# Patient Record
Sex: Female | Born: 1965 | Race: White | Hispanic: No | Marital: Married | Smoking: Never smoker
Health system: Southern US, Community
[De-identification: ages and names within clinical notes are randomized; demographics above are authoritative.]

## PROBLEM LIST (undated history)

## (undated) DIAGNOSIS — R1031 Right lower quadrant pain: Secondary | ICD-10-CM

## (undated) DIAGNOSIS — N83201 Unspecified ovarian cyst, right side: Secondary | ICD-10-CM

## (undated) HISTORY — PX: NASAL SINUS SURGERY: SHX719

## (undated) HISTORY — PX: OTHER SURGICAL HISTORY: SHX169

---
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
## 1988-09-06 HISTORY — PX: CHOLECYSTECTOMY: SHX55

## 1996-09-06 HISTORY — PX: DIAGNOSTIC LAPAROSCOPY: SUR761

## 1997-09-06 HISTORY — PX: VAGINAL HYSTERECTOMY: SUR661

## 1999-07-27 ENCOUNTER — Other Ambulatory Visit: Admission: RE | Admit: 1999-07-27 | Discharge: 1999-07-27 | Payer: Self-pay | Admitting: Obstetrics and Gynecology

## 1999-09-07 HISTORY — PX: PUBOVAGINAL SLING: SHX1035

## 2000-02-16 ENCOUNTER — Inpatient Hospital Stay (HOSPITAL_COMMUNITY): Admission: RE | Admit: 2000-02-16 | Discharge: 2000-02-17 | Payer: Self-pay | Admitting: Obstetrics and Gynecology

## 2001-05-25 ENCOUNTER — Encounter: Admission: RE | Admit: 2001-05-25 | Discharge: 2001-05-25 | Payer: Self-pay | Admitting: Otolaryngology

## 2001-05-25 ENCOUNTER — Encounter: Payer: Self-pay | Admitting: Otolaryngology

## 2002-09-06 HISTORY — PX: OTHER SURGICAL HISTORY: SHX169

## 2002-12-17 ENCOUNTER — Other Ambulatory Visit: Admission: RE | Admit: 2002-12-17 | Discharge: 2002-12-17 | Payer: Self-pay | Admitting: Obstetrics and Gynecology

## 2003-09-13 ENCOUNTER — Encounter: Admission: RE | Admit: 2003-09-13 | Discharge: 2003-09-13 | Payer: Self-pay | Admitting: Otolaryngology

## 2004-01-07 ENCOUNTER — Other Ambulatory Visit: Admission: RE | Admit: 2004-01-07 | Discharge: 2004-01-07 | Payer: Self-pay | Admitting: Obstetrics and Gynecology

## 2004-09-15 ENCOUNTER — Emergency Department (HOSPITAL_COMMUNITY): Admission: EM | Admit: 2004-09-15 | Discharge: 2004-09-15 | Payer: Self-pay | Admitting: Emergency Medicine

## 2005-04-22 ENCOUNTER — Encounter (INDEPENDENT_AMBULATORY_CARE_PROVIDER_SITE_OTHER): Payer: Self-pay | Admitting: Specialist

## 2005-04-22 ENCOUNTER — Ambulatory Visit (HOSPITAL_BASED_OUTPATIENT_CLINIC_OR_DEPARTMENT_OTHER): Admission: RE | Admit: 2005-04-22 | Discharge: 2005-04-22 | Payer: Self-pay | Admitting: Orthopedic Surgery

## 2005-04-22 ENCOUNTER — Ambulatory Visit (HOSPITAL_COMMUNITY): Admission: RE | Admit: 2005-04-22 | Discharge: 2005-04-22 | Payer: Self-pay | Admitting: Orthopedic Surgery

## 2006-08-25 ENCOUNTER — Emergency Department (HOSPITAL_COMMUNITY): Admission: EM | Admit: 2006-08-25 | Discharge: 2006-08-25 | Payer: Self-pay | Admitting: Emergency Medicine

## 2006-09-06 HISTORY — PX: OTHER SURGICAL HISTORY: SHX169

## 2007-09-07 HISTORY — PX: OTHER SURGICAL HISTORY: SHX169

## 2007-12-12 ENCOUNTER — Ambulatory Visit: Payer: Self-pay | Admitting: Family Medicine

## 2007-12-12 DIAGNOSIS — E669 Obesity, unspecified: Secondary | ICD-10-CM | POA: Insufficient documentation

## 2007-12-13 ENCOUNTER — Telehealth: Payer: Self-pay | Admitting: Family Medicine

## 2007-12-19 ENCOUNTER — Telehealth: Payer: Self-pay | Admitting: Family Medicine

## 2007-12-21 ENCOUNTER — Ambulatory Visit: Payer: Self-pay | Admitting: Family Medicine

## 2007-12-22 ENCOUNTER — Telehealth: Payer: Self-pay | Admitting: Family Medicine

## 2007-12-26 ENCOUNTER — Telehealth: Payer: Self-pay | Admitting: Family Medicine

## 2007-12-28 LAB — CONVERTED CEMR LAB
Basophils Absolute: 0.1 10*3/uL (ref 0.0–0.1)
Basophils Relative: 0.6 % (ref 0.0–1.0)
Eosinophils Absolute: 0.2 10*3/uL (ref 0.0–0.7)
Eosinophils Relative: 1.6 % (ref 0.0–5.0)
HCT: 43 % (ref 36.0–46.0)
Hemoglobin: 14.3 g/dL (ref 12.0–15.0)
Lymphocytes Relative: 27.5 % (ref 12.0–46.0)
MCHC: 33.3 g/dL (ref 30.0–36.0)
MCV: 89.9 fL (ref 78.0–100.0)
Monocytes Absolute: 1.1 10*3/uL — ABNORMAL HIGH (ref 0.1–1.0)
Monocytes Relative: 9.9 % (ref 3.0–12.0)
Neutro Abs: 6.6 10*3/uL (ref 1.4–7.7)
Neutrophils Relative %: 60.4 % (ref 43.0–77.0)
Platelets: 549 10*3/uL — ABNORMAL HIGH (ref 150–400)
RBC: 4.78 M/uL (ref 3.87–5.11)
RDW: 13.7 % (ref 11.5–14.6)
WBC: 11.1 10*3/uL — ABNORMAL HIGH (ref 4.5–10.5)

## 2008-01-01 ENCOUNTER — Telehealth: Payer: Self-pay | Admitting: Family Medicine

## 2008-01-03 ENCOUNTER — Encounter: Payer: Self-pay | Admitting: Family Medicine

## 2008-02-21 ENCOUNTER — Ambulatory Visit: Payer: Self-pay | Admitting: Family Medicine

## 2008-02-21 DIAGNOSIS — Z9884 Bariatric surgery status: Secondary | ICD-10-CM | POA: Insufficient documentation

## 2008-05-29 ENCOUNTER — Telehealth: Payer: Self-pay | Admitting: Family Medicine

## 2008-05-30 ENCOUNTER — Encounter: Payer: Self-pay | Admitting: Family Medicine

## 2008-07-29 ENCOUNTER — Encounter: Payer: Self-pay | Admitting: Family Medicine

## 2009-08-04 ENCOUNTER — Encounter: Payer: Self-pay | Admitting: Family Medicine

## 2010-07-15 ENCOUNTER — Telehealth: Payer: Self-pay | Admitting: Family Medicine

## 2010-10-04 LAB — CONVERTED CEMR LAB
ALT: 16 units/L (ref 0–35)
AST: 15 units/L (ref 0–37)
Albumin: 3.7 g/dL (ref 3.5–5.2)
Alkaline Phosphatase: 84 units/L (ref 39–117)
BUN: 11 mg/dL (ref 6–23)
Basophils Absolute: 0.1 10*3/uL (ref 0.0–0.1)
Basophils Relative: 0.9 % (ref 0.0–1.0)
Bilirubin, Direct: 0.1 mg/dL (ref 0.0–0.3)
CO2: 29 meq/L (ref 19–32)
Calcium: 9.2 mg/dL (ref 8.4–10.5)
Chloride: 107 meq/L (ref 96–112)
Cholesterol: 182 mg/dL (ref 0–200)
Creatinine, Ser: 0.8 mg/dL (ref 0.4–1.2)
Eosinophils Absolute: 0.1 10*3/uL (ref 0.0–0.7)
Eosinophils Relative: 0.9 % (ref 0.0–5.0)
GFR calc Af Amer: 102 mL/min
GFR calc non Af Amer: 84 mL/min
Glucose, Bld: 91 mg/dL (ref 70–99)
HCT: 42.5 % (ref 36.0–46.0)
HDL: 32 mg/dL — ABNORMAL LOW (ref >39.0)
Hemoglobin: 14.1 g/dL (ref 12.0–15.0)
LDL Cholesterol: 131 mg/dL — ABNORMAL HIGH (ref 0–99)
Lymphocytes Relative: 30.2 % (ref 12.0–46.0)
MCHC: 33.1 g/dL (ref 30.0–36.0)
MCV: 87.9 fL (ref 78.0–100.0)
Monocytes Absolute: 1.2 10*3/uL — ABNORMAL HIGH (ref 0.1–1.0)
Monocytes Relative: 9.9 % (ref 3.0–12.0)
Neutro Abs: 7.3 10*3/uL (ref 1.4–7.7)
Neutrophils Relative %: 58.1 % (ref 43.0–77.0)
Phosphorus: 4.2 mg/dL (ref 2.3–4.6)
Platelets: 527 10*3/uL — ABNORMAL HIGH (ref 150–400)
Potassium: 5 meq/L (ref 3.5–5.1)
RBC: 4.84 M/uL (ref 3.87–5.11)
RDW: 13.9 % (ref 11.5–14.6)
Sodium: 140 meq/L (ref 135–145)
TSH: 1.16 microintl units/mL (ref 0.35–5.50)
Total Bilirubin: 0.7 mg/dL (ref 0.3–1.2)
Total CHOL/HDL Ratio: 5.7
Total Protein: 7.1 g/dL (ref 6.0–8.3)
Triglycerides: 94 mg/dL (ref 0–149)
VLDL: 19 mg/dL (ref 0–40)
WBC: 12.5 10*3/uL — ABNORMAL HIGH (ref 4.5–10.5)

## 2010-10-06 NOTE — Progress Notes (Signed)
Summary: exemption flu inj   Phone Note Call from Patient   Caller: Patient   Summary of Call: pls call her at [phone] she works at [employer] and needs flu shot exemption Initial call taken by: Pura Spice, RN,  July 15, 2010 9:13 AM  Follow-up for Phone Call        I cannot do this since I  have never met her. She can resubmit the letter Dr. Scotty Court dictated for her in 2009.  Follow-up by: Nelwyn Salisbury MD,  July 15, 2010 10:58 AM  Additional Follow-up for Phone Call Additional follow up Details #1::        left mess for pt to return call  Additional Follow-up by: Pura Spice, RN,  July 15, 2010 11:01 AM    Additional Follow-up for Phone Call Additional follow up Details #2::    pt notified and mess given to her stated she would ask someone else in HR if can use the same letter.  Pt advised to call back if we can assit her anymore.  Follow-up by: Pura Spice, RN,  July 15, 2010 5:16 PM

## 2010-10-06 NOTE — Letter (Signed)
[facility]: [facility]   Imported By: Maryln Gottron 09/10/2009 13:58:05  _____________________________________________________________________  External Attachment:    Type:   Image     Comment:   External Document

## 2011-01-22 NOTE — H&P (Signed)
[facility]  Patient:    Sydney [name], Sydney [name]                       MRN: [id number] Dictator:   Juluis Mire, M.D.                         History and Physical  HISTORY OF PRESENT ILLNESS:  This is a 45 year old gravida 2 para 3 white female presents for open laparoscopy and attempted cuff revision per management of deep dyspareunia.  In relation to the present admission, patient undergone a previous total vaginal hysterectomy in December of 1998 with a finding of uterine adenomyosis.  She was doing well after that and developed increasing deep dyspareunia.  Ultrasound evaluation was basically unremarkable.  She continued to have cuff tenderness located along the top of the vagina.  We did inject his area with Marcaine and dexamethasone without response.  She has been evaluated by a urologist who felt that she did not have interstitial cystitis. She does have stress urinary incontinence at the present time.  Dr. Logan Bores is planning to do a sling procedure.  In view of her continued cuff tenderness, we are going to do a diagnostic laparoscopy in an attempt at a cuff revision.  ALLERGIES:  CODEINE.  MEDICATIONS:  None.  PAST MEDICAL HISTORY:  Old childhood diseases.  She has been diagnosed with fibromyalgia in the past.  PAST SURGICAL HISTORY:  She had a splenectomy at age 67.  She had a cholecystectomy in the recent past.  She has had wrist, elbow and sinus surgery.  Also in October of 1997, she underwent diagnostic laparoscopy and subsequently had a noted total vaginal hysterectomy.  FAMILY HISTORY:  Noncontributory.  SOCIAL HISTORY:  Noncontributory.  REVIEW OF SYSTEMS:  Noncontributory.  PHYSICAL EXAMINATION:  VITAL SIGNS:  ______  HEENT:  Normocephalic.  Pupils equal, round and reactive to accommodation. Extraocular movements are intact.  Sclera and conjunctiva clear.  Oropharynx clear without thyromegaly.  BREASTS:  Nodes, good  masses.  LUNGS:  Clear.  CARDIAC:  Regular rhythm and rate without murmurs or gallops.  ABDOMEN:  Benign.  PELVIC:  Normal external genitalia.  Vaginal mucosa is clear.  Cuff well healed.  Does have diffuse cuff tenderness and bladder base tenderness.  No palpable abnormalities are noted.  RECTOVAGINAL:  Exam not performed.  EXTREMITIES:  Trace edema.  NEUROLOGICAL:  ______  IMPRESSION:  Deep dyspareunia at vaginal cuff.  PLAN:  The patient will undergo open laparoscopy as well as attempt at cuff revision, depending upon the findings.  The risks of surgery have been discussed, including the persistence of pain during intercourse, the risk of infection, the risk of vascular injury that could lead to hemorrhage requiring transfusion with the risk of AIDS or hepatitis, the risk of injury to adjacent organs, such as bladder or bowel that could require further exploratory surgery, risks of deep vein thrombosis and pulmonary emboli.  The patient expresses understanding and indication of risks. DD:  02/16/00 TD:  02/16/00 Job: [id number]

## 2011-01-22 NOTE — Op Note (Signed)
[facility]  Patient:    Sydney [name], Sydney [name]                     MRN: [id number] Proc. Date: 02/16/00 Adm. Date:  [id number] Disc. Date: [id number] Attending:  Londell Moh CC:         Juluis Mire, M.D.             Jamison Neighbor, M.D.                           Operative Report  PREOPERATIVE DIAGNOSIS:  Stress urinary incontinence.  POSTOPERATIVE DIAGNOSES:  Stress urinary incontinence and cystocele.  PROCEDURE:  Cystoscopy, suprapubic tube placement, anterior repair, and pubovaginal sling.  SURGEON:  Jamison Neighbor, M.D.  ANESTHESIA:  General.  COMPLICATIONS:  None.  DRAINS:  Bonnano Cystocath.  BRIEF HISTORY:  A 45 year old female who has had problems with chronic pelvic pain.  Dr. Arelia Sneddon referred her for evaluation for interstitial cystitis.  The potassium testing was negative.  Cystoscopic examination also did not reveal any pain with filling.  The patient was found to have stress urinary incontinence with urethral hypermobility.  She is scheduled to undergo revision of her vaginal cuff, which appears to be the source of her vaginal pain, and has requested that an incontinence procedure be done at the same time, and the patient is to undergo a pubovaginal sling.  She understands the risks and benefits of the procedure, including the possibility she will have postoperative urgency that may require medication.  She also realizes she may need additional surgery to either loosen or tighten the sling depending upon the postoperative outcome.  The patient gave full and informed consent.  DESCRIPTION OF PROCEDURE:  After successful induction of general anesthesia, the patient was placed in the dorsal lithotomy position, prepped with Betadine, and draped in the usual sterile fashion.  The patient underwent a laparoscopic lysis of adhesions followed by vaginal cuff revision by Dr. Arelia Sneddon.  Once that was completed, a weighted vaginal speculum  was placed and the labia were sutured out to the medial thigh.  A Foley catheter was inserted, and the anterior vaginal mucosa was infiltrated with local anesthesia.  A small incision was made directly above the pubic bone and was carried down through Scarpas fascia until the pubis was identified.  This was just barely wider than the width of an index finger.  Hemostasis was obtained with the electrocautery.  A vaginal incision was then made from the area of the midurethra all the way back toward the vaginal cuff.  Flaps were raised bilaterally, allowing entry into the space of Retzius.  Dissection proceeded posteriorly until the space of Retzius was entered and mobilized.  The patient had an obvious cystocele, which was repaired with a series of horizontal mattress sutures of 2-0 Vicryl, nicely plicating the fascia.  The sling was then made of 4 x 7 Tutoplast.  This was cut so that it was formed into a T with the center section extending the full 4 cm between, 1.5 and 2 cm wide, and then two extensions that were doubled up to form the sling proper.  The ends were oversewn with #1 nylon.  The sling was placed in position and then was brought up to the abdominal incision with ligature carriers.  The cystoscope was inserted, the bladder was carefully inspected.  It was free of any tumor or stones.  Both ureteral  orifices were normal in configuration and location.  Clear urine was seen to efflux from each.  The sling material and the sutures had not entered the bladder in any way.  The sling was then tacked in place so that it would span from the midurethral complex back toward the bladder neck and was tacked down so that there was additional coverage to protect and reinforce the midline repair.  This nicely took care of both the central defect and lateral defect.  The vaginal mucosa was then trimmed and closed with a running suture of 2-0 Vicryl.  The weighted vaginal speculum was placed, and  the radial sutures were removed.  Under cystoscopic guidance, the sutures were tied down.  They were tied with nice coaptation of the urethra but no angulation.  The cystoscope still had 30 degrees of play and was in a neutral position with no excessive angulation.  The bladder was filled, and the Crede maneuver allowed the prompt flow of urine, but otherwise there was no flow, and there were still finger breadths we placed underneath the sutures in the abdominal incision.  This was then tied down.  The abdominal incision was irrigated and was closed with a 2-0 Vicryl figure-of-eight and insertable clips.  The patient had a nice elevation of the cystocele without excessive angulation noted.  The patient had vaginal packing placed.  The patient had a Bonnano Cystocath that was inserted, but during the procedure through the old operative port, which was moved so that it would overly the dilated bladder, the SP tube was sutured down with 3-0 nylon sutures and a dressing was then applied.  The patient tolerated the procedure well and was taken to the recovery room in good condition. DD:  02/16/00 TD:  02/18/00 Job: [id number]

## 2011-01-22 NOTE — Discharge Summary (Signed)
[facility]  Patient:    Sydney [name], Sydney [name]                     MRN: [id number] Adm. Date:  [id number] Disc. Date: [id number] Attending:  Londell Moh Dictator:   Jamison Neighbor, M.D. CC:         Juluis Mire, M.D.                           Discharge Summary  ADMISSION DIAGNOSIS:  Female stress incontinence and prolapse of vaginal wall.  SECONDARY DIAGNOSES: 1.  Dyspareunia. 2.  Fibromyalgia. 3.  Peritoneal adhesions.  PROCEDURE:  Vaginal reconstruction by Dr. Richardean Chimera, cystocele repair and pubovaginal sling by Dr. Logan Bores.  HISTORY OF PRESENT ILLNESS:  This 45 year old married white female scheduled to undergo laparoscopy for cuff revision for management of dyspareunia in a patient status post vaginal hysterectomy in 1998 and developed problems with dyspareunia.  Ultrasound evaluation was normal, but the patient did have an area of cuff tenderness along the top of the vagina.  In addition, the patient has stress incontinence.  She is to be admitted following laparoscopy, repair of the cuff and sling placement.  PAST MEDICAL HISTORY:  Unremarkable.  She does have a history of fibromyalgia.  PAST SURGICAL HISTORY:  Splenectomy, cholecystectomy, wrist and elbow surgery, sinus surgery and vaginal hysterectomy.  FAMILY HISTORY:  Non-contributory.  SOCIAL HISTORY:  Non-contributory.  REVIEW OF SYSTEMS:  Non-contributory.  HOSPITAL COURSE:  On February 16, 2000, the patient underwent revision of the cuff by Dr. Richardean Chimera following laparoscopy and lysis of adhesions.  In addition, she underwent a cystoscopy, suprapubic tube placement, anterior repair and pubovaginal sling.  Her postoperative course was unremarkable.  She had mild nausea which eventually cleared.  On postoperative day number one, the patient was taught how to use the suprapubic tube.  She was given a prescription for Lorcet Plus to take as needed for pain, Phenergan as needed  for nausea and Macrodantin 100 mg daily.  She was taught how to use the tube for management of her postvoid residuals and return to the office in one weeks time for follow-up. DD:  03/01/00 TD:  03/01/00 Job: [id number]

## 2011-01-22 NOTE — Op Note (Signed)
[name], Sydney [name]              ACCOUNT NO.:  [id number]   MEDICAL RECORD NO.:  [id number]          PATIENT TYPE:  AMB   LOCATION:  [facility]                         FACILITY:  [facility]   PHYSICIAN:  Marlowe Kays, M.D.  DATE OF BIRTH:  1966/07/20   DATE OF PROCEDURE:  04/22/2005  DATE OF DISCHARGE:                                 OPERATIVE REPORT   PREOPERATIVE DIAGNOSES:  Painful mass right palm.   POSTOPERATIVE DIAGNOSES:  Suspected lipoma right palm.   OPERATION:  Exploration right palm with excision of lipoma.   SURGEON:  Marlowe Kays, M.D.   ASSISTANT:  Nurse.   ANESTHESIA:  General.   PATHOLOGY AND JUSTIFICATION FOR PROCEDURE:  She has had a progressively  growing mass in her right palm with symptoms of carpal tunnel syndrome but  nerve conduction studies have been normal. She is here today because of the  progressive nature of the growth and her pain. See operative description  below for additional pathology.   DESCRIPTION OF PROCEDURE:  Satisfactory general anesthesia, pneumatic  tourniquet, right upper extremity was esmarched out nonsterilely and prepped  from mid forearm to fingertips with DuraPrep and draped in a sterile field.  I made a hockey stick type incision basically following the mid palmar  crease overlying the mass and then going into the carpal tunnel canal line.  I elevated up the subcutaneous tissue and noted a yellowish mass present  with nerve tissue bolstering over it. With gentle and cautious dissection  using blunt instrumentation mainly, I gradually dissected out the mass which  seemed to take origin from the intrinsic muscles of the hand and the nerve  once again was bolstering over it and I dissected it off from the nerve  finally following it into the distal palm where it seemed to originate from  the side of one of the intrinsic muscles. The mass was excised in toto and  measured 4 x 2-1/2 cm and was sent to pathology. There was no apparent  iatrogenic nerve injury. The wound was irrigated with sterile saline, the  soft tissue was infiltrated 1/2% plain Marcaine and the wound closed with  interrupted 4-0 nylon mattress sutures. Betadine Adaptic dry sterile  dressing and a compressive dressing were applied. The tourniquet was  released. She tolerated the procedure well and at the time of this dictation  was on her way to the recovery room in satisfactory condition with no known  complications.           ______________________________  Marlowe Kays, M.D.     JA/MEDQ  D:  04/22/2005  T:  04/22/2005  Job:  [id number]

## 2012-02-16 ENCOUNTER — Encounter (HOSPITAL_BASED_OUTPATIENT_CLINIC_OR_DEPARTMENT_OTHER): Payer: Self-pay | Admitting: *Deleted

## 2012-02-16 LAB — CBC
Hemoglobin: 12.1 g/dL (ref 12.0–15.0)
RBC: 4.16 MIL/uL (ref 3.87–5.11)

## 2012-02-16 NOTE — Progress Notes (Signed)
NPO AFTER MN. ARRIVES AT 0615. CBC TO BE DONE TODAY ABOUT 1430.

## 2012-02-16 NOTE — Anesthesia Preprocedure Evaluation (Addendum)
Anesthesia Evaluation  Patient identified by MRN, date of birth, ID band Patient awake    Reviewed: Allergy & Precautions, H&P , NPO status , Patient's Chart, lab work & pertinent test results  Airway Mallampati: I TM Distance: >3 FB Neck ROM: Full    Dental  (+) Teeth Intact and Dental Advisory Given   Pulmonary neg pulmonary ROS,  breath sounds clear to auscultation  Pulmonary exam normal       Cardiovascular negative cardio ROS  Rhythm:Regular     Neuro/Psych negative neurological ROS  negative psych ROS   GI/Hepatic Neg liver ROS, Prior mini gastric bypass/bariatric procedure   Endo/Other  negative endocrine ROS  Renal/GU negative Renal ROS  negative genitourinary   Musculoskeletal negative musculoskeletal ROS (+)   Abdominal   Peds negative pediatric ROS (+)  Hematology negative hematology ROS (+)   Anesthesia Other Findings   Reproductive/Obstetrics negative OB ROS                          Anesthesia Physical Anesthesia Plan  ASA: II  Anesthesia Plan: General   Post-op Pain Management:    Induction: Intravenous  Airway Management Planned: Oral ETT  Additional Equipment:   Intra-op Plan:   Post-operative Plan: Extubation in OR  Informed Consent:   Dental advisory given  Plan Discussed with: CRNA  Anesthesia Plan Comments:         Anesthesia Quick Evaluation

## 2012-02-16 NOTE — H&P (Signed)
Sydney [name] is an 46 y.o. female. Presents for diagnostic laparoscopy and possible removal of rght  Ovary.  Paatient has had previous  TVH for adenomyosis in 1999.  Because of dyspareunia had laparoscopic lysis or adhesions and revision of vaginal cuff.  She had recurrent dyspareunia and serial sonograms  Have revealed and persistent cystic enlargement of the right ovary measuring 5.4*4.3*5.2 cm .  CA 125 is normal. Cyst in simple with no blood flow to the cyst wall.  No free fluid. Pertinent Gynecological History: Menses: prior total vaginal hysterectomy Bleeding: na Contraception: none DES exposure: denies Blood transfusions: none Sexually transmitted diseases: no past history Previous GYN Procedures: Diagnostic Laparoscopy for pain.  Total vaginal hysterectomy. Laparoscopy with lysis of adhesions and revesion of vaginal cuff.  Last mammogram: normal Date: unknown Last pap: normal OB History: G3, P3   Menstrual History: Menarche age:   No LMP recorded.    Past medical history: Usual childhood illness no sequel.   History of fibromyalgia.  Surgical history:  Splenectomy at age 46.  Cholecystectomy.  Wrist elbow and sinus surgery. And gyn  Procedures as noted above.  Family history: nooncontributory.  Social History:  does not have a smoking history on file. She does not have any smokeless tobacco history on file. Her alcohol and drug histories not on file.  Allergies:  Allergies  Allergen Reactions  . Codeine     No prescriptions prior to admission    Review of Systems  All other systems reviewed and are negative.    There were no vitals taken for this visit. Physical Exam  Constitutional: She is oriented to person, place, and time. She appears well-developed and well-nourished.  HENT:  Head: Normocephalic.  Eyes: Conjunctivae are normal. Pupils are equal, round, and reactive to light.  Neck: Normal range of motion. Neck supple.  Cardiovascular: Normal rate,  regular rhythm and normal heart sounds.   Respiratory: Effort normal and breath sounds normal.  GI: Soft. Bowel sounds are normal. She exhibits no mass. There is no tenderness.  Genitourinary:       Normal external genitalia, vagina clear. Cuff intact.  Pelvic right side fullness and tenderness  Musculoskeletal: Normal range of motion. She exhibits no edema.  Neurological: She is alert and oriented to person, place, and time. She has normal reflexes.    No results found for this or any previous visit (from the past 24 hour(s)).  No results found.  Assessment/Plan: Present cystic enlargement of Right ovary. Will Procede with diagnostic laparoscopy with possible removal of right ovary.  Risk of surgery explained. Including. Infection, Hemorrhage that could require transfusions with risk of aids or hepatitis. Injury to adjacent organs such as bowel bladder or ureters that could require repeat surgery to repair.  Risk of DVT and pulmonary embolism. Patient expresses understanding of indications and risks.   Chief Walkup S 02/16/2012, 5:21 AM

## 2012-02-17 ENCOUNTER — Encounter (HOSPITAL_BASED_OUTPATIENT_CLINIC_OR_DEPARTMENT_OTHER): Payer: Self-pay | Admitting: *Deleted

## 2012-02-17 ENCOUNTER — Encounter (HOSPITAL_BASED_OUTPATIENT_CLINIC_OR_DEPARTMENT_OTHER)
Admission: RE | Disposition: A | Discharge status: Home / Self Care | Payer: Self-pay | Source: Ambulatory Visit | Attending: Obstetrics and Gynecology

## 2012-02-17 ENCOUNTER — Ambulatory Visit (HOSPITAL_BASED_OUTPATIENT_CLINIC_OR_DEPARTMENT_OTHER): Payer: 59 | Admitting: Anesthesiology

## 2012-02-17 ENCOUNTER — Encounter (HOSPITAL_BASED_OUTPATIENT_CLINIC_OR_DEPARTMENT_OTHER): Payer: Self-pay | Admitting: Anesthesiology

## 2012-02-17 ENCOUNTER — Ambulatory Visit (HOSPITAL_BASED_OUTPATIENT_CLINIC_OR_DEPARTMENT_OTHER)
Admission: RE | Admit: 2012-02-17 | Discharge: 2012-02-17 | Disposition: A | Discharge status: Home / Self Care | Payer: 59 | Source: Ambulatory Visit | Attending: Obstetrics and Gynecology | Admitting: Obstetrics and Gynecology

## 2012-02-17 DIAGNOSIS — N83209 Unspecified ovarian cyst, unspecified side: Secondary | ICD-10-CM | POA: Insufficient documentation

## 2012-02-17 DIAGNOSIS — Z9884 Bariatric surgery status: Secondary | ICD-10-CM

## 2012-02-17 DIAGNOSIS — E669 Obesity, unspecified: Secondary | ICD-10-CM

## 2012-02-17 DIAGNOSIS — IMO0001 Reserved for inherently not codable concepts without codable children: Secondary | ICD-10-CM | POA: Insufficient documentation

## 2012-02-17 DIAGNOSIS — N83201 Unspecified ovarian cyst, right side: Secondary | ICD-10-CM

## 2012-02-17 DIAGNOSIS — Z9089 Acquired absence of other organs: Secondary | ICD-10-CM | POA: Insufficient documentation

## 2012-02-17 DIAGNOSIS — Z90710 Acquired absence of both cervix and uterus: Secondary | ICD-10-CM | POA: Insufficient documentation

## 2012-02-17 HISTORY — DX: Unspecified ovarian cyst, right side: N83.201

## 2012-02-17 HISTORY — DX: Right lower quadrant pain: R10.31

## 2012-02-17 HISTORY — PX: CYSTOSCOPY: SHX5120

## 2012-02-17 SURGERY — LAPAROSCOPIC OVARIAN
Anesthesia: General | Site: Bladder | Laterality: Right | Wound class: Clean

## 2012-02-17 MED ORDER — GLYCOPYRROLATE 0.2 MG/ML IJ SOLN
INTRAMUSCULAR | Status: DC | PRN
  Administered 2012-02-17: 0.6 mg via INTRAVENOUS

## 2012-02-17 MED ORDER — ONDANSETRON HCL 4 MG/2ML IJ SOLN
INTRAMUSCULAR | Status: DC | PRN
  Administered 2012-02-17: 4 mg via INTRAVENOUS

## 2012-02-17 MED ORDER — SUCCINYLCHOLINE CHLORIDE 20 MG/ML IJ SOLN
INTRAMUSCULAR | Status: DC | PRN
  Administered 2012-02-17: 100 mg via INTRAVENOUS

## 2012-02-17 MED ORDER — LACTATED RINGERS IV SOLN
INTRAVENOUS | Status: DC
  Administered 2012-02-17 (×2): via INTRAVENOUS

## 2012-02-17 MED ORDER — DEXAMETHASONE SODIUM PHOSPHATE 4 MG/ML IJ SOLN
INTRAMUSCULAR | Status: DC | PRN
  Administered 2012-02-17: 10 mg via INTRAVENOUS

## 2012-02-17 MED ORDER — LACTATED RINGERS IR SOLN
Status: DC | PRN
  Administered 2012-02-17: 3000 mL

## 2012-02-17 MED ORDER — INDIGOTINDISULFONATE SODIUM 8 MG/ML IJ SOLN
INTRAMUSCULAR | Status: DC | PRN
  Administered 2012-02-17: 5 mL via INTRAVENOUS

## 2012-02-17 MED ORDER — LIDOCAINE HCL (CARDIAC) 20 MG/ML IV SOLN
INTRAVENOUS | Status: DC | PRN
  Administered 2012-02-17: 60 mg via INTRAVENOUS

## 2012-02-17 MED ORDER — SODIUM CHLORIDE 0.9 % IR SOLN
Status: DC | PRN
  Administered 2012-02-17: 500 mL

## 2012-02-17 MED ORDER — MIDAZOLAM HCL 5 MG/5ML IJ SOLN
INTRAMUSCULAR | Status: DC | PRN
  Administered 2012-02-17: 2 mg via INTRAVENOUS

## 2012-02-17 MED ORDER — BUPIVACAINE HCL 0.25 % IJ SOLN
INTRAMUSCULAR | Status: DC | PRN
  Administered 2012-02-17: 16 mL

## 2012-02-17 MED ORDER — NEOSTIGMINE METHYLSULFATE 1 MG/ML IJ SOLN
INTRAMUSCULAR | Status: DC | PRN
  Administered 2012-02-17: 4 mg via INTRAVENOUS

## 2012-02-17 MED ORDER — FENTANYL CITRATE 0.05 MG/ML IJ SOLN
INTRAMUSCULAR | Status: DC | PRN
  Administered 2012-02-17: 100 ug via INTRAVENOUS
  Administered 2012-02-17: 50 ug via INTRAVENOUS

## 2012-02-17 MED ORDER — LACTATED RINGERS IV SOLN: INTRAVENOUS | Status: DC

## 2012-02-17 MED ORDER — KETOROLAC TROMETHAMINE 30 MG/ML IJ SOLN
INTRAMUSCULAR | Status: DC | PRN
  Administered 2012-02-17: 30 mg via INTRAVENOUS

## 2012-02-17 MED ORDER — PROMETHAZINE HCL 25 MG/ML IJ SOLN: 6.2500 mg | INTRAMUSCULAR | Status: DC | PRN

## 2012-02-17 MED ORDER — PROPOFOL 10 MG/ML IV EMUL
INTRAVENOUS | Status: DC | PRN
  Administered 2012-02-17: 200 mg via INTRAVENOUS

## 2012-02-17 MED ORDER — FENTANYL CITRATE 0.05 MG/ML IJ SOLN
25.0000 ug | INTRAMUSCULAR | Status: DC | PRN
  Administered 2012-02-17 (×3): 25 ug via INTRAVENOUS

## 2012-02-17 MED ORDER — HYDROMORPHONE HCL 2 MG PO TABS: 2.0000 mg | ORAL_TABLET | Freq: Four times a day (QID) | ORAL | Status: AC | PRN

## 2012-02-17 MED ORDER — ROCURONIUM BROMIDE 100 MG/10ML IV SOLN
INTRAVENOUS | Status: DC | PRN
  Administered 2012-02-17: 25 mg via INTRAVENOUS
  Administered 2012-02-17: 5 mg via INTRAVENOUS

## 2012-02-17 SURGICAL SUPPLY — 54 items
ADH SKN CLS APL DERMABOND .7 (GAUZE/BANDAGES/DRESSINGS) ×2
APPLICATOR COTTON TIP 6IN STRL (MISCELLANEOUS) ×3 IMPLANT
BAG SPEC RTRVL LRG 6X4 10 (ENDOMECHANICALS)
BAG URINE DRAINAGE (UROLOGICAL SUPPLIES) IMPLANT
BANDAGE ADHESIVE 1X3 (GAUZE/BANDAGES/DRESSINGS) IMPLANT
BLADE SURG 11 STRL SS (BLADE) ×3 IMPLANT
CANISTER SUCTION 1200CC (MISCELLANEOUS) IMPLANT
CANISTER SUCTION 2500CC (MISCELLANEOUS) IMPLANT
CATH FOLEY 2WAY SLVR  5CC 14FR (CATHETERS)
CATH FOLEY 2WAY SLVR 5CC 14FR (CATHETERS) IMPLANT
CATH ROBINSON RED A/P 16FR (CATHETERS) ×1 IMPLANT
CLOTH BEACON ORANGE TIMEOUT ST (SAFETY) ×3 IMPLANT
DERMABOND ADVANCED (GAUZE/BANDAGES/DRESSINGS) ×1
DERMABOND ADVANCED .7 DNX12 (GAUZE/BANDAGES/DRESSINGS) ×2 IMPLANT
DRAPE CAMERA CLOSED 9X96 (DRAPES) ×3 IMPLANT
DRESSING TELFA 8X3 (GAUZE/BANDAGES/DRESSINGS) IMPLANT
ELECT REM PT RETURN 9FT ADLT (ELECTROSURGICAL) ×3
ELECTRODE REM PT RTRN 9FT ADLT (ELECTROSURGICAL) ×2 IMPLANT
FILTER SMOKE EVAC LAPAROSHD (FILTER) ×1 IMPLANT
GLOVE BIO SURGEON STRL SZ7 (GLOVE) ×6 IMPLANT
GLOVE BIOGEL PI IND STRL 7.0 (GLOVE) IMPLANT
GLOVE BIOGEL PI INDICATOR 7.0 (GLOVE) ×1
GLOVE INDICATOR 6.5 STRL GRN (GLOVE) ×2 IMPLANT
GOWN PREVENTION PLUS LG XLONG (DISPOSABLE) ×6 IMPLANT
IV LACTATED RINGER IRRG 3000ML (IV SOLUTION) ×3
IV LR IRRIG 3000ML ARTHROMATIC (IV SOLUTION) IMPLANT
LAPAROSCOPY HANDPIECE LONG (MISCELLANEOUS) IMPLANT
NDL INSUFFLATION 14GA 120MM (NEEDLE) IMPLANT
NEEDLE INSUFFLATION 14GA 120MM (NEEDLE) IMPLANT
NS IRRIG 500ML POUR BTL (IV SOLUTION) ×3 IMPLANT
PACK BASIN DAY SURGERY FS (CUSTOM PROCEDURE TRAY) ×3 IMPLANT
PACK LAPAROSCOPY II (CUSTOM PROCEDURE TRAY) ×3 IMPLANT
PAD OB MATERNITY 4.3X12.25 (PERSONAL CARE ITEMS) ×3 IMPLANT
PAD PREP 24X48 CUFFED NSTRL (MISCELLANEOUS) ×3 IMPLANT
POUCH SPECIMEN RETRIEVAL 10MM (ENDOMECHANICALS) IMPLANT
SCISSORS LAP 5X35 DISP (ENDOMECHANICALS) IMPLANT
SEALER TISSUE G2 CVD JAW 35 (ENDOMECHANICALS) IMPLANT
SEALER TISSUE G2 CVD JAW 45CM (ENDOMECHANICALS) ×1
SET IRRIG TUBING LAPAROSCOPIC (IRRIGATION / IRRIGATOR) IMPLANT
SET IRRIG Y TYPE TUR BLADDER L (SET/KITS/TRAYS/PACK) ×1 IMPLANT
SOLUTION ANTI FOG 6CC (MISCELLANEOUS) ×3 IMPLANT
SUT VIC AB 3-0 PS2 18 (SUTURE) ×6
SUT VIC AB 3-0 PS2 18XBRD (SUTURE) ×2 IMPLANT
SUT VICRYL 0 ENDOLOOP (SUTURE) IMPLANT
SUT VICRYL 0 UR6 27IN ABS (SUTURE) ×1 IMPLANT
SYRINGE 10CC LL (SYRINGE) IMPLANT
TOWEL OR 17X24 6PK STRL BLUE (TOWEL DISPOSABLE) ×6 IMPLANT
TRAY DSU PREP LF (CUSTOM PROCEDURE TRAY) ×3 IMPLANT
TROCAR BALLN 12MMX100 BLUNT (TROCAR) ×1 IMPLANT
TROCAR Z-THREAD BLADED 11X100M (TROCAR) ×3 IMPLANT
TROCAR Z-THREAD BLADED 5X100MM (TROCAR) ×3 IMPLANT
TUBING INSUFFLATION W/FILTER (TUBING) ×3 IMPLANT
VACUUM HOSE/TUBING 7/8INX6FT (MISCELLANEOUS) ×1 IMPLANT
WATER STERILE IRR 500ML POUR (IV SOLUTION) ×3 IMPLANT

## 2012-02-17 NOTE — Brief Op Note (Signed)
02/17/2012  8:41 AM  PATIENT:  Valda Lamb  46 y.o. female  PRE-OPERATIVE DIAGNOSIS:  RIGHT OVARIAN CYST  POST-OPERATIVE DIAGNOSIS:  RIGHT OVARIAN CYST  PROCEDURE:  Procedure(s) (LRB): LAPAROSCOPIC OVARIAN (Right)  SURGEON:  Surgeon(s) and Role:    * Juluis Mire, MD - Primary  PHYSICIAN ASSISTANT:   ASSISTANTS: none   ANESTHESIA:   general  EBL:  Total I/O In: 1000 [I.V.:1000] Out: -   BLOOD ADMINISTERED:none  DRAINS: none   LOCAL MEDICATIONS USED:  MARCAINE     SPECIMEN:  Source of Specimen:  right tube and ovary  DISPOSITION OF SPECIMEN:  PATHOLOGY  COUNTS:  YES  TOURNIQUET:  * No tourniquets in log *  DICTATION: .Other Dictation: Dictation Number [id number]  PLAN OF CARE: Discharge to home after PACU  PATIENT DISPOSITION:  PACU - hemodynamically stable.   Delay start of Pharmacological VTE agent (>24hrs) due to surgical blood loss or risk of bleeding: no

## 2012-02-17 NOTE — Anesthesia Postprocedure Evaluation (Signed)
Anesthesia Post Note  Patient: Sydney [name]  Procedure(s) Performed: Procedure(s) (LRB): LAPAROSCOPIC OVARIAN (Right) CYSTOSCOPY ()  Anesthesia type: General  Patient location: PACU  Post pain: Pain level controlled  Post assessment: Post-op Vital signs reviewed  Last Vitals:  Filed Vitals:   02/17/12 0900  BP: 108/65  Pulse: 65  Temp:   Resp: 16    Post vital signs: Reviewed  Level of consciousness: sedated  Complications: No apparent anesthesia complications

## 2012-02-17 NOTE — Discharge Instructions (Signed)
HOME CARE INSTRUCTIONS - LAPAROSCOPY  Wound Care:   The band aids or dressings which are placed on the skin opening may be removed the day after surgery.  The incision should be kept clean and dry.  The stitches do not need to be removed.  Should the incision become sore, red and swollen after the first week, check with your doctor.  Persona Hygiene: Shower or tub bathe the day after your procedure.  Always wipe from front to back after elimination.  Activity: Do not drive or operate any equipment today.  The effects of the anesthesia are sill present and drowsiness may result.  Rest today - not necessarily flat bed rest.  Just take it easy.  You may resume your normal activity in two to three days or as instructed by your physician.  Sexual Activity: You may resume sexual activity as indicated by your physician.  If your laparoscopy was for a sterilization (tubes tied), continue current method of birth control until after your next period or ask for specific instructions from your doctor.  Diet: Eat a light diet as desired this evening.  You may resume a regular diet tomorrow.   Return to Work: You may return to work in tow or three days, or as indicated by Designer, multimedia.  General Expectations of Your Surgery: Your surgery will cause vaginal drainage or spotting which may continue for 2-3 days.  Mild abdominal discomfort or tenderness is not unusual and some shoulder pain may also be noted, which can be relieved by lying flat in bed.  Unexpected Observations - Call Your Doctor if These Occur:  -persistent or heavy bleeding at incision site  -redness or swellling around incision  -elevation of temperature greater than 100 degrees Farenheit.  Return to your Doctor's Office:  *** Call to set up an appointment.  Patient's Signature:  _______________________________________________________  Nurse's Signature:  ________________________________________________________         CYSTOSCOPY HOME CARE INSTRUCTIONS  Activity: Rest for the remainder of the day.  Do not drive or operate equipment today.  You may resume normal activities in one to two days as instructed by your physician.   Meals: Drink plenty of liquids and eat light foods such as gelatin or soup this evening.  You may return to a normal meal plan tomorrow.  Return to Work: You may return to work in one to two days or as instructed by your physician.  Special Instructions / Symptoms: Call your physician if any of these symptoms occur:   -persistent or heavy bleeding  -bleeding which continues after first few urination  -large blood clots that are difficult to pass  -urine stream diminishes or stops completely  -fever equal to or higher than 101 degrees Farenheit.  -cloudy urine with a strong, foul odor  -severe pain  Females should always wipe from front to back after elimination.  You may feel some burning pain when you urinate.  This should disappear with time.  Applying moist heat to the lower abdomen or a hot tub bath may help relieve the pain. \  Follow-Up / Date of Return Visit to Your Physician:  *** Call for an appointment to arrange follow-up.  Patient Signature:  ________________________________________________________  Nurse's Signature:  ________________________________________________________       Post Anesthesia Home Care Instructions   Activity: Get plenty of rest for the remainder of the day. A responsible adult should stay with you for 24 hours following the procedure.  For the  next 24 hours, DO NOT: -Drive a car -Advertising copywriter -Drink alcoholic beverages -Take any medication unless instructed by your physician -Make any legal decisions or sign important papers.  Meals: Start with liquid foods such as gelatin or soup. Progress to regular foods as tolerated. Avoid greasy, spicy, heavy foods. If nausea and/or vomiting occur, drink only clear liquids until the  nausea and/or vomiting subsides. Call your physician if vomiting continues.  Special Instructions/Symptoms: Your throat may feel dry or sore from the anesthesia or the breathing tube placed in your throat during surgery. If this causes discomfort, gargle with warm salt water. The discomfort should disappear within 24 hours.

## 2012-02-17 NOTE — Op Note (Signed)
Patient name  Sydney [name], [place] DICTATION#  [id number] CSN# [id number]  Juluis Mire, MD 02/17/2012 8:42 AM

## 2012-02-17 NOTE — H&P (Signed)
  History and physical exam unchanged 

## 2012-02-17 NOTE — Anesthesia Procedure Notes (Signed)
Procedure Name: Intubation Date/Time: 02/17/2012 7:35 AM Performed by: Norva Pavlov Pre-anesthesia Checklist: Patient identified, Emergency Drugs available, Suction available and Patient being monitored Patient Re-evaluated:Patient Re-evaluated prior to inductionOxygen Delivery Method: Circle System Utilized Preoxygenation: Pre-oxygenation with 100% oxygen Intubation Type: IV induction Ventilation: Mask ventilation without difficulty Laryngoscope Size: Mac and 3 Grade View: Grade I Tube type: Oral Tube size: 7.0 mm Number of attempts: 1 Airway Equipment and Method: stylet and oral airway Placement Confirmation: ETT inserted through vocal cords under direct vision,  positive ETCO2 and breath sounds checked- equal and bilateral Secured at: 21 cm Tube secured with: Tape Dental Injury: Teeth and Oropharynx as per pre-operative assessment

## 2012-02-17 NOTE — Transfer of Care (Signed)
Immediate Anesthesia Transfer of Care Note  Patient: Sydney [name]  Procedure(s) Performed: Procedure(s) (LRB): LAPAROSCOPIC OVARIAN (Right) CYSTOSCOPY ()  Patient Location: PACU  Anesthesia Type: General  Level of Consciousness:drowsy, arouses to name, follows commands  Airway & Oxygen Therapy: Patient Spontanous Breathing and Patient connected to face mask oxygen  Post-op Assessment: Report given to PACU RN and Post -op Vital signs reviewed and stable  Post vital signs: Reviewed and stable  Complications: No apparent anesthesia complications

## 2012-02-18 NOTE — Op Note (Signed)
Sydney [name], Sydney [name] NO.:  [id number]  MEDICAL RECORD NO.:  [id number]  LOCATION:                               FACILITY:  [facility]  PHYSICIAN:  Juluis Mire, M.D.   DATE OF BIRTH:  [birth year]  DATE OF PROCEDURE:  02/17/2012 DATE OF DISCHARGE:                              OPERATIVE REPORT   PREOPERATIVE DIAGNOSIS:  Cystic enlargement of the right ovary.  POSTOPERATIVE DIAGNOSIS:  Cystic enlargement of the right ovary.  OPERATIVE PROCEDURE:  Open laparoscopy with right salpingo-oophorectomy. Cystoscopy.  SURGEON:  Juluis Mire, M.D.  ANESTHESIA:  General endotracheal.  ESTIMATED BLOOD LOSS:  Minimal.  PACKS AND DRAINS:  None.  INTRAOPERATIVE BLOOD PLACED:  None.  COMPLICATIONS:  None.  INDICATIONS:  Dictated in the history and physical.  PROCEDURE:  The patient was taken to the OR, placed in supine position. After satisfactory level of general endotracheal anesthesia was obtained, the patient was placed in the dorsal lithotomy position using the Allen stirrups.  The abdomen and perineum were prepped out with Betadine.  Bladder was emptied by in-and-out catheterization.  Exam did reveal cystic enlargement of the right adnexa.  The patient was then draped in sterile field.  A subumbilical incision was made with knife and carried through subcutaneous tissue.  Fascia was identified, entered sharply.  Incision of fascia was extended laterally.  The peritoneum was entered with blunt finger pressure.  The open laparoscopic trocar was inserted.  The abdomen was insufflated with carbon dioxide.  The balloon on the trocar was inflated and secured.  The laparoscope was introduced. There was no evidence of injury to adjacent organs.  A 5 mm trocar was put in place in suprapubic area.  At this point in time, the patient was placed in Trendelenburg position.  Visualization of the upper abdomen including liver was clear.  She did have some perihepatic adhesions.   I did not see the appendix, both lateral gutters were clear.  The right ovary was cystically enlarged to approximately 6 cm.  This has basically encompassed the whole ovary.  The left tube and ovary were unremarkable. At this point in time, we elevated the tube and right ovary.  With the ease to see the ureter, the decision was just to remove the ovary at this point in time.  The 5 mm trocar was removed from the suprapubic area.  The incision was enlarged and 10-11 trocar was inserted under direct visualization.  A 5 mm trocar was put in place in the left lower quadrant under direct visualization.  At this point in time, the tube and ovary were elevated.  Using the Enseal, the ovarian vasculature was cauterized and incised.  The mesenteric attachments of the tube and ovary were cauterized and incised up to the round ligament.  The round ligament was then cauterized and incised.  The remaining attachments of the ovary were cauterized and incised freeing the ovary up.  We did have some oozing from the ovarian vasculature.  We brought in the bipolar. The ovarian vascular area was then cauterized again with the bipolar and obtained good hemostasis at this point in time.  Bleeding was still minimal.  At  this point in time, an Endobag was inserted through the suprapubic 10-11.  The ovary and tube were placed in the Endobag.  They were delivered through the suprapubic incisions.  We did half to deflate the ovarian cyst, the fluid was clear and watery.  The whole tube and ovary were removed and sent for pathological review.  The 10-11 trocar was then reinserted.  We irrigated the pelvis.  Visualization again revealed good hemostasis along the right pelvic sidewall.  There was no evidence of injury to adjacent organs.  The abdomen was deflated off carbon dioxide.  All trocars removed.  Subumbilical fascia was closed with 2 figure-of-eight of 0 Vicryl.  Skin was closed with  interrupted subcuticulars of 4-0 Vicryl.  Suprapubic incision was closed with subcuticular sutures of 4-0 Vicryl and Dermabond and the left lower quadrant incision was closed with Dermabond.  At this point in time, the patient had been given indigo carmine. Cystoscope was then inserted.  Visualization revealed no evidence of injury to the bladder.  We could see blue streams of urine coming from both ureteral orifices at this point in time.  The bladder was emptied and the cystoscope was completely removed.  The patient was taken out of dorsal lithotomy position.  Once alert and extubated, transferred to the recovery room in good condition.  Sponge, instrument, and needle count were reported as correct by the circulating nurse x2.     Juluis Mire, M.D.     JSM/MEDQ  D:  02/17/2012  T:  02/18/2012  Job:  [id number]

## 2012-02-21 ENCOUNTER — Encounter (HOSPITAL_BASED_OUTPATIENT_CLINIC_OR_DEPARTMENT_OTHER): Payer: Self-pay | Admitting: Obstetrics and Gynecology

## 2013-07-24 ENCOUNTER — Ambulatory Visit (INDEPENDENT_AMBULATORY_CARE_PROVIDER_SITE_OTHER): Payer: 59 | Admitting: Internal Medicine

## 2013-07-24 ENCOUNTER — Encounter: Payer: Self-pay | Admitting: Internal Medicine

## 2013-07-24 VITALS — BP 118/62 | HR 91 | Temp 98.0°F | Resp 12 | Ht 69.75 in | Wt 204.0 lb

## 2013-07-24 DIAGNOSIS — E213 Hyperparathyroidism, unspecified: Secondary | ICD-10-CM

## 2013-07-24 DIAGNOSIS — E559 Vitamin D deficiency, unspecified: Secondary | ICD-10-CM

## 2013-07-24 MED ORDER — VITAMIN D (ERGOCALCIFEROL) 1.25 MG (50000 UNIT) PO CAPS: 50000.0000 [IU] | ORAL_CAPSULE | ORAL | Status: DC

## 2013-07-24 NOTE — Progress Notes (Signed)
Patient ID: Sydney [name], female   DOB: October 28, 1965, 47 y.o.   MRN: [id number]   HPI  Sydney [name] is a 47 y.o.-year-old female, referred by her PCP, Dr. Arelia Sneddon, for evaluation for hyperparathyroidism.  Pt had GBP in 01/2008 (sleeve gastrectomy) >> lost 168 lbs in 1.5 years! She only fluctuates by ~10 lbs, no major weight gain. She started on MVI before meals (3x a day) after the surgery. As a part of the post gastric bypass annual evaluation, she had a CMP, vitamin D, PTH, other vitamin and mineral levels checked on 06/18/2013. Her PTH returned high, at 138.5,while calcium returned low normal at 8.4 (8.4-10.5). The patient was referred to me for further evaluation for her hyperparathyroidism. Of note, the patient does not have a previous history of hypercalcemia. No fractures, no kidney stones.   Of note, her vitamin D was also low, at 24 on 06/18/2013. The patient was not taking any other vitamin D supplements other than what she got from her multivitamins (which I believe is 600 units daily). She tells me that she has had vitamin D deficiency in the past at the time of her lab draws, and she was usually started on vitamin D 50,000 IU x 1, then 2000 IU weekly, along with her MVI and the vit D levels would normalize, but then decrease again. She drinks lactose-free milk. Eats yoghurt (Activia) at least 1x a day. She also has cheese every day. She loves green leafy vegetables.   Her other labs were normal, including vitamins B, vitamin E, iron studies, thyroid studies (TSH 0.867), CMP normal, cholesterol 145/68/40/91, CBC with differential normal. Copper and zinc were normal. Her albumin was normal at 3.8, however her pre-albumin was slightly low at 13.8 (17-34). Her CRP was low at 0.4. Vitamin A was also slightly low at 29 (30-98).   I reviewed pt's pertinent labs: 06/18/2013: Calcium 8.4, PTH 138.5, phosphorus 3.9 (2.3-4.6) Labs available in Epic: Lab Results  Component Value Date   CALCIUM  9.2 12/12/2007   No h/o CKD. Last BUN/Cr: Lab Results  Component Value Date   BUN 11 12/12/2007   CREATININE 0.8 12/12/2007   Pt does not have a FH of hypercalcemia, pituitary tumors, thyroid cancer, or osteoporosis. Hypothyroidism in mother and daughter.   ROS: Constitutional: no weight gain/loss, no fatigue, no subjective hyperthermia/hypothermia Eyes: no blurry vision, no xerophthalmia ENT: no sore throat, no nodules palpated in throat, no dysphagia/odynophagia, no hoarseness Cardiovascular: no CP/SOB/palpitations/leg swelling Respiratory: no cough/SOB Gastrointestinal: no N/V/D/C Musculoskeletal: no muscle/joint aches Skin: no rashes Neurological: no tremors/numbness/tingling/dizziness Psychiatric: no depression/anxiety  Past Medical History  Diagnosis Date  . Right ovarian cyst   . Abdominal pain, RLQ    Past Surgical History  Procedure Laterality Date  . Vaginal hysterectomy  1999  . Pubovaginal sling  2001  . 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 Tonsillectomy  1975  . Nasal sinus surgery  X3    LAST ONE 1996  . Orif right elbow fx  2004  . Mini gastric bypass  2009  . Cholecystectomy  1990  . Ganglion cyst removed  X3   LAST ONE 1989    LEFT WRIST  . Lipoma removed  2008    RIGHT PALM  . Diagnostic laparoscopy  1998    ENDOMETRIOSIS  . Cystoscopy  02/17/2012    Procedure: CYSTOSCOPY;  Surgeon: Juluis Mire, MD;  Location: [facility];  Service: Gynecology;;   History  Social History  . Marital Status: Married    Spouse Name: N/A    Number of Children: 3   Occupational History  . [employer]ence analyst at [employer]   Social History Main Topics  . Smoking status: Never Smoker   . Smokeless tobacco: Never Used  . Alcohol Use: Yes     Comment: OCCASIONAL  . Drug Use: No  . Sexual Activity: Yes    Partners: Male   Social History Narrative   Regular exercise: yes, walk 20 min daily   Caffeine use: 1 cup of coffee daily   Current Outpatient Prescriptions  on File Prior to Visit  Medication Sig Dispense Refill  . BIOTIN PO Take by mouth.      . Multiple Vitamin (MULTIVITAMIN) tablet Take 1 tablet by mouth 3 (three) times daily.       No current facility-administered medications on file prior to visit.   Allergies  Allergen Reactions  . Codeine Hives   No family history on file.  PE: BP 118/62  Pulse 91  Temp(Src) 98 F (36.7 C) (Oral)  Resp 12  Ht 5' 9.75" (1.772 m)  Wt 204 lb (92.534 kg)  BMI 29.47 kg/m2  SpO2 96% Wt Readings from Last 3 Encounters:  07/24/13 204 lb (92.534 kg)  02/16/12 190 lb (86.183 kg)  02/16/12 190 lb (86.183 kg)   Constitutional: overweight - gynoid obesity, in NAD. No kyphosis. Eyes: PERRLA, EOMI, no exophthalmos ENT: moist mucous membranes, no thyromegaly, no cervical lymphadenopathy Cardiovascular: RRR, No MRG Respiratory: CTA B Gastrointestinal: abdomen soft, NT, ND, BS+ Musculoskeletal: no deformities, strength intact in all 4 Skin: moist, warm, no rashes Neurological: no tremor with outstretched hands, DTR normal in all 4  Assessment: 1. Hyperparathyroidism  2. Vitamin D deficiency  Plan: - I reviewed her recent lab results obtained by Dr. Arelia Sneddon along with the patient.  - I explained that, in the setting of a low normal calcium, low vitamin D, and normal phosphorus, a high PTH level is usually secondary to vitamin D deficiency (causing deficit in absorbing GI calcium). A high PTH in this case is usually physiologic, compensatory, to maintain normal serum calcium. I explained that in the absence of good GI calcium absorption, the PTH will draw calcium from bones or limit excretion from kidneys. - I cannot rule out parathyroid adenoma, however this is much less likely - For now, we decided to try to replete the vitamin D first and then meet again and check vitamin D and parathyroid levels. If PTH still high in the context of a normal vitamin D, would need to check urinary calcium to see if  this is low. If low, I will suggest that she increases her dietary calcium and will retest PTH and urinary calcium after this increase. If the PTH remains elevated in the setting of a normal vitamin D and sufficient calcium intake, we'll start investigation for primary hyperparathyroidism. - She agrees with the plan, and I sent ergocalciferol to her pharmacy, to be taken weekly for the first 8 weeks and then monthly x2 months; she will then return for a visit. - I will see the patient back in 4 months - advised her to join my chart

## 2013-07-24 NOTE — Patient Instructions (Signed)
Please take Ergocalciferol 1 tablet weekly x 8 weeks, then once monthly.  Please return in 4 months for another visit. Continue the Multivitamins. Try to get ~1000-1200 mg daily from your diet.  Dietary sources of calcium and vitamin D:  Calcium content (mg) - http://www.niams.https://www.gonzalez.org/  Fortified oatmeal, 1 packet 350  Sardines, canned in oil, with edible bones, 3 oz. 324  Cheddar cheese, 1 oz. shredded 306  Milk, nonfat, 1 cup 302  Milkshake, 1 cup 300  Yogurt, plain, low-fat, 1 cup 300  Soybeans, cooked, 1 cup 261  Tofu, firm, with calcium,  cup 204  Orange juice, fortified with calcium, 6 oz. 200-260 (varies)  Salmon, canned, with edible bones, 3 oz. 181  Pudding, instant, made with 2% milk,  cup 153  Baked beans, 1 cup 142  Cottage cheese, 1% milk fat, 1 cup 138  Spaghetti, lasagna, 1 cup 125  Frozen yogurt, vanilla, soft-serve,  cup 103  Ready-to-eat cereal, fortified with calcium, 1 cup 100-1,000 (varies)  Cheese pizza, 1 slice 100  Fortified waffles, 2 100  Turnip greens, boiled,  cup 99  Broccoli, raw, 1 cup 90  Ice cream, vanilla,  cup 85  Soy or rice milk, fortified with calcium, 1 cup 80-500 (varies)   Vitamin D content (International Units, IU) - https://www.ars.usda.gov Cod liver oil, 1 tablespoon 1,360  Swordfish, cooked, 3 oz 566  Salmon (sockeye), cooked, 3 oz 447  Tuna fish, canned in water, drained, 3 oz 154  Orange juice fortified with vitamin D, 1 cup (check product labels, as amount of added vitamin D varies) 137  Milk, nonfat, reduced fat, and whole, vitamin D-fortified, 1 cup 115-124  Yogurt, fortified with 20% of the daily value for vitamin D, 6 oz 80  Margarine, fortified, 1 tablespoon 60  Sardines, canned in oil, drained, 2 sardines 46  Liver, beef, cooked, 3 oz 42  Egg, 1 large (vitamin D is found in yolk) 41  Ready-to-eat cereal, fortified with 10% of the daily value for vitamin D, 0.75-1 cup  40  Cheese,  Swiss, 1 oz 6

## 2013-08-08 ENCOUNTER — Other Ambulatory Visit (HOSPITAL_COMMUNITY): Payer: Self-pay | Admitting: Sports Medicine

## 2013-08-08 DIAGNOSIS — S86011A Strain of right Achilles tendon, initial encounter: Secondary | ICD-10-CM

## 2013-08-17 ENCOUNTER — Ambulatory Visit (HOSPITAL_COMMUNITY)
Admission: RE | Admit: 2013-08-17 | Discharge: 2013-08-17 | Disposition: A | Discharge status: Home / Self Care | Payer: 59 | Source: Ambulatory Visit | Attending: Sports Medicine | Admitting: Sports Medicine

## 2013-08-17 DIAGNOSIS — M898X9 Other specified disorders of bone, unspecified site: Secondary | ICD-10-CM | POA: Insufficient documentation

## 2013-08-17 DIAGNOSIS — S86011A Strain of right Achilles tendon, initial encounter: Secondary | ICD-10-CM

## 2013-11-22 ENCOUNTER — Ambulatory Visit: Payer: 59 | Admitting: Internal Medicine

## 2013-11-26 ENCOUNTER — Encounter: Payer: Self-pay | Admitting: Internal Medicine

## 2013-11-26 ENCOUNTER — Ambulatory Visit (INDEPENDENT_AMBULATORY_CARE_PROVIDER_SITE_OTHER): Payer: 59 | Admitting: Internal Medicine

## 2013-11-26 VITALS — BP 112/70 | HR 85 | Temp 98.0°F | Resp 12 | Wt 203.0 lb

## 2013-11-26 DIAGNOSIS — E213 Hyperparathyroidism, unspecified: Secondary | ICD-10-CM

## 2013-11-26 DIAGNOSIS — E559 Vitamin D deficiency, unspecified: Secondary | ICD-10-CM

## 2013-11-26 NOTE — Progress Notes (Signed)
Patient ID: Sydney [name], female   DOB: 05/19/1966, 48 y.o.   MRN: [id number]   HPI  Sydney [name] is a 48 y.o.-year-old female, initially referred by her PCP, Dr. Radene Knee, for evaluation for hyperparathyroidism. Last visit 47 mo ago.  Reviewed hx: Pt had GBP in 01/2008 (sleeve gastrectomy) >> lost 168 lbs in 1.5 years! She only fluctuates by ~10 lbs, no major weight gain. She started on MVI before meals (3x a day) after the surgery. As a part of the post gastric bypass annual evaluation, she had a CMP, vitamin D, PTH, other vitamin and mineral levels checked on 06/18/2013. Her PTH returned high, at 138.5,while calcium returned low normal at 8.4 (8.4-10.5). The patient was referred to me for further evaluation for her hyperparathyroidism. Of note, the patient does not have a previous history of hypercalcemia. No fractures, no kidney stones.   Of note, her vitamin D was also low, at 24 on 06/18/2013. The patient was not taking any other vitamin D supplements other than what she got from her multivitamins (which I believe is 600 units daily). She tells me that she has had vitamin D deficiency in the past at the time of her lab draws, and she was usually started on vitamin D 50,000 IU x 1, then 2000 IU weekly, along with her MVI and the vit D levels would normalize, but then decrease again. She drinks lactose-free milk. Eats yoghurt (Activia) at least 1x a day. She also has cheese every day. She loves green leafy vegetables.   Her other labs were normal, including vitamins B, vitamin E, iron studies, thyroid studies (TSH 0.867), CMP normal, cholesterol 145/68/40/91, CBC with differential normal. Copper and zinc were normal. Her albumin was normal at 3.8, however her pre-albumin was slightly low at 13.8 (17-34). Her CRP was low at 0.4. Vitamin A was also slightly low at 29 (30-98).    Pt does not have a FH of hypercalcemia, pituitary tumors, thyroid cancer, or osteoporosis.  Hypothyroidism in mother and  daughter.   I reviewed pt's pertinent labs: 06/18/2013: Calcium 8.4, PTH 138.5, phosphorus 3.9 (2.3-4.6), vit D 24  Labs available in Epic: Lab Results  Component Value Date   CALCIUM 9.2 12/12/2007   No h/o CKD. Last BUN/Cr: Lab Results  Component Value Date   BUN 11 12/12/2007   CREATININE 0.8 12/12/2007   At last visit, we started Ergocalciferol 50,000 IU weekly x 8 weeks, then continue with monthly.   She is on 3 MVI tab (1 tid) - takes them 30 min before food. She also eat yoghurt and cheese, green leafy vegetables.   ROS: Constitutional: no weight gain/loss, no fatigue, no subjective hyperthermia/hypothermia Eyes: no blurry vision, no xerophthalmia ENT: no sore throat, no nodules palpated in throat, no dysphagia/odynophagia, no hoarseness Cardiovascular: no CP/SOB/palpitations/leg swelling Respiratory: no cough/SOB Gastrointestinal: no N/V/D/C Musculoskeletal: no muscle/joint aches Skin: no rashes Neurological: no tremors/numbness/tingling/dizziness  I reviewed pt's medications, allergies, PMH, social hx, family hx and no changes required, except as mentioned above.  PE: BP 138/82  Pulse 92  Temp(Src) 98.3 F (36.8 C) (Oral)  Resp 12  Ht 5\' 4"  (1.626 m)  Wt 164 lb 12.8 oz (74.753 kg)  BMI 28.27 kg/m2  SpO2 97% Wt Readings from Last 3 Encounters:  11/26/13 203 lb (92.08 kg)  07/24/13 204 lb (92.534 kg)  02/16/12 190 lb (86.183 kg)   Constitutional: overweight - gynoid obesity, in NAD. No kyphosis. Eyes: PERRLA, EOMI, no exophthalmos ENT: moist mucous  membranes, no thyromegaly, no cervical lymphadenopathy Cardiovascular: RRR, No MRG Respiratory: CTA B Gastrointestinal: abdomen soft, NT, ND, BS+ Musculoskeletal: no deformities, strength intact in all 4 Skin: moist, warm, no rashes Neurological: no tremor with outstretched hands, DTR normal in all 4  Assessment: 1. Hyperparathyroidism - likely 2/2 vit D deficiency  2. Vitamin D deficiency  Plan: 1 and 2 -  In the setting of a low normal calcium, low vitamin D, and normal phosphorus, a high PTH level is usually secondary to vitamin D deficiency (causing deficit in absorbing GI calcium). A high PTH in this case is usually physiologic, compensatory, to maintain normal serum calcium. We started vit D high dose at last visit. She feels better on it, with no constipation/abdominal pain. - today we will check PTH, Ca and vit D - If PTH still high in the context of a normal vitamin D, would need to check urinary calcium to see if this is low. If low, I will suggest that she increases her dietary calcium and will retest PTH and urinary calcium after this increase. If the PTH remains elevated in the setting of a normal vitamin D and sufficient calcium intake, we'll start investigation for primary hyperparathyroidism. - If PTH normalizes >> continue high dose vit D once a month or more frequently, ~ on the level - I will see the patient back in 6 months  She needs refill of the vit D.   Office Visit on 11/26/2013  Component Date Value Ref Range Status  . Vit D, 25-Hydroxy 11/26/2013 27* 30 - 89 ng/mL Final   Comment: This assay accurately quantifies Vitamin D, which is the sum of the                          25-Hydroxy forms of Vitamin D2 and D3.  Studies have shown that the                          optimum concentration of 25-Hydroxy Vitamin D is 30 ng/mL or higher.                           Concentrations of Vitamin D between 20 and 29 ng/mL are considered to                          be insufficient and concentrations less than 20 ng/mL are considered                          to be deficient for Vitamin D.  . PTH 11/26/2013 201.7* 14.0 - 72.0 pg/mL Final  . Calcium 11/26/2013 8.3* 8.4 - 10.5 mg/dL Final   Dear Ms Sydney [name], All of the labs are still abnormal: - vit D is still low - PTH still high - calcium still low Let's continue with the Ergocalciferol 50,000 units every 2 weeks, and also add calcium  citrate 500 mg 2x a day, with meals. This is over the counter. I will repeat the labs when you come back. Please let me know if you have any questions. Sincerely, Philemon Kingdom MD

## 2013-11-26 NOTE — Patient Instructions (Signed)
Please stop at the lab. I will send you the results through MyChart >> will let you know if we need a 24h urinary calcium.  Please return in 1 year.

## 2013-11-27 LAB — VITAMIN D 25 HYDROXY (VIT D DEFICIENCY, FRACTURES): VIT D 25 HYDROXY: 27 ng/mL — AB (ref 30–89)

## 2013-11-27 LAB — PTH, INTACT AND CALCIUM
Calcium: 8.3 mg/dL — ABNORMAL LOW (ref 8.4–10.5)
PTH: 201.7 pg/mL — AB (ref 14.0–72.0)

## 2013-11-27 MED ORDER — VITAMIN D (ERGOCALCIFEROL) 1.25 MG (50000 UNIT) PO CAPS: 50000.0000 [IU] | ORAL_CAPSULE | ORAL | Status: DC

## 2014-07-03 ENCOUNTER — Encounter: Payer: Self-pay | Admitting: Internal Medicine

## 2014-07-03 ENCOUNTER — Other Ambulatory Visit: Payer: Self-pay | Admitting: Internal Medicine

## 2014-07-03 DIAGNOSIS — E559 Vitamin D deficiency, unspecified: Secondary | ICD-10-CM

## 2014-07-03 DIAGNOSIS — N2581 Secondary hyperparathyroidism of renal origin: Secondary | ICD-10-CM

## 2014-07-03 DIAGNOSIS — Z9884 Bariatric surgery status: Secondary | ICD-10-CM

## 2014-07-03 MED ORDER — VITAMIN D (ERGOCALCIFEROL) 1.25 MG (50000 UNIT) PO CAPS: 50000.0000 [IU] | ORAL_CAPSULE | ORAL | Status: DC

## 2014-07-03 NOTE — Progress Notes (Signed)
Received labs from Dr. Arvella Nigh, from 06/25/2014: - TSH 0.483 - Calcium 8.1 (8.3-10.5) - Phosphorus 3.6 (2.3-4.6) - Vitamin D 24 (30-89) - Pre-albumin .9 (17-34) - Albumin 3.6 (3.5-5.2) - Magnesium 1.8 - Vitamin B12 294 (211-911) - Vitamin A 15 (38-98) - Ferritin 8 (10-291) - Lipid panel 144/52/43/91 - CMP normal, except calcium The original report will be scanned.  Patient is now taking ergocalciferol 50,000 units every 2 weeks, we will increase this to every week. We'll need to repeat a vitamin D level along with calcium in 2 months.

## 2014-08-20 ENCOUNTER — Encounter: Payer: Self-pay | Admitting: Internal Medicine

## 2014-08-20 NOTE — Progress Notes (Signed)
Received results from Dr. Radene Knee, from 08/13/2014: - Calcium 8.9 (8.4-10.5) - PTH 91 (14-64) Calcium level has normalized, PTH is lagging behind. Continue high dose of vitamin D, ergocalciferol, as prescribed by Dr. Radene Knee.

## 2014-09-11 ENCOUNTER — Encounter: Payer: Self-pay | Admitting: Internal Medicine

## 2015-01-28 ENCOUNTER — Emergency Department (HOSPITAL_COMMUNITY): Payer: 59

## 2015-01-28 ENCOUNTER — Emergency Department (HOSPITAL_COMMUNITY)
Admission: EM | Admit: 2015-01-28 | Discharge: 2015-01-28 | Disposition: A | Discharge status: Home / Self Care | Payer: 59 | Attending: Emergency Medicine | Admitting: Emergency Medicine

## 2015-01-28 ENCOUNTER — Encounter (HOSPITAL_COMMUNITY): Payer: Self-pay

## 2015-01-28 DIAGNOSIS — H5509 Other forms of nystagmus: Secondary | ICD-10-CM | POA: Diagnosis not present

## 2015-01-28 DIAGNOSIS — R110 Nausea: Secondary | ICD-10-CM | POA: Insufficient documentation

## 2015-01-28 DIAGNOSIS — R42 Dizziness and giddiness: Secondary | ICD-10-CM | POA: Diagnosis not present

## 2015-01-28 DIAGNOSIS — Z79899 Other long term (current) drug therapy: Secondary | ICD-10-CM | POA: Insufficient documentation

## 2015-01-28 DIAGNOSIS — Z8742 Personal history of other diseases of the female genital tract: Secondary | ICD-10-CM | POA: Diagnosis not present

## 2015-01-28 LAB — BASIC METABOLIC PANEL
ANION GAP: 8 (ref 5–15)
BUN: 14 mg/dL (ref 6–20)
CHLORIDE: 108 mmol/L (ref 101–111)
CO2: 24 mmol/L (ref 22–32)
Calcium: 8.4 mg/dL — ABNORMAL LOW (ref 8.9–10.3)
Creatinine, Ser: 0.63 mg/dL (ref 0.44–1.00)
GFR calc Af Amer: >60 mL/min (ref >60)
Glucose, Bld: 92 mg/dL (ref 65–99)
POTASSIUM: 4.8 mmol/L (ref 3.5–5.1)
Sodium: 140 mmol/L (ref 135–145)

## 2015-01-28 MED ORDER — ONDANSETRON HCL 4 MG/2ML IJ SOLN
4.0000 mg | Freq: Once | INTRAMUSCULAR | Status: AC
  Administered 2015-01-28: 4 mg via INTRAVENOUS
  Filled 2015-01-28: qty 2

## 2015-01-28 MED ORDER — DIAZEPAM 5 MG PO TABS: 5.0000 mg | ORAL_TABLET | Freq: Four times a day (QID) | ORAL | Status: DC | PRN

## 2015-01-28 MED ORDER — MECLIZINE HCL 25 MG PO TABS: 25.0000 mg | ORAL_TABLET | Freq: Three times a day (TID) | ORAL | Status: DC | PRN

## 2015-01-28 MED ORDER — LORAZEPAM 2 MG/ML IJ SOLN
1.0000 mg | Freq: Once | INTRAMUSCULAR | Status: AC
  Administered 2015-01-28: 1 mg via INTRAVENOUS
  Filled 2015-01-28: qty 1

## 2015-01-28 MED ORDER — MECLIZINE HCL 25 MG PO TABS
25.0000 mg | ORAL_TABLET | Freq: Once | ORAL | Status: AC
  Administered 2015-01-28: 25 mg via ORAL
  Filled 2015-01-28: qty 1

## 2015-01-28 NOTE — Discharge Instructions (Signed)

## 2015-01-28 NOTE — ED Notes (Signed)
Patient transported to MRI 

## 2015-01-28 NOTE — ED Provider Notes (Signed)
CSN: [id number]     Arrival date & time 01/28/15  0825 History   First MD Initiated Contact with Patient 01/28/15 [phone]     Chief Complaint  Patient presents with  . Dizziness  . Nausea     (Consider location/radiation/quality/duration/timing/severity/associated sxs/prior Treatment) Patient is a 49 y.o. female presenting with dizziness. The history is provided by the patient.  Dizziness Associated symptoms: no chest pain, no diarrhea, no headaches, no nausea, no shortness of breath, no vomiting and no weakness    patient presents with dizziness. Began this morning when she woke up. Feels as the room is spinning around. She attempted to go to work but began to feel worse. She's had nausea and slightly blurred vision. She states she similar episode back a few weeks ago that resolved on its own. No numbness weakness. No headache. No confusion. No recent viral illness. No ringing in her ears. No chest. No trouble breathing.  Past Medical History  Diagnosis Date  . Right ovarian cyst   . Abdominal pain, RLQ    Past Surgical History  Procedure Laterality Date  . Vaginal hysterectomy  1999  . Pubovaginal sling  2001  . 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 Tonsillectomy  1975  . Nasal sinus surgery  X3    LAST ONE 1996  . Orif right elbow fx  2004  . Mini gastric bypass  2009  . Cholecystectomy  1990  . Ganglion cyst removed  X3   LAST ONE 1989    LEFT WRIST  . Lipoma removed  2008    RIGHT PALM  . Diagnostic laparoscopy  1998    ENDOMETRIOSIS  . Cystoscopy  02/17/2012    Procedure: CYSTOSCOPY;  Surgeon: Darlyn Chamber, MD;  Location: [facility];  Service: Gynecology;;   Family History  Problem Relation Age of Onset  . Heart failure Mother   . Heart failure Father   . Cancer Other    History  Substance Use Topics  . Smoking status: Never Smoker   . Smokeless tobacco: Never Used  . Alcohol Use: Yes     Comment: OCCASIONAL   OB History    No data available      Review of Systems  Constitutional: Negative for activity change and appetite change.  Eyes: Negative for pain.  Respiratory: Negative for chest tightness and shortness of breath.   Cardiovascular: Negative for chest pain and leg swelling.  Gastrointestinal: Negative for nausea, vomiting, abdominal pain and diarrhea.  Genitourinary: Negative for flank pain.  Musculoskeletal: Negative for back pain and neck stiffness.  Skin: Negative for rash.  Neurological: Positive for dizziness. Negative for weakness, numbness and headaches.  Psychiatric/Behavioral: Negative for behavioral problems.      Allergies  Codeine  Home Medications   Prior to Admission medications   Medication Sig Start Date End Date Taking? Authorizing Provider  BIOTIN PO Take by mouth.   Yes Historical Provider, MD  Multiple Vitamin (MULTIVITAMIN) tablet Take 1 tablet by mouth 3 (three) times daily.   Yes Historical Provider, MD  diazepam (VALIUM) 5 MG tablet Take 1 tablet (5 mg total) by mouth every 6 (six) hours as needed (severe vertigo). 01/28/15   Davonna Belling, MD  meclizine (ANTIVERT) 25 MG tablet Take 1 tablet (25 mg total) by mouth 3 (three) times daily as needed for dizziness. 01/28/15   Davonna Belling, MD  Vitamin D, Ergocalciferol, (DRISDOL) 50000 UNITS CAPS capsule Take 1 capsule (50,000 Units total) by  mouth every 7 (seven) days. Patient not taking: Reported on 01/28/2015 07/03/14   Philemon Kingdom, MD   BP 121/70 mmHg  Pulse 58  Temp(Src) 97.8 F (36.6 C) (Oral)  Resp 17  SpO2 99% Physical Exam  Constitutional: She is oriented to person, place, and time. She appears well-developed and well-nourished.  HENT:  Head: Normocephalic and atraumatic.  Eyes: EOM are normal. Pupils are equal, round, and reactive to light.  Neck: Normal range of motion. Neck supple.  Cardiovascular: Normal rate, regular rhythm and normal heart sounds.   No murmur heard. Pulmonary/Chest: Effort normal and breath  sounds normal. No respiratory distress. She has no wheezes. She has no rales.  Abdominal: Soft. Bowel sounds are normal. She exhibits no distension. There is no tenderness. There is no rebound and no guarding.  Musculoskeletal: Normal range of motion.  Neurological: She is alert and oriented to person, place, and time. No cranial nerve deficit.  Nystagmus to lateral gaze bilaterally. Worsen his symptoms with Marye Round. Good grip strength bilateral. Finger-nose intact bilaterally. Pupils reactive.  Skin: Skin is warm and dry.  Psychiatric: She has a normal mood and affect. Her speech is normal.  Nursing note and vitals reviewed.   ED Course  Procedures (including critical care time) Labs Review Labs Reviewed  BASIC METABOLIC PANEL - Abnormal; Notable for the following:    Calcium 8.4 (*)    All other components within normal limits    Imaging Review Mr Brain Wo Contrast  01/28/2015   CLINICAL DATA:  49 year old female with dizziness, nausea and blurred vision which started this morning. Vertigo. Initial encounter.  EXAM: MRI HEAD WITHOUT CONTRAST  TECHNIQUE: Multiplanar, multiecho pulse sequences of the brain and surrounding structures were obtained without intravenous contrast.  COMPARISON:  No comparison head CT or brain MR  FINDINGS: Sequences are slightly motion degraded.  No acute infarct.  No intracranial hemorrhage.  No hydrocephalus.  No focal area of altered signal intensity to suggest demyelinating process.  Major intracranial vascular structures are patent.  No intracranial mass lesion noted on this unenhanced exam.  Partially empty sella without significant expansion. Minimal prominence peri optic spaces. No flattening of the posterior aspect of the globes as may be seen with pseudotumor cerebri.  Cervical medullary junction and pineal region unremarkable.  Prior sinus surgery with minimal mucosal thickening of majority of paranasal sinuses. 3 mm mucosal thickening inferior right  maxillary sinus. Polypoid opacification anterior inferior medial aspect left maxillary sinus.  IMPRESSION: No acute infarct.  Partially empty sella without significant expansion. Minimal prominence peri optic spaces. No flattening of the posterior aspect of the globes as may be seen with pseudotumor cerebri.  Prior sinus surgery with minimal mucosal thickening of majority of paranasal sinuses. 3 mm mucosal thickening inferior right maxillary sinus. Polypoid opacification anterior inferior medial aspect left maxillary sinus.  Please see above.   Electronically Signed   By: Genia Del M.D.   On: 01/28/2015 12:12     EKG Interpretation None      MDM   Final diagnoses:  Vertigo    Patient with vertigo. Minimal to mild improvement with Antivert. MRI done to look for central cause. No stroke. Did have empty sella. Patient informed of this and will be followed.    Davonna Belling, MD 01/28/15 [phone]

## 2015-01-28 NOTE — ED Notes (Signed)
Patient c/o dizziness this AM and then developed nausea. Patient denies any CP or SOB. Patient states moving her head increases the dizziness and blurred vision.

## 2015-07-08 LAB — HM PAP SMEAR

## 2015-07-22 LAB — HM MAMMOGRAPHY: HM Mammogram: NORMAL (ref 0–4)

## 2015-10-07 ENCOUNTER — Encounter: Payer: Self-pay | Admitting: Internal Medicine

## 2015-10-07 ENCOUNTER — Ambulatory Visit (INDEPENDENT_AMBULATORY_CARE_PROVIDER_SITE_OTHER): Payer: Managed Care, Other (non HMO) | Admitting: Family Medicine

## 2015-10-07 VITALS — BP 122/72 | HR 78 | Temp 98.5°F | Resp 17 | Ht 70.5 in | Wt 201.0 lb

## 2015-10-07 DIAGNOSIS — Z111 Encounter for screening for respiratory tuberculosis: Secondary | ICD-10-CM | POA: Diagnosis not present

## 2015-10-07 DIAGNOSIS — Z119 Encounter for screening for infectious and parasitic diseases, unspecified: Secondary | ICD-10-CM

## 2015-10-07 NOTE — Progress Notes (Signed)
   Subjective:    Patient ID: Sydney [name], female    DOB: 08/14/1966, 50 y.o.   MRN: [id number]  HPI    Review of Systems     Objective:   Physical Exam        Assessment & Plan:

## 2015-10-07 NOTE — Progress Notes (Signed)
[facility] [address] [place] [address] [phone]  Date:  10/07/2015   Name:  Sydney [name]   DOB:  May 12, 1966   MRN:  [id number]  PCP:  Philemon Kingdom, MD    Chief Complaint: Immunizations   History of Present Illness:  Sydney [name] is a 50 y.o. 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 Vitamin D deficiency 07/03/2014  . Hypocalcemia 07/03/2014  . Hyperparathyroidism ([place]) 07/24/2013  . Bariatric surgery status 02/21/2008  . EXOGENOUS OBESITY 12/12/2007    Past Medical History  Diagnosis Date  . Right ovarian cyst   . Abdominal pain, RLQ     Past Surgical History  Procedure Laterality Date  . Vaginal hysterectomy  1999  . Pubovaginal sling  2001  . 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 Tonsillectomy  1975  . Nasal sinus surgery  X3    LAST ONE 1996  . Orif right elbow fx  2004  . Mini gastric bypass  2009  . Cholecystectomy  1990  . Ganglion cyst removed  X3   LAST ONE 1989    LEFT WRIST  . Lipoma removed  2008    RIGHT PALM  . Diagnostic laparoscopy  1998    ENDOMETRIOSIS  . Cystoscopy  02/17/2012    Procedure: CYSTOSCOPY;  Surgeon: Darlyn Chamber, MD;  Location: [facility];  Service: Gynecology;;    Social History  Substance Use Topics  . Smoking status: Never Smoker   . Smokeless tobacco: Never Used  . Alcohol Use: Yes     Comment: OCCASIONAL    Family History  Problem Relation Age of Onset  . Heart failure Mother   . Heart  failure Father   . Cancer Other     Allergies  Allergen Reactions  . Codeine Hives    Medication list has been reviewed and updated.  Current Outpatient Prescriptions on File Prior to Visit  Medication Sig Dispense Refill  . BIOTIN PO Take by mouth.    . Multiple Vitamin (MULTIVITAMIN) tablet Take 1 tablet by mouth 3 (three) times daily.    . Vitamin D, Ergocalciferol, (DRISDOL) 50000 UNITS CAPS capsule Take 1 capsule (50,000 Units total) by mouth every 7 (seven) days. 10 capsule 1  . meclizine (ANTIVERT) 25 MG tablet Take 1 tablet (25 mg total) by mouth 3 (three) times daily as needed for dizziness. (Patient not taking: Reported on 10/07/2015) 30 tablet 0   No current facility-administered medications on file prior to visit.    Review of Systems:  As per HPI- otherwise negative.   Physical Examination: Filed Vitals:   10/07/15 0900  BP: 122/72  Pulse: 78  Temp: 98.5 F (36.9 C)  Resp: 17   Filed Vitals:   10/07/15 0900  Height: 5' 10.5" (1.791 m)  Weight: 201 lb (91.173 kg)   Body mass index is 28.42 kg/(m^2). Ideal Body Weight: Weight in (lb) to have BMI =  25: 176.4  GEN: WDWN, NAD, Non-toxic, A & O x 3 HEENT: Atraumatic, Normocephalic. Neck supple. No masses, No LAD. Ears and Nose: No external deformity. CV: RRR, No M/G/R. No JVD. No thrill. No extra heart sounds. PULM: CTA B, no wheezes, crackles, rhonchi. No retractions. No resp. distress. No accessory muscle use. ABD: S, NT, ND, +BS. No rebound. No HSM. EXTR: No c/c/e NEURO Normal gait.  PSYCH: Normally interactive. Conversant. Not depressed or anxious appearing.  Calm demeanor.    Assessment and Plan: Screening-pulmonary TB - Plan: Quantiferon tb gold assay  Screening examination for infectious disease - Plan: Rubella antibody, IgM, CANCELED: Rubella screen, CANCELED: [place] antibody IgG   quantiferon gold and rubella titers pending - will be in touch with her pending results  Signed Lamar Blinks, MD

## 2015-10-07 NOTE — Progress Notes (Signed)

## 2015-10-09 ENCOUNTER — Encounter: Payer: Self-pay | Admitting: Internal Medicine

## 2015-10-09 ENCOUNTER — Encounter: Payer: Self-pay | Admitting: Family Medicine

## 2015-10-09 ENCOUNTER — Ambulatory Visit (INDEPENDENT_AMBULATORY_CARE_PROVIDER_SITE_OTHER): Payer: Managed Care, Other (non HMO) | Admitting: Internal Medicine

## 2015-10-09 VITALS — BP 114/70 | HR 96 | Temp 97.7°F | Ht 70.5 in | Wt 203.0 lb

## 2015-10-09 DIAGNOSIS — E559 Vitamin D deficiency, unspecified: Secondary | ICD-10-CM

## 2015-10-09 DIAGNOSIS — N2581 Secondary hyperparathyroidism of renal origin: Secondary | ICD-10-CM | POA: Diagnosis not present

## 2015-10-09 LAB — QUANTIFERON TB GOLD ASSAY (BLOOD)
Interferon Gamma Release Assay: NEGATIVE
Mitogen value: >10 IU/mL
QUANTIFERON NIL VALUE: 0.04 [IU]/mL
Quantiferon Tb Ag Minus Nil Value: 0 IU/mL
TB AG VALUE: 0.04 [IU]/mL

## 2015-10-09 LAB — RUBELLA ANTIBODY, IGM: RUBELLA: 0.39 (ref <0.91)

## 2015-10-09 MED ORDER — VITAMIN D (ERGOCALCIFEROL) 1.25 MG (50000 UNIT) PO CAPS: 50000.0000 [IU] | ORAL_CAPSULE | ORAL | Status: DC

## 2015-10-09 NOTE — Patient Instructions (Signed)
Please change my name in PCP's place in Epic.  Please increase Ergocalciferol to 50,000 units 2x a week for the next 2.5 months, then come for another vitamin D check.  Please come back for a follow-up appointment in 1 year. We will stay in touch during this period and will need to check labs more often until then.

## 2015-10-09 NOTE — Progress Notes (Signed)
Patient ID: Sydney [name], female   DOB: [birth year], 50 y.o.   MRN: [id number]   HPI  ALEXIANNA CELLI is a 50 y.o.-year-old female, initially referred by her PCP, Dr. 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 Last visit almost 2 years ago.  Reviewed hx - per last visit note: Pt had GBP in 01/2008 (sleeve gastrectomy) >> lost 168 lbs in 1.5 years! She only fluctuates by ~10 lbs, no major weight gain. She started on MVI before meals (3x a day) after the surgery. As a part of the post gastric bypass annual evaluation, she had a CMP, vitamin D, PTH, other vitamin and mineral levels checked on 06/18/2013. Her PTH returned high, at 138.5,while calcium returned low normal at 8.4 (8.4-10.5). Of note, the patient does not have a previous history of hypercalcemia. No fractures, no kidney stones.   Of note, her vitamin D was also low, at 24 on 06/18/2013. The patient was not taking any other vitamin D supplements other than what she got from her multivitamins (~600 units daily). She had had vitamin D deficiency in the past at the time of her lab draws, and she was usually started on vitamin D 50,000 IU x 1, then 2000 IU weekly, along with her MVI and the vit D levels would normalize, but then decrease again.    Pt does not have a FH of hypercalcemia, pituitary tumors, thyroid cancer, or osteoporosis.   I reviewed pt's pertinent labs: Received results from Dr. Radene Knee, from 08/13/2014: - Calcium 8.9 (8.4-10.5) - PTH 91 (14-64) Calcium level has normalized, PTH is lagging behind. Continue high dose of vitamin D, ergocalciferol, as prescribed by Dr. Radene Knee. Received labs from Dr. Arvella Nigh, from 06/25/2014: - TSH 0.483 - Calcium 8.1 (8.3-10.5) - Phosphorus 3.6 (2.3-4.6) - Vitamin D 24 (30-89) - Pre-albumin .9 (17-34) - Albumin 3.6 (3.5-5.2) - Magnesium 1.8 - Vitamin B12 294 (211-911) - Vitamin A 15 (38-98) - Ferritin 8 (10-291) - Lipid panel  144/52/43/91 - CMP normal, except calcium The original report will be scanned. 06/18/2013: Calcium 8.4, PTH 138.5, phosphorus 3.9 (2.3-4.6), vit D 24  Labs available in Epic: Lab Results  Component Value Date   PTH 201.7* 11/26/2013   CALCIUM 8.4* 01/28/2015   CALCIUM 8.3* 11/26/2013   CALCIUM 9.2 12/12/2007   No h/o CKD. Last BUN/Cr: Lab Results  Component Value Date   BUN 14 01/28/2015   CREATININE 0.63 01/28/2015   Patient was recently seen by Dr. Radene Knee. At that time, she was only on MVI 3 MVI tab (1 tid) >> vitamin D and calcium still returned low and PTH was still high- (will get records) >> she was started on ergocalciferol 50,000 units weekly last week.  She denies perioral numbness, acral cramping. She does have dizziness but was recently diagnosed with vertigo and meclizine helps.  ROS: Constitutional: no weight gain/loss, no fatigue, no subjective hyperthermia/hypothermia Eyes: no blurry vision, no xerophthalmia ENT: no sore throat, no nodules palpated in throat, no dysphagia/odynophagia, no hoarseness Cardiovascular: no CP/SOB/palpitations/leg swelling Respiratory: no cough/SOB Gastrointestinal: no N/V/D/C Musculoskeletal: no muscle/joint aches Skin: no rashes Neurological: no tremors/numbness/tingling/+ occasional dizziness  I reviewed pt's medications, allergies, PMH, social hx, family hx, and changes were documented in the history of present illness. Otherwise, unchanged from my initial visit note. She was diagnosed with vertigo and is on meclizine prn.  PE: BP 114/70 mmHg  Pulse 96  Temp(Src) 97.7 F (36.5 C) (Oral)  Ht 5' 10.5" (  1.791 m)  Wt 203 lb (92.08 kg)  BMI 28.71 kg/m2  SpO2 94%  Wt Readings from Last 3 Encounters:  10/09/15 203 lb (92.08 kg)  10/07/15 201 lb (91.173 kg)  11/26/13 203 lb (92.08 kg)   Constitutional: overweight - gynoid obesity, in NAD. No kyphosis. Eyes: PERRLA, EOMI ENT: moist mucous membranes, no thyromegaly, no cervical  lymphadenopathy Cardiovascular: RRR, No MRG Respiratory: CTA B Gastrointestinal: abdomen soft, NT, ND, BS+ Musculoskeletal: no deformities, strength intact in all 4 Skin: moist, warm, no rashes Neurological: no tremor with outstretched hands, DTR normal in all 4; [place] sign negative B  Assessment: 1. Hyperparathyroidism - likely 2/2 vit D deficiency  2. Vitamin D deficiency  Plan: 1. Hyperparathyroidism P patient with a history of sleeve gastrectomy in 2009, with subsequent malabsorption, whom I saw first in 2014 for hyperparathyroidism in the setting of low normal calcium, low vitamin D, and normal phosphorus. In this case, we discussed again that a high PTH level is usually secondary to vitamin D deficiency (causing deficit in absorbing GI calcium). A high PTH in this case is a physiologic, compensatory, response, to maintain normal serum calcium. We started vit D high dose at last visit, however she was lost for follow-up for the last 2 years and was intermittently on high-dose vitamin D. We asked for records from Dr. Caleb Popp, with whom she had recent labs and the vitamin D returned again low, at 18, with a low calcium at 8.4 (8.6-10.2) and the PTH at [address] [facility] [place] lab). - She was recently started on ergocalciferol 50,000 units once a week, but I advised her to increase to twice a week for the next 2-1/2 months. At that time, we may need to decrease the dose to once a week, however, she is likely not able to come off this higher dose. Alternatively, we can use vitamin D3 5000 units daily after next check. I explained that I would like to see at least 2 normal vitamin D levels before I would recheck her PTH. Also, I would like to check her next PTH with the [facility] assay, which is better than the [facility] assay, because he detects less inactive PTH fragments. - I will see the patient back in one year, but will have labs few times before next appointment  Records Received during the  visit: Received labs from Dr. Arvella Nigh, from 08/29/2015: - TSH 0.751 - PTH 131, associated calcium 8.5 (8.4-10.5) - Calcium 8.4 (8.6-10.2) - Phosphorus 3.7 (2.5-4.5) - Vitamin D 18 (30-100) - Pre-albumin 15 (17-34) - Albumin 3.4 (3.6-5.1) - Magnesium 1.8 - Vitamin B12 372 (211-911) - Vitamin A 15 (38-98) - Ferritin 7 (10-291) - CMP normal, except calcium and albumin The original report will be scanned.  The above labs were discussed with the patient.  - time spent with the patient: 40 minutes, of which >50% was spent in obtaining information about her symptoms, reviewing her previous labs, evaluations, and treatments, counseling her about her condition (please see the discussed topics above), and developing a plan to further investigate it; she had a number of questions which I addressed.

## 2015-10-10 ENCOUNTER — Encounter: Payer: Self-pay | Admitting: Family Medicine

## 2015-10-10 MED FILL — VIT D2 1.25 MG (50,000 UNIT: 1.25 MG | 28 days supply | Qty: 8 | Fill #0

## 2015-10-11 ENCOUNTER — Ambulatory Visit (INDEPENDENT_AMBULATORY_CARE_PROVIDER_SITE_OTHER): Payer: Managed Care, Other (non HMO) | Admitting: *Deleted

## 2015-10-11 DIAGNOSIS — Z23 Encounter for immunization: Secondary | ICD-10-CM

## 2015-10-13 NOTE — Telephone Encounter (Signed)
I called pt back to discuss; I realized that she had a rubella IgM instead of the intended IgG.  When she had her blood drawn we had difficulty finding the proper code for her titer.  I noticed that the lab had entered a code for the rubeola IgG and asked the tech to change it to rubella- however I did not notice that IgM was ultimately selected instead of IgG.  When the titer came back it was negative and the pt was asked to come in for an MMR booster.    Explained this error to her and that she may have received an MMR booster that she did not need.  However no harm will come from the shot and she now has documentation of the 2 MMR shots that she needs.  (Pt reports that her mother thinks she only had 1 MMR as a child, but that she did have the [place] Measles so she is likely immune.) Apologized to her for potentially giving her an unnecessary immunization and asked her to let me know if she has any bill from her insurance company for this shot.  Will forward this to our medical director Dr. Tamala Julian

## 2016-01-01 ENCOUNTER — Other Ambulatory Visit (INDEPENDENT_AMBULATORY_CARE_PROVIDER_SITE_OTHER): Payer: Managed Care, Other (non HMO)

## 2016-01-01 DIAGNOSIS — E559 Vitamin D deficiency, unspecified: Secondary | ICD-10-CM

## 2016-01-01 DIAGNOSIS — N2581 Secondary hyperparathyroidism of renal origin: Secondary | ICD-10-CM

## 2016-01-01 LAB — VITAMIN D 25 HYDROXY (VIT D DEFICIENCY, FRACTURES): VITD: 22.76 ng/mL — AB (ref 30.00–100.00)

## 2016-01-02 ENCOUNTER — Other Ambulatory Visit: Payer: Self-pay | Admitting: *Deleted

## 2016-01-02 ENCOUNTER — Other Ambulatory Visit: Payer: Managed Care, Other (non HMO)

## 2016-01-02 DIAGNOSIS — E559 Vitamin D deficiency, unspecified: Secondary | ICD-10-CM

## 2016-01-02 MED ORDER — VITAMIN D (ERGOCALCIFEROL) 1.25 MG (50000 UNIT) PO CAPS: 50000.0000 [IU] | ORAL_CAPSULE | ORAL | Status: DC

## 2016-02-03 ENCOUNTER — Ambulatory Visit (INDEPENDENT_AMBULATORY_CARE_PROVIDER_SITE_OTHER): Payer: Managed Care, Other (non HMO) | Admitting: Urgent Care

## 2016-02-03 ENCOUNTER — Encounter: Payer: Self-pay | Admitting: Urgent Care

## 2016-02-03 ENCOUNTER — Ambulatory Visit (INDEPENDENT_AMBULATORY_CARE_PROVIDER_SITE_OTHER): Payer: Managed Care, Other (non HMO)

## 2016-02-03 VITALS — BP 122/78 | HR 73 | Temp 97.8°F | Resp 16 | Ht 70.5 in | Wt 202.0 lb

## 2016-02-03 DIAGNOSIS — R0781 Pleurodynia: Secondary | ICD-10-CM

## 2016-02-03 DIAGNOSIS — S20212A Contusion of left front wall of thorax, initial encounter: Secondary | ICD-10-CM

## 2016-02-03 MED ORDER — NAPROXEN SODIUM 550 MG PO TABS: 550.0000 mg | ORAL_TABLET | Freq: Two times a day (BID) | ORAL | Status: DC

## 2016-02-03 MED FILL — NAPROXEN SODIUM 550 MG TAB: 550 | 15 days supply | Qty: 30 | Fill #0

## 2016-02-03 NOTE — Patient Instructions (Addendum)
Rib Contusion A rib contusion is a deep bruise on your rib area. Contusions are the result of a blunt trauma that causes bleeding and injury to the tissues under the skin. A rib contusion may involve bruising of the ribs and of the skin and muscles in the area. The skin overlying the contusion may turn blue, purple, or yellow. Minor injuries will give you a painless contusion, but more severe contusions may stay painful and swollen for a few weeks. CAUSES  A contusion is usually caused by a blow, trauma, or direct force to an area of the body. This often occurs while playing contact sports. SYMPTOMS  Swelling and redness of the injured area.  Discoloration of the injured area.  Tenderness and soreness of the injured area.  Pain with or without movement. DIAGNOSIS  The diagnosis can be made by taking a medical history and performing a physical exam. An X-ray, CT scan, or MRI may be needed to determine if there were any associated injuries, such as broken bones (fractures) or internal injuries. TREATMENT  Often, the best treatment for a rib contusion is rest. Icing or applying cold compresses to the injured area may help reduce swelling and inflammation. Deep breathing exercises may be recommended to reduce the risk of partial lung collapse and pneumonia. Over-the-counter or prescription medicines may also be recommended for pain control. HOME CARE INSTRUCTIONS   Apply ice to the injured area:  Put ice in a plastic bag.  Place a towel between your skin and the bag.  Leave the ice on for 20 minutes, 2-3 times per day.  Take medicines only as directed by your health care provider.  Rest the injured area. Avoid strenuous activity and any activities or movements that cause pain. Be careful during activities and avoid bumping the injured area.  Perform deep-breathing exercises as directed by your health care provider.  Do not lift anything that is heavier than 5 lb (2.3 kg) until your  health care provider approves.  Do not use any tobacco products, including cigarettes, chewing tobacco, or electronic cigarettes. If you need help quitting, ask your health care provider. SEEK MEDICAL CARE IF:   You have increased bruising or swelling.  You have pain that is not controlled with treatment.  You have a fever. SEEK IMMEDIATE MEDICAL CARE IF:   You have difficulty breathing or shortness of breath.  You develop a continual cough, or you cough up thick or bloody sputum.  You feel sick to your stomach (nauseous), you throw up (vomit), or you have abdominal pain.   This information is not intended to replace advice given to you by your health care provider. Make sure you discuss any questions you have with your health care provider.   Document Released: 05/18/2001 Document Revised: 09/13/2014 Document Reviewed: 06/04/2014 Elsevier Interactive Patient Education 2016 [employer].      IF you received an x-ray today, you will receive an invoice from [facility]. Please contact [facility] at [phone] with questions or concerns regarding your invoice.   IF you received labwork today, you will receive an invoice from [employer]. Please contact [facility] at [phone] with questions or concerns regarding your invoice.   Our billing staff will not be able to assist you with questions regarding bills from these companies.  You will be contacted with the lab results as soon as they are available. The fastest way to get your results is to activate your My Chart account. Instructions are located  on the last page of this paperwork. If you have not heard from [place] regarding the results in 2 weeks, please contact this office.

## 2016-02-03 NOTE — Progress Notes (Signed)
    MRN: [id number] DOB: 03/01/1966  Subjective:   Sydney [name] is a 50 y.o. female presenting for chief complaint of Rib Injury  Reports 4 day history of left rib injury. Patient was digging a hole and applied a significant amount of pressure to the post she was placing using her weight. She heard a pop on her left side and has since had worsening left rib pain. Now has difficulty taking deep breath, pain with coughing or sneezing, difficulty lifting left arm. Has tried APAP once last night with minimal relief.   Sydney [name] has a current medication list which includes the following prescription(s): biotin, meclizine, multivitamin, and vitamin d (ergocalciferol). Also is allergic to codeine.  Sydney [name]  has a past medical history of Right ovarian cyst and Abdominal pain, RLQ. Also  has past surgical history that includes Vaginal hysterectomy (1999); Pubovaginal sling (2001); Splenectomy (1977); Tonsillectomy (1975); Nasal sinus surgery (X3    LAST ONE 1996); ORIF RIGHT ELBOW FX (2004); MINI GASTRIC BYPASS (2009); Cholecystectomy (1990); GANGLION CYST REMOVED (X3   LAST ONE 1989); LIPOMA REMOVED (2008); Diagnostic laparoscopy (1998); and Cystoscopy (02/17/2012).  Objective:   Vitals: BP 122/78 mmHg  Pulse 73  Temp(Src) 97.8 F (36.6 C) (Oral)  Resp 16  Ht 5' 10.5" (1.791 m)  Wt 202 lb (91.627 kg)  BMI 28.56 kg/m2  SpO2 99%  Physical Exam  Constitutional: She is oriented to person, place, and time. She appears well-developed and well-nourished.  Cardiovascular: Normal rate, regular rhythm and intact distal pulses.  Exam reveals no gallop and no friction rub.   No murmur heard. Pulmonary/Chest: Effort normal. No respiratory distress. She has no wheezes. She has no rales.   She exhibits tenderness (over areas depicted). She exhibits no mass, no laceration, no crepitus, no edema, no deformity, no swelling and no retraction.    Neurological: She is alert and oriented to person, place, and  time.  Skin: Skin is warm and dry.   Dg Ribs Unilateral W/chest Left  02/03/2016  CLINICAL DATA:  Left ribcage injury EXAM: LEFT RIBS AND CHEST - 3+ VIEW COMPARISON:  Chest x-ray of September 15, 2004 FINDINGS: The lungs are adequately inflated and clear. There is no pneumothorax, pneumomediastinum, or pleural effusion. The heart and pulmonary vascularity are normal. The mediastinum is normal in width. The observed bony thorax exhibits no acute abnormality. Specific attention to the left ribs reveals no acute rib fracture. IMPRESSION: There is no active cardiopulmonary disease. No acute or healing rib fracture is observed. Electronically Signed   By: David  Martinique M.D.   On: 02/03/2016 12:04   Assessment and Plan :   1. Rib contusion, left, initial encounter 2. Rib pain on left side - Will manage conservatively with Anaprox, ACE wraps. Rtc in 2 weeks if no improvement.  Jaynee Eagles, PA-C [facility] [phone] 02/03/2016 11:36 AM

## 2016-03-07 ENCOUNTER — Encounter (HOSPITAL_COMMUNITY): Payer: Self-pay

## 2016-03-07 ENCOUNTER — Observation Stay (HOSPITAL_COMMUNITY)
Admission: EM | Admit: 2016-03-07 | Discharge: 2016-03-09 | Disposition: A | Discharge status: Home / Self Care | Payer: Managed Care, Other (non HMO) | Attending: Internal Medicine | Admitting: Internal Medicine

## 2016-03-07 ENCOUNTER — Emergency Department (HOSPITAL_COMMUNITY): Payer: Managed Care, Other (non HMO)

## 2016-03-07 DIAGNOSIS — R7989 Other specified abnormal findings of blood chemistry: Secondary | ICD-10-CM | POA: Diagnosis not present

## 2016-03-07 DIAGNOSIS — E213 Hyperparathyroidism, unspecified: Secondary | ICD-10-CM | POA: Insufficient documentation

## 2016-03-07 DIAGNOSIS — R197 Diarrhea, unspecified: Principal | ICD-10-CM | POA: Insufficient documentation

## 2016-03-07 DIAGNOSIS — R110 Nausea: Secondary | ICD-10-CM | POA: Diagnosis present

## 2016-03-07 DIAGNOSIS — R945 Abnormal results of liver function studies: Secondary | ICD-10-CM

## 2016-03-07 DIAGNOSIS — Z9049 Acquired absence of other specified parts of digestive tract: Secondary | ICD-10-CM | POA: Diagnosis not present

## 2016-03-07 DIAGNOSIS — R740 Nonspecific elevation of levels of transaminase and lactic acid dehydrogenase [LDH]: Secondary | ICD-10-CM | POA: Insufficient documentation

## 2016-03-07 DIAGNOSIS — K862 Cyst of pancreas: Secondary | ICD-10-CM | POA: Diagnosis present

## 2016-03-07 DIAGNOSIS — Z9884 Bariatric surgery status: Secondary | ICD-10-CM | POA: Insufficient documentation

## 2016-03-07 DIAGNOSIS — E559 Vitamin D deficiency, unspecified: Secondary | ICD-10-CM | POA: Insufficient documentation

## 2016-03-07 DIAGNOSIS — E870 Hyperosmolality and hypernatremia: Secondary | ICD-10-CM | POA: Diagnosis not present

## 2016-03-07 DIAGNOSIS — F40240 Claustrophobia: Secondary | ICD-10-CM | POA: Diagnosis not present

## 2016-03-07 DIAGNOSIS — D3502 Benign neoplasm of left adrenal gland: Secondary | ICD-10-CM | POA: Diagnosis not present

## 2016-03-07 DIAGNOSIS — R1030 Lower abdominal pain, unspecified: Secondary | ICD-10-CM | POA: Diagnosis present

## 2016-03-07 DIAGNOSIS — D72829 Elevated white blood cell count, unspecified: Secondary | ICD-10-CM | POA: Diagnosis not present

## 2016-03-07 DIAGNOSIS — Z23 Encounter for immunization: Secondary | ICD-10-CM | POA: Insufficient documentation

## 2016-03-07 DIAGNOSIS — Z66 Do not resuscitate: Secondary | ICD-10-CM | POA: Diagnosis not present

## 2016-03-07 DIAGNOSIS — R7401 Elevation of levels of liver transaminase levels: Secondary | ICD-10-CM | POA: Diagnosis present

## 2016-03-07 LAB — APTT: APTT: 31 s (ref 24–37)

## 2016-03-07 LAB — URINALYSIS, ROUTINE W REFLEX MICROSCOPIC
BILIRUBIN URINE: NEGATIVE
GLUCOSE, UA: NEGATIVE mg/dL
HGB URINE DIPSTICK: NEGATIVE
KETONES UR: 15 mg/dL — AB
Leukocytes, UA: NEGATIVE
Nitrite: NEGATIVE
PH: 6 (ref 5.0–8.0)
Protein, ur: NEGATIVE mg/dL
SPECIFIC GRAVITY, URINE: 1.025 (ref 1.005–1.030)

## 2016-03-07 LAB — PROTIME-INR
INR: 1.2 (ref 0.00–1.49)
Prothrombin Time: 15.4 seconds — ABNORMAL HIGH (ref 11.6–15.2)

## 2016-03-07 LAB — GAMMA GT: GGT: 54 U/L — ABNORMAL HIGH (ref 7–50)

## 2016-03-07 LAB — ACETAMINOPHEN LEVEL: Acetaminophen (Tylenol), Serum: <10 ug/mL — ABNORMAL LOW (ref 10–30)

## 2016-03-07 LAB — COMPREHENSIVE METABOLIC PANEL
ALK PHOS: 190 U/L — AB (ref 38–126)
ALT: 474 U/L — AB (ref 14–54)
AST: 951 U/L — AB (ref 15–41)
Albumin: 3.2 g/dL — ABNORMAL LOW (ref 3.5–5.0)
Anion gap: 2 — ABNORMAL LOW (ref 5–15)
BUN: 11 mg/dL (ref 6–20)
CALCIUM: 8.2 mg/dL — AB (ref 8.9–10.3)
CO2: 24 mmol/L (ref 22–32)
CREATININE: 0.63 mg/dL (ref 0.44–1.00)
Chloride: 112 mmol/L — ABNORMAL HIGH (ref 101–111)
Glucose, Bld: 96 mg/dL (ref 65–99)
Potassium: 3.9 mmol/L (ref 3.5–5.1)
Sodium: 138 mmol/L (ref 135–145)
Total Bilirubin: 0.8 mg/dL (ref 0.3–1.2)
Total Protein: 6.8 g/dL (ref 6.5–8.1)

## 2016-03-07 LAB — CBC
HEMATOCRIT: 38.1 % (ref 36.0–46.0)
HEMOGLOBIN: 11.9 g/dL — AB (ref 12.0–15.0)
MCH: 27.3 pg (ref 26.0–34.0)
MCHC: 31.2 g/dL (ref 30.0–36.0)
MCV: 87.4 fL (ref 78.0–100.0)
Platelets: 495 10*3/uL — ABNORMAL HIGH (ref 150–400)
RBC: 4.36 MIL/uL (ref 3.87–5.11)
RDW: 15.4 % (ref 11.5–15.5)
WBC: 11.7 10*3/uL — ABNORMAL HIGH (ref 4.0–10.5)

## 2016-03-07 LAB — C DIFFICILE QUICK SCREEN W PCR REFLEX
C DIFFICILE (CDIFF) TOXIN: NEGATIVE
C DIFFICLE (CDIFF) ANTIGEN: NEGATIVE
C Diff interpretation: NEGATIVE

## 2016-03-07 LAB — LIPASE, BLOOD: Lipase: 28 U/L (ref 11–51)

## 2016-03-07 MED ORDER — ONDANSETRON HCL 4 MG/2ML IJ SOLN
4.0000 mg | Freq: Once | INTRAMUSCULAR | Status: AC
  Administered 2016-03-07: 4 mg via INTRAVENOUS
  Filled 2016-03-07: qty 2

## 2016-03-07 MED ORDER — SODIUM CHLORIDE 0.9 % IV SOLN
INTRAVENOUS | Status: DC
  Administered 2016-03-07: 1000 mL via INTRAVENOUS
  Administered 2016-03-09: 01:00:00 via INTRAVENOUS

## 2016-03-07 MED ORDER — ENOXAPARIN SODIUM 40 MG/0.4ML ~~LOC~~ SOLN
40.0000 mg | SUBCUTANEOUS | Status: DC
  Administered 2016-03-07 – 2016-03-08 (×2): 40 mg via SUBCUTANEOUS
  Filled 2016-03-07 (×2): qty 0.4

## 2016-03-07 MED ORDER — SODIUM CHLORIDE 0.9 % IV BOLUS (SEPSIS)
1000.0000 mL | Freq: Once | INTRAVENOUS | Status: AC
  Administered 2016-03-07: 1000 mL via INTRAVENOUS

## 2016-03-07 MED ORDER — ONDANSETRON HCL 4 MG PO TABS: 4.0000 mg | ORAL_TABLET | Freq: Four times a day (QID) | ORAL | Status: DC | PRN

## 2016-03-07 MED ORDER — ACETAMINOPHEN 650 MG RE SUPP: 650.0000 mg | Freq: Four times a day (QID) | RECTAL | Status: DC | PRN

## 2016-03-07 MED ORDER — HYDROCODONE-ACETAMINOPHEN 5-325 MG PO TABS: 1.0000 | ORAL_TABLET | ORAL | Status: DC | PRN

## 2016-03-07 MED ORDER — DICYCLOMINE HCL 10 MG/ML IM SOLN: 20.0000 mg | Freq: Once | INTRAMUSCULAR | Status: DC

## 2016-03-07 MED ORDER — ACETAMINOPHEN 325 MG PO TABS
650.0000 mg | ORAL_TABLET | Freq: Four times a day (QID) | ORAL | Status: DC | PRN
Start: 2016-03-07 — End: 2016-03-07

## 2016-03-07 MED ORDER — ONDANSETRON HCL 4 MG/2ML IJ SOLN: 4.0000 mg | Freq: Four times a day (QID) | INTRAMUSCULAR | Status: DC | PRN

## 2016-03-07 NOTE — H&P (Signed)
History and Physical    Sydney [name] [id number] DOB: [birth year] DOA: 03/07/2016   PCP: Philemon Kingdom, MD   Patient coming from:  Home    Chief Complaint:  Diarrhea and Bloating   HPI: Sydney [name] is a 50 y.o. female with a history of vitamin D deficiency, hyperparathyroidism, status postgastric bypass in 2009,status post cholecystectomy in 1990, presenting to the emergency department with abdominal discomfort with nausea without vomiting, and with watery diarrhea. These symptoms have been present since last Thursday, accompanied by abdominal bloating and intermittent cramps, somewhat relieved by Gas-X. These symptoms were first experienced while in [place].she denies any blood in the stools.The patient has tried her husband's Vicodin, which seemed to help with the pain. She denies any fever, chills back pain urinary symptoms or flank pain. No recent antibiotic use. The patient denies any sick contacts, or fluid poisoning while in [place]. She has also tried I [place] AD, which helped initially, but then when the symptoms returned, she felt the need to have further evaluation.  No chest pain or shortness of breath,dizziness or vertigo. Denies any rashes Denies any myalgias. No history og GI cancer.     ED Course:  BP 109/80 mmHg  Pulse 71  Temp(Src) 98.6 F (37 C) (Oral)  Resp 16  SpO2 99%  AST 951, ALT474, alkaline phosphatase 190,white count 11.7. Hemoglobin 11.9.Urine is Amber, and cloudy, without bacteria or nitrites. Abdominal ultrasound shows 11 mm dilated common bile duct in a patient with status post cholecystectomy. Per film report, no findings to explain the patient's history of abdominal pain and bloating with diarrhea. Received 1 L boluses of IV fluids, Zofran, Bentylx1, with improvement of her symptoms. GI is planning to see the patient in consultation.  Review of Systems: As per HPI otherwise 10 point review of systems negative.    Past Medical History   Diagnosis Date  . Right ovarian cyst   . Abdominal pain, RLQ     Past Surgical History  Procedure Laterality Date  . Vaginal hysterectomy  1999  . Pubovaginal sling  2001  . 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 Tonsillectomy  1975  . Nasal sinus surgery  X3    LAST ONE 1996  . Orif right elbow fx  2004  . Mini gastric bypass  2009  . Cholecystectomy  1990  . Ganglion cyst removed  X3   LAST ONE 1989    LEFT WRIST  . Lipoma removed  2008    RIGHT PALM  . Diagnostic laparoscopy  1998    ENDOMETRIOSIS  . Cystoscopy  02/17/2012    Procedure: CYSTOSCOPY;  Surgeon: Darlyn Chamber, MD;  Location: [facility];  Service: Gynecology;;    Social History Social History   Social History  . Marital Status: Married    Spouse Name: N/A  . Number of Children: N/A  . Years of Education: N/A   Occupational History  . Not on file.   Social History Main Topics  . Smoking status: Never Smoker   . Smokeless tobacco: Never Used  . Alcohol Use: Yes     Comment: OCCASIONAL  . Drug Use: No  . Sexual Activity:    Partners: Male   Other Topics Concern  . Not on file   Social History Narrative   Regular exercise: yes, walk 20 min daily   Caffeine use: 1 cup of coffee daily     Allergies  Allergen Reactions  .  Codeine Hives    Family History  Problem Relation Age of Onset  . Heart failure Mother   . Heart failure Father   . Cancer Other       Prior to Admission medications   Medication Sig Start Date End Date Taking? Authorizing Provider  IRON PO Take 1 tablet by mouth 2 (two) times daily.   Yes Historical Provider, MD  Multiple Vitamin (MULTIVITAMIN) tablet Take 1 tablet by mouth 3 (three) times daily.   Yes Historical Provider, MD  Vitamin D, Ergocalciferol, (DRISDOL) 50000 units CAPS capsule Take 1 capsule (50,000 Units total) by mouth 3 (three) times a week. 01/02/16  Yes Philemon Kingdom, MD  meclizine (ANTIVERT) 25 MG tablet Take 1 tablet (25 mg total) by  mouth 3 (three) times daily as needed for dizziness. Patient not taking: Reported on 03/07/2016 01/28/15   Davonna Belling, MD  naproxen sodium (ANAPROX DS) 550 MG tablet Take 1 tablet (550 mg total) by mouth 2 (two) times daily with a meal. Patient not taking: Reported on 03/07/2016 02/03/16   Jaynee Eagles, PA-C    Physical Exam:    Filed Vitals:   03/07/16 1020 03/07/16 1037 03/07/16 1200 03/07/16 1500  BP: 110/70 107/81 107/71 109/80  Pulse: 80 76 68 71  Temp: 98.6 F (37 C)     TempSrc: Oral     Resp: 18 15  16   SpO2: 97% 98% 97% 99%       Constitutional: NAD, calm  Filed Vitals:   03/07/16 1020 03/07/16 1037 03/07/16 1200 03/07/16 1500  BP: 110/70 107/81 107/71 109/80  Pulse: 80 76 68 71  Temp: 98.6 F (37 C)     TempSrc: Oral     Resp: 18 15  16   SpO2: 97% 98% 97% 99%   Eyes: PERRL, lids and conjunctivae normal ENMT: Mucous membranes are moist. Posterior pharynx clear of any exudate or lesions.Normal dentition.  Neck: normal, supple, no masses, no thyromegaly Respiratory: clear to auscultation bilaterally, no wheezing, no crackles. Normal respiratory effort. No accessory muscle use.  Cardiovascular: Regular rate and rhythm, no murmurs / rubs / gallops. No extremity edema. 2+ pedal pulses. No carotid bruits.  Abdomen: mild suprapubic tenderness, no masses palpated. No hepatosplenomegaly. Bowel sounds active Musculoskeletal: no clubbing / cyanosis. No joint deformity upper and lower extremities. Good ROM, no contractures. Normal muscle tone.  Skin: no rashes, lesions, ulcers.  Neurologic: CN 2-12 grossly intact. Sensation intact, DTR normal. Strength 5/5 in all 4.  Psychiatric: Normal judgment and insight. Alert and oriented x 3. Normal mood.     Labs on Admission: I have personally reviewed following labs and imaging studies  CBC:  Recent Labs Lab 03/07/16 1026  WBC 11.7*  HGB 11.9*  HCT 38.1  MCV 87.4  PLT 495*    Basic Metabolic Panel:  Recent Labs Lab  03/07/16 1026  NA 138  K 3.9  CL 112*  CO2 24  GLUCOSE 96  BUN 11  CREATININE 0.63  CALCIUM 8.2*    GFR: CrCl cannot be calculated (Unknown ideal weight.).  Liver Function Tests:  Recent Labs Lab 03/07/16 1026  AST 951*  ALT 474*  ALKPHOS 190*  BILITOT 0.8  PROT 6.8  ALBUMIN 3.2*    Recent Labs Lab 03/07/16 1026  LIPASE 28   Urine analysis:    Component Value Date/Time   COLORURINE AMBER* 03/07/2016 1026   APPEARANCEUR CLOUDY* 03/07/2016 1026   LABSPEC 1.025 03/07/2016 1026   PHURINE 6.0 03/07/2016 1026  GLUCOSEU NEGATIVE 03/07/2016 1026   HGBUR NEGATIVE 03/07/2016 1026   BILIRUBINUR NEGATIVE 03/07/2016 1026   KETONESUR 15* 03/07/2016 1026   PROTEINUR NEGATIVE 03/07/2016 1026   NITRITE NEGATIVE 03/07/2016 1026   LEUKOCYTESUR NEGATIVE 03/07/2016 1026    Sepsis Labs: Invalid input(s): PROCALCITONIN, LACTICIDVEN )No results found for this or any previous visit (from the past 240 hour(s)).   Radiological Exams on Admission: US Abdomen Complete  03/07/2016  CLINICAL DATA:  Abdominal pain with bloating and diarrhea. EXAM: ABDOMEN ULTRASOUND COMPLETE COMPARISON:  None. FINDINGS: Gallbladder: Surgically absent. Common bile duct: Diameter: 11 mm. Liver: No focal lesion identified. Within normal limits in parenchymal echogenicity. IVC: No abnormality visualized. Pancreas: Not well seen secondary to overlying bowel gas. Spleen: Surgically absent. Right Kidney: Length: 11.5 cm. Echogenicity within normal limits. No mass or hydronephrosis visualized. Left Kidney: Length: 12.1 cm. Echogenicity within normal limits. No mass or hydronephrosis visualized. Abdominal aorta: No aneurysm visualized. Other findings: None. IMPRESSION: 1. Dilated common bile duct in this patient status post cholecystectomy. Correlation with liver function tests recommended. 2. No findings to explain the patient's history of abdominal pain with bloating and diarrhea. Electronically Signed   By: Misty Stanley M.D.   On: 03/07/2016 12:56    EKG: Independently reviewed.  Assessment/Plan Active Problems:   Diarrhea   Nausea without vomiting   Diarrhea with abdominal cramping and nausea : Patient history includes cholecystectomy AST 951, ALT474, alkaline phosphatase 190,white count 11.7. Abdominal ultrasound shows 11 mm dilated common bile duct in a patient with status post cholecystectomy. Per film report, no findings to explain the patient's history of abdominal pain and bloating with diarrhea. Doubt infection . Received 1 l bolus NS and Zofran/Bentyl with some relief   Lactoferrin and probiotics added to orders.   Enteric precautions  NPO until GI consult, then Clear liquid diet and advance as tolerated  Will start IV NS bolus 1 liter. May need gentle hydration pending on symptoms GI to see, rule out any other etiology, poss w/u may include MRCP.     Deconditioning Consult PT/OT/Nutrition  DVT prophylaxis: Lovenox Code Status:   Full    Partial  DNR Family Communication:  Discussed with patient wife husband  Disposition Plan: Expect patient to be discharged to home after condition improves Consults called:    GI, Dr. Michail Sermon  Admission status: Medsurg  obs   Rondel Jumbo, PA-C [facility]   If 7PM-7AM, please contact night-coverage [email or web address] Password [facility]  03/07/2016, 3:56 PM

## 2016-03-07 NOTE — ED Notes (Signed)
Patient transported to Ultrasound 

## 2016-03-07 NOTE — ED Notes (Signed)
Patient complains of diarrhea and abdominal pain and bloating since Thursday. No vomiting with same. Last pm states that the pain was worse and took a vicodin with some relief.

## 2016-03-07 NOTE — ED Provider Notes (Signed)
CSN: [id number]     Arrival date & time 03/07/16  1016 History   First MD Initiated Contact with Patient 03/07/16 1044     Chief Complaint  Patient presents with  . Abdominal Pain     (Consider location/radiation/quality/duration/timing/severity/associated sxs/prior Treatment) HPI GWILI FELMLEE is a 50 y.o. female history of hysterectomy, splenectomy, cholecystectomy, reported left oophorectomy, here for evaluation of abdominal discomfort with nausea, diarrhea. She reports since Thursday she has had intermittent abdominal bloating and crampiness, somewhat relieved by Gas-X. She reports nausea, but no vomiting. She reports multiple episodes of watery diarrhea, no bloody or dark stool. She has tried her husband's Vicodin which seems to help. No fevers, chills, back pain, urinary symptoms or flank pain, recent antibiotic use.  Past Medical History  Diagnosis Date  . Right ovarian cyst   . Abdominal pain, RLQ    Past Surgical History  Procedure Laterality Date  . Vaginal hysterectomy  1999  . Pubovaginal sling  2001  . 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 Tonsillectomy  1975  . Nasal sinus surgery  X3    LAST ONE 1996  . Orif right elbow fx  2004  . Mini gastric bypass  2009  . Cholecystectomy  1990  . Ganglion cyst removed  X3   LAST ONE 1989    LEFT WRIST  . Lipoma removed  2008    RIGHT PALM  . Diagnostic laparoscopy  1998    ENDOMETRIOSIS  . Cystoscopy  02/17/2012    Procedure: CYSTOSCOPY;  Surgeon: Darlyn Chamber, MD;  Location: [facility];  Service: Gynecology;;   Family History  Problem Relation Age of Onset  . Heart failure Mother   . Heart failure Father   . Cancer Other    Social History  Substance Use Topics  . Smoking status: Never Smoker   . Smokeless tobacco: Never Used  . Alcohol Use: Yes     Comment: OCCASIONAL   OB History    No data available     Review of Systems A 10 point review of systems was completed and was negative except for  pertinent positives and negatives as mentioned in the history of present illness     Allergies  Codeine  Home Medications   Prior to Admission medications   Medication Sig Start Date End Date Taking? Authorizing Provider  IRON PO Take 1 tablet by mouth 2 (two) times daily.   Yes Historical Provider, MD  Multiple Vitamin (MULTIVITAMIN) tablet Take 1 tablet by mouth 3 (three) times daily.   Yes Historical Provider, MD  Vitamin D, Ergocalciferol, (DRISDOL) 50000 units CAPS capsule Take 1 capsule (50,000 Units total) by mouth 3 (three) times a week. 01/02/16  Yes Philemon Kingdom, MD  meclizine (ANTIVERT) 25 MG tablet Take 1 tablet (25 mg total) by mouth 3 (three) times daily as needed for dizziness. Patient not taking: Reported on 03/07/2016 01/28/15   Davonna Belling, MD  naproxen sodium (ANAPROX DS) 550 MG tablet Take 1 tablet (550 mg total) by mouth 2 (two) times daily with a meal. Patient not taking: Reported on 03/07/2016 02/03/16   Jaynee Eagles, PA-C   BP 109/80 mmHg  Pulse 71  Temp(Src) 98.6 F (37 C) (Oral)  Resp 16  SpO2 99% Physical Exam  Constitutional: She is oriented to person, place, and time. She appears well-developed and well-nourished.  HENT:  Head: Normocephalic and atraumatic.  Mouth/Throat: Oropharynx is clear and moist.  Eyes: Conjunctivae are normal.  Pupils are equal, round, and reactive to light. Right eye exhibits no discharge. Left eye exhibits no discharge. No scleral icterus.  Neck: Neck supple.  Cardiovascular: Normal rate, regular rhythm and normal heart sounds.   Pulmonary/Chest: Effort normal and breath sounds normal. No respiratory distress. She has no wheezes. She has no rales.  Abdominal: Soft.  Mild suprapubic tenderness. Abdomen is otherwise soft, nondistended without rebound or guarding. Old Postsurgical scars, no other lesions or abnormalities.  Musculoskeletal: She exhibits no tenderness.  Neurological: She is alert and oriented to person, place, and  time.  Cranial Nerves II-XII grossly intact  Skin: Skin is warm and dry. No rash noted.  Psychiatric: She has a normal mood and affect.  Nursing note and vitals reviewed.   ED Course  Procedures (including critical care time) Labs Review Labs Reviewed  COMPREHENSIVE METABOLIC PANEL - Abnormal; Notable for the following:    Chloride 112 (*)    Calcium 8.2 (*)    Albumin 3.2 (*)    AST 951 (*)    ALT 474 (*)    Alkaline Phosphatase 190 (*)    Anion gap 2 (*)    All other components within normal limits  CBC - Abnormal; Notable for the following:    WBC 11.7 (*)    Hemoglobin 11.9 (*)    Platelets 495 (*)    All other components within normal limits  URINALYSIS, ROUTINE W REFLEX MICROSCOPIC (NOT AT [facility]) - Abnormal; Notable for the following:    Color, Urine AMBER (*)    APPearance CLOUDY (*)    Ketones, ur 15 (*)    All other components within normal limits  LIPASE, BLOOD    Imaging Review US Abdomen Complete  03/07/2016  CLINICAL DATA:  Abdominal pain with bloating and diarrhea. EXAM: ABDOMEN ULTRASOUND COMPLETE COMPARISON:  None. FINDINGS: Gallbladder: Surgically absent. Common bile duct: Diameter: 11 mm. Liver: No focal lesion identified. Within normal limits in parenchymal echogenicity. IVC: No abnormality visualized. Pancreas: Not well seen secondary to overlying bowel gas. Spleen: Surgically absent. Right Kidney: Length: 11.5 cm. Echogenicity within normal limits. No mass or hydronephrosis visualized. Left Kidney: Length: 12.1 cm. Echogenicity within normal limits. No mass or hydronephrosis visualized. Abdominal aorta: No aneurysm visualized. Other findings: None. IMPRESSION: 1. Dilated common bile duct in this patient status post cholecystectomy. Correlation with liver function tests recommended. 2. No findings to explain the patient's history of abdominal pain with bloating and diarrhea. Electronically Signed   By: Misty Stanley M.D.   On: 03/07/2016 12:56   I have  personally reviewed and evaluated these images and lab results as part of my medical decision-making.   EKG Interpretation None      MDM   Patient presents with nausea and abdominal cramping/discomfort since Thursday. She has a nonsurgical abdomen. She is hemodynamically stable and afebrile. Low suspicion for appendicitis or other emergent abdominal infection at this time. Pending screening labs. We'll order Zofran, fluid bolus and reassess.  Labs are significant for AST 951, ALT 474, alkaline phosphatase 190. She does have a mild leukocytosis 11.7. Maintains no right upper quadrant pain, however will obtain right upper quadrant ultrasound to ensure homogeneity of liver. States she feels much better after Zofran, Bentyl and IV fluids Ultrasound shows dilated common bile duct with no evidence of stone. Discussed with gastroenterology, Dr. Michail Sermon recommends medical admission for further evaluation, will be available for consult. Discussed with hospitalist, [facility] PA-C. Patient admitted to medical service. Final diagnoses:  Lower abdominal  pain  Elevated LFTs        Comer Locket, PA-C 03/07/16 [place], MD 03/07/16 [phone]

## 2016-03-07 NOTE — ED Notes (Signed)
Attempted report 

## 2016-03-07 NOTE — Consult Note (Addendum)
Referring Provider: Dr. Maudie Mercury Primary Care Physician:  Philemon Kingdom, MD Primary Gastroenterologist:  Althia Forts  Reason for Consultation:  Abdominal pain; Diarrhea; Elevated LFTs  HPI: Sydney [name] is a 50 y.o. female with remote history of cholecystectomy (1990) and gastric bypass in 2009 who had the acute onset of severe constant epigastric pain that felt like a gallbladder attack. Also developed nausea without vomiting. Had several episodes of nonbloody diarrhea this past Thursday. Denies melena, hematochezia, fevers, chills, chest pain, shortness of breath. U/S showed a dilated CBD without stones noted. S/P splenectomy. TB 0.8, ALP 190, AST 951, ALT 474, Lipase 28. Denies alcohol, denies any new meds. Denies NSAIDs. Denies Tylenol. Denies herbal therapies. Her parents are at the bedside.   Past Medical History  Diagnosis Date  . Right ovarian cyst   . Abdominal pain, RLQ     Past Surgical History  Procedure Laterality Date  . Vaginal hysterectomy  1999  . Pubovaginal sling  2001  . 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 Tonsillectomy  1975  . Nasal sinus surgery  X3    LAST ONE 1996  . Orif right elbow fx  2004  . Mini gastric bypass  2009  . Cholecystectomy  1990  . Ganglion cyst removed  X3   LAST ONE 1989    LEFT WRIST  . Lipoma removed  2008    RIGHT PALM  . Diagnostic laparoscopy  1998    ENDOMETRIOSIS  . Cystoscopy  02/17/2012    Procedure: CYSTOSCOPY;  Surgeon: Darlyn Chamber, MD;  Location: [facility];  Service: Gynecology;;    Prior to Admission medications   Medication Sig Start Date End Date Taking? Authorizing Provider  IRON PO Take 1 tablet by mouth 2 (two) times daily.   Yes Historical Provider, MD  Multiple Vitamin (MULTIVITAMIN) tablet Take 1 tablet by mouth 3 (three) times daily.   Yes Historical Provider, MD  Vitamin D, Ergocalciferol, (DRISDOL) 50000 units CAPS capsule Take 1 capsule (50,000 Units total) by mouth 3 (three) times a week.  01/02/16  Yes Philemon Kingdom, MD  meclizine (ANTIVERT) 25 MG tablet Take 1 tablet (25 mg total) by mouth 3 (three) times daily as needed for dizziness. Patient not taking: Reported on 03/07/2016 01/28/15   Davonna Belling, MD  naproxen sodium (ANAPROX DS) 550 MG tablet Take 1 tablet (550 mg total) by mouth 2 (two) times daily with a meal. Patient not taking: Reported on 03/07/2016 02/03/16   Jaynee Eagles, PA-C    Scheduled Meds: . dicyclomine  20 mg Intramuscular Once  . enoxaparin (LOVENOX) injection  40 mg Subcutaneous Q24H   Continuous Infusions: . sodium chloride 1,000 mL (03/07/16 1545)   PRN Meds:.ondansetron **OR** ondansetron (ZOFRAN) IV  Allergies as of 03/07/2016 - Review Complete 03/07/2016  Allergen Reaction Noted  . Codeine Hives 12/12/2007    Family History  Problem Relation Age of Onset  . Heart failure Mother   . Heart failure Father   . Cancer Other     Social History   Social History  . Marital Status: Married    Spouse Name: N/A  . Number of Children: N/A  . Years of Education: N/A   Occupational History  . Not on file.   Social History Main Topics  . Smoking status: Never Smoker   . Smokeless tobacco: Never Used  . Alcohol Use: Yes     Comment: OCCASIONAL  . Drug Use: No  . Sexual Activity:  Partners: Male   Other Topics Concern  . Not on file   Social History Narrative   Regular exercise: yes, walk 20 min daily   Caffeine use: 1 cup of coffee daily    Review of Systems: All negative except as stated above in HPI.  Physical Exam: Vital signs: Filed Vitals:   03/07/16 1630 03/07/16 1740  BP: 122/62 116/67  Pulse: 66 64  Temp:  99.1 F (37.3 C)  Resp: 16 16     General:   Alert,  Well-developed, well-nourished, pleasant and cooperative in NAD Head: atraumatic Eyes: anicteric sclera ENT: oropharynx clear Neck: supple, nontender Lungs:  Clear throughout to auscultation.   No wheezes, crackles, or rhonchi. No acute distress. Heart:   Regular rate and rhythm; no murmurs, clicks, rubs,  or gallops. Abdomen: soft, nontender, nondistended, +BS  Rectal:  Deferred Ext: no edema  GI:  Lab Results:  Recent Labs  03/07/16 1026  WBC 11.7*  HGB 11.9*  HCT 38.1  PLT 495*   BMET  Recent Labs  03/07/16 1026  NA 138  K 3.9  CL 112*  CO2 24  GLUCOSE 96  BUN 11  CREATININE 0.63  CALCIUM 8.2*   LFT  Recent Labs  03/07/16 1026  PROT 6.8  ALBUMIN 3.2*  AST 951*  ALT 474*  ALKPHOS 190*  BILITOT 0.8   PT/INR  Recent Labs  03/07/16 1706  LABPROT 15.4*  INR 1.20     Studies/Results: US Abdomen Complete  03/07/2016  CLINICAL DATA:  Abdominal pain with bloating and diarrhea. EXAM: ABDOMEN ULTRASOUND COMPLETE COMPARISON:  None. FINDINGS: Gallbladder: Surgically absent. Common bile duct: Diameter: 11 mm. Liver: No focal lesion identified. Within normal limits in parenchymal echogenicity. IVC: No abnormality visualized. Pancreas: Not well seen secondary to overlying bowel gas. Spleen: Surgically absent. Right Kidney: Length: 11.5 cm. Echogenicity within normal limits. No mass or hydronephrosis visualized. Left Kidney: Length: 12.1 cm. Echogenicity within normal limits. No mass or hydronephrosis visualized. Abdominal aorta: No aneurysm visualized. Other findings: None. IMPRESSION: 1. Dilated common bile duct in this patient status post cholecystectomy. Correlation with liver function tests recommended. 2. No findings to explain the patient's history of abdominal pain with bloating and diarrhea. Electronically Signed   By: Misty Stanley M.D.   On: 03/07/2016 12:56    Impression/Plan: 50 yo with acute onset of epigastric pain and markedly elevated transminases with a normal bilirubin level. Suspect the source is intrahepatic. Denies toxin ingestion or meds that could cause hepatitis but agree with hepatitis checks and toxin checks. Doubt acetaminophen toxicity. Autoimmune hepatitis possible. Levels too high to be from  thyroid or celiac disease. Doubt a retained stone or de novo biliary stone. Agree with MRCP to further evaluate biliary tree. May need autoimmune markers checked if levels remain elevated and MRCP unrevealing and if hepatitis panel negative and workup unrevealing, then could consider trial of steroids but too early to start that right now since workup is incomplete. After MRCP done can start clear liquid diet. [facility] to follow up tomorrow.      Randall C.  03/07/2016, 6:26 PM  Pager [phone]  If no answer or after 5 PM call [phone]

## 2016-03-07 NOTE — ED Notes (Signed)
Pt. Transported to US at this time

## 2016-03-07 NOTE — Progress Notes (Addendum)
Patient trasfered from ED to [phone] via wheelchair; alert and oriented x 4; complaints of abdominal pain (4/10); IV saline locked in RH; skin intact. Orient patient to room and unit; gave patient care guide; instructed how to use the call bell and  fall risk precautions. Will continue to monitor the patient.

## 2016-03-08 ENCOUNTER — Observation Stay (HOSPITAL_COMMUNITY): Payer: Managed Care, Other (non HMO)

## 2016-03-08 DIAGNOSIS — E870 Hyperosmolality and hypernatremia: Secondary | ICD-10-CM

## 2016-03-08 DIAGNOSIS — R110 Nausea: Secondary | ICD-10-CM | POA: Diagnosis not present

## 2016-03-08 DIAGNOSIS — R1013 Epigastric pain: Secondary | ICD-10-CM | POA: Diagnosis not present

## 2016-03-08 DIAGNOSIS — R7989 Other specified abnormal findings of blood chemistry: Secondary | ICD-10-CM | POA: Diagnosis not present

## 2016-03-08 LAB — COMPREHENSIVE METABOLIC PANEL
ALK PHOS: 164 U/L — AB (ref 38–126)
ALT: 273 U/L — AB (ref 14–54)
ANION GAP: 17 — AB (ref 5–15)
AST: 259 U/L — ABNORMAL HIGH (ref 15–41)
Albumin: 3 g/dL — ABNORMAL LOW (ref 3.5–5.0)
BUN: 7 mg/dL (ref 6–20)
CALCIUM: 9.2 mg/dL (ref 8.9–10.3)
CHLORIDE: 106 mmol/L (ref 101–111)
CO2: 23 mmol/L (ref 22–32)
CREATININE: 0.71 mg/dL (ref 0.44–1.00)
Glucose, Bld: 86 mg/dL (ref 65–99)
Potassium: 3.7 mmol/L (ref 3.5–5.1)
Sodium: 146 mmol/L — ABNORMAL HIGH (ref 135–145)
Total Bilirubin: 0.7 mg/dL (ref 0.3–1.2)
Total Protein: 6.3 g/dL — ABNORMAL LOW (ref 6.5–8.1)

## 2016-03-08 LAB — HEPATITIS PANEL, ACUTE
HCV Ab: <0.1 s/co ratio (ref 0.0–0.9)
HEP A IGM: NEGATIVE
HEP B C IGM: NEGATIVE
HEP B S AG: NEGATIVE

## 2016-03-08 LAB — CBC
HCT: 36.3 % (ref 36.0–46.0)
HEMOGLOBIN: 11.3 g/dL — AB (ref 12.0–15.0)
MCH: 27.4 pg (ref 26.0–34.0)
MCHC: 31.1 g/dL (ref 30.0–36.0)
MCV: 87.9 fL (ref 78.0–100.0)
PLATELETS: 453 10*3/uL — AB (ref 150–400)
RBC: 4.13 MIL/uL (ref 3.87–5.11)
RDW: 15.6 % — ABNORMAL HIGH (ref 11.5–15.5)
WBC: 10.6 10*3/uL — AB (ref 4.0–10.5)

## 2016-03-08 LAB — GLIADIN ANTIBODIES, SERUM
GLIADIN IGA: 3 U (ref 0–19)
GLIADIN IGG: 4 U (ref 0–19)

## 2016-03-08 LAB — ANTI-SMOOTH MUSCLE ANTIBODY, IGG: F-ACTIN AB IGG: 17 U (ref 0–19)

## 2016-03-08 LAB — MITOCHONDRIAL ANTIBODIES: Mitochondrial M2 Ab, IgG: 6.3 Units (ref 0.0–20.0)

## 2016-03-08 LAB — TISSUE TRANSGLUTAMINASE, IGA

## 2016-03-08 MED ORDER — PNEUMOCOCCAL VAC POLYVALENT 25 MCG/0.5ML IJ INJ
0.5000 mL | INJECTION | INTRAMUSCULAR | Status: AC
  Administered 2016-03-09: 0.5 mL via INTRAMUSCULAR
  Filled 2016-03-08: qty 0.5

## 2016-03-08 MED ORDER — GADOBENATE DIMEGLUMINE 529 MG/ML IV SOLN
20.0000 mL | Freq: Once | INTRAVENOUS | Status: AC | PRN
  Administered 2016-03-08: 20 mL via INTRAVENOUS

## 2016-03-08 MED ORDER — LORAZEPAM 2 MG/ML IJ SOLN
0.5000 mg | Freq: Once | INTRAMUSCULAR | Status: AC
  Administered 2016-03-08: 0.5 mg via INTRAVENOUS
  Filled 2016-03-08: qty 1

## 2016-03-08 NOTE — Progress Notes (Signed)
[facility] Progress Note  Subjective: Feels better, no pain, hungry, wants to eat.  Objective: Vital signs in last 24 hours: Temp:  [98.1 F (36.7 C)-99.1 F (37.3 C)] 98.1 F (36.7 C) (07/03 0523) Pulse Rate:  [64-71] 65 (07/03 0523) Resp:  [16-18] 16 (07/03 0523) BP: (106-122)/(62-80) 106/62 mmHg (07/03 0523) SpO2:  [96 %-99 %] 97 % (07/03 0523) Weight:  [87.998 kg (194 lb)] 87.998 kg (194 lb) (07/02 1740) Weight change:    PE: Abdomen soft  Lab Results: Results for orders placed or performed during the hospital encounter of 03/07/16 (from the past 24 hour(s))  Acetaminophen level     Status: Abnormal   Collection Time: 03/07/16  5:05 PM  Result Value Ref Range   Acetaminophen (Tylenol), Serum <10 (L) 10 - 30 ug/mL  APTT     Status: None   Collection Time: 03/07/16  5:06 PM  Result Value Ref Range   aPTT 31 24 - 37 seconds  Protime-INR     Status: Abnormal   Collection Time: 03/07/16  5:06 PM  Result Value Ref Range   Prothrombin Time 15.4 (H) 11.6 - 15.2 seconds   INR 1.20 0.00 - 1.49  Gamma GT     Status: Abnormal   Collection Time: 03/07/16  5:06 PM  Result Value Ref Range   GGT 54 (H) 7 - 50 U/L  C difficile quick scan w PCR reflex     Status: None   Collection Time: 03/07/16  8:08 PM  Result Value Ref Range   C Diff antigen NEGATIVE NEGATIVE   C Diff toxin NEGATIVE NEGATIVE   C Diff interpretation Negative for toxigenic C. difficile   Fecal lactoferrin *Canceled*     Status: None ()   Collection Time: 03/07/16  8:10 PM   Narrative   LIS Cancel (ORR/DE = Data Error)  Comprehensive metabolic panel     Status: Abnormal   Collection Time: 03/08/16  4:45 AM  Result Value Ref Range   Sodium 146 (H) 135 - 145 mmol/L   Potassium 3.7 3.5 - 5.1 mmol/L   Chloride 106 101 - 111 mmol/L   CO2 23 22 - 32 mmol/L   Glucose, Bld 86 65 - 99 mg/dL   BUN 7 6 - 20 mg/dL   Creatinine, Ser 0.71 0.44 - 1.00 mg/dL   Calcium 9.2 8.9 - 10.3 mg/dL   Total Protein  6.3 (L) 6.5 - 8.1 g/dL   Albumin 3.0 (L) 3.5 - 5.0 g/dL   AST 259 (H) 15 - 41 U/L   ALT 273 (H) 14 - 54 U/L   Alkaline Phosphatase 164 (H) 38 - 126 U/L   Total Bilirubin 0.7 0.3 - 1.2 mg/dL   GFR calc non Af Amer >60 >60 mL/min   GFR calc Af Amer >60 >60 mL/min   Anion gap 17 (H) 5 - 15  CBC     Status: Abnormal   Collection Time: 03/08/16  4:45 AM  Result Value Ref Range   WBC 10.6 (H) 4.0 - 10.5 K/uL   RBC 4.13 3.87 - 5.11 MIL/uL   Hemoglobin 11.3 (L) 12.0 - 15.0 g/dL   HCT 36.3 36.0 - 46.0 %   MCV 87.9 78.0 - 100.0 fL   MCH 27.4 26.0 - 34.0 pg   MCHC 31.1 30.0 - 36.0 g/dL   RDW 15.6 (H) 11.5 - 15.5 %   Platelets 453 (H) 150 - 400 K/uL    Studies/Results: US Abdomen Complete  03/07/2016  CLINICAL DATA:  Abdominal pain with bloating and diarrhea. EXAM: ABDOMEN ULTRASOUND COMPLETE COMPARISON:  None. FINDINGS: Gallbladder: Surgically absent. Common bile duct: Diameter: 11 mm. Liver: No focal lesion identified. Within normal limits in parenchymal echogenicity. IVC: No abnormality visualized. Pancreas: Not well seen secondary to overlying bowel gas. Spleen: Surgically absent. Right Kidney: Length: 11.5 cm. Echogenicity within normal limits. No mass or hydronephrosis visualized. Left Kidney: Length: 12.1 cm. Echogenicity within normal limits. No mass or hydronephrosis visualized. Abdominal aorta: No aneurysm visualized. Other findings: None. IMPRESSION: 1. Dilated common bile duct in this patient status post cholecystectomy. Correlation with liver function tests recommended. 2. No findings to explain the patient's history of abdominal pain with bloating and diarrhea. Electronically Signed   By: Misty Stanley M.D.   On: 03/07/2016 12:56      Assessment: Elevated liver function tests associated with biliary colic-type pain, MRCP normal for CBD stone, could've possibly passed a stone.  Plan: Advance diet, repeat liver function tests, await results of serologic workup for identifiable causes  of liver disease. Possibly home tomorrow.    Tayllor Breitenstein C 03/08/2016, 1:08 PM  Pager [phone] If no answer or after 5 PM call [phone]

## 2016-03-08 NOTE — Progress Notes (Signed)
Patient ID: Sydney [name], female   DOB: 05-Jan-1966, 50 y.o.   MRN: [id number]  PROGRESS NOTE    Sydney [name]  [id number] DOB: 1966/06/26 DOA: 03/07/2016  PCP: Philemon Kingdom, MD   Brief Narrative:  50 y.o. female with remote history of cholecystectomy (1990) and gastric bypass in 2009 who had the acute onset of severe constant epigastric pain started the day prior to the admission. Patient had nonbloody diarrhea. No associated vomiting. No fevers or chills. Patient was hemodynamically stable on the admission. Her liver function enzymes were as follows: AST 951, ALT 474, ALP 190 and a normal bilirubin level. Lipase was within normal limits. Abdominal ultrasound showed dilated common bile duct, status post cholecystectomy. GI has seen her in consultation.   Assessment & Plan:   Active Problems: Epigastric pain / nausea without vomiting / abnormal liver function tests - Abdominal ultrasound showed dilated common bile duct in this patient who is status post cholecystectomy - Liver function enzymes: AST 951, ALT 474, ALP 190 and normal bilirubin - LFT's trending down - Plan for MRCP today - Appreciate GI following and their recommendations  Leukocytosis - Likely reactive - No evidence of acute intra-abdominal infection - WBC count improving   Hypernatremia - Likely dehydration, GI losses - Continue IV fluids - F/U BMP in am     DVT prophylaxis: Lovenox subcutaneous Code Status: full code  Family Communication: Spouse at the bedside Disposition Plan: Home once cleared by GI   Consultants:   GI  Procedures:   None   Antimicrobials:   None   Subjective: No abdominal pain this am.  Objective: Filed Vitals:   03/07/16 1630 03/07/16 1740 03/07/16 2112 03/08/16 0523  BP: 122/62 116/67 108/65 106/62  Pulse: 66 64 71 65  Temp:  99.1 F (37.3 C) 98.4 F (36.9 C) 98.1 F (36.7 C)  TempSrc:  Oral    Resp: 16 16 18 16   Height:  5' 11.5" (1.816 m)      Weight:  87.998 kg (194 lb)    SpO2: 97% 96% 96% 97%    Intake/Output Summary (Last 24 hours) at 03/08/16 0851 Last data filed at 03/08/16 0636  Gross per 24 hour  Intake   1160 ml  Output      0 ml  Net   1160 ml   Filed Weights   03/07/16 1740  Weight: 87.998 kg (194 lb)    Examination:  General exam: Appears calm and comfortable  Respiratory system: Clear to auscultation. Respiratory effort normal. Cardiovascular system: S1 & S2 heard, RRR. No JVD, murmurs, rubs, gallops or clicks. No pedal edema. Gastrointestinal system: Abdomen is nondistended, soft and nontender. No organomegaly or masses felt. Normal bowel sounds heard. Central nervous system: Alert and oriented. No focal neurological deficits. Extremities: Symmetric 5 x 5 power. Skin: No rashes, lesions or ulcers Psychiatry: Judgement and insight appear normal. Mood & affect appropriate.   Data Reviewed: I have personally reviewed following labs and imaging studies  CBC:  Recent Labs Lab 03/07/16 1026 03/08/16 0445  WBC 11.7* 10.6*  HGB 11.9* 11.3*  HCT 38.1 36.3  MCV 87.4 87.9  PLT 495* 0000000*   Basic Metabolic Panel:  Recent Labs Lab 03/07/16 1026 03/08/16 0445  NA 138 146*  K 3.9 3.7  CL 112* 106  CO2 24 23  GLUCOSE 96 86  BUN 11 7  CREATININE 0.63 0.71  CALCIUM 8.2* 9.2   GFR: Estimated Creatinine Clearance: 105.3 mL/min (by C-G formula based  on Cr of 0.71). Liver Function Tests:  Recent Labs Lab 03/07/16 1026 03/08/16 0445  AST 951* 259*  ALT 474* 273*  ALKPHOS 190* 164*  BILITOT 0.8 0.7  PROT 6.8 6.3*  ALBUMIN 3.2* 3.0*    Recent Labs Lab 03/07/16 1026  LIPASE 28   No results for input(s): AMMONIA in the last 168 hours. Coagulation Profile:  Recent Labs Lab 03/07/16 1706  INR 1.20   Cardiac Enzymes: No results for input(s): CKTOTAL, CKMB, CKMBINDEX, TROPONINI in the last 168 hours. BNP (last 3 results) No results for input(s): PROBNP in the last 8760  hours. HbA1C: No results for input(s): HGBA1C in the last 72 hours. CBG: No results for input(s): GLUCAP in the last 168 hours. Lipid Profile: No results for input(s): CHOL, HDL, LDLCALC, TRIG, CHOLHDL, LDLDIRECT in the last 72 hours. Thyroid Function Tests: No results for input(s): TSH, T4TOTAL, FREET4, T3FREE, THYROIDAB in the last 72 hours. Anemia Panel: No results for input(s): VITAMINB12, FOLATE, FERRITIN, TIBC, IRON, RETICCTPCT in the last 72 hours. Urine analysis:    Component Value Date/Time   COLORURINE AMBER* 03/07/2016 1026   APPEARANCEUR CLOUDY* 03/07/2016 1026   LABSPEC 1.025 03/07/2016 1026   PHURINE 6.0 03/07/2016 1026   GLUCOSEU NEGATIVE 03/07/2016 1026   HGBUR NEGATIVE 03/07/2016 1026   BILIRUBINUR NEGATIVE 03/07/2016 1026   KETONESUR 15* 03/07/2016 1026   PROTEINUR NEGATIVE 03/07/2016 1026   NITRITE NEGATIVE 03/07/2016 1026   LEUKOCYTESUR NEGATIVE 03/07/2016 1026   Sepsis Labs: @LABRCNTIP (procalcitonin:4,lacticidven:4)   Recent Results (from the past 240 hour(s))  C difficile quick scan w PCR reflex     Status: None   Collection Time: 03/07/16  8:08 PM  Result Value Ref Range Status   C Diff antigen NEGATIVE NEGATIVE Final   C Diff toxin NEGATIVE NEGATIVE Final   C Diff interpretation Negative for toxigenic C. difficile  Final      Radiology Studies: US Abdomen Complete 03/07/2016  1. Dilated common bile duct in this patient status post cholecystectomy. Correlation with liver function tests recommended. 2. No findings to explain the patient's history of abdominal pain with bloating and diarrhea.    Scheduled Meds: . dicyclomine  20 mg Intramuscular Once  . enoxaparin (LOVENOX) injection  40 mg Subcutaneous Q24H  . LORazepam  0.5 mg Intravenous Once  . [START ON 03/09/2016] pneumococcal 23 valent vaccine  0.5 mL Intramuscular Tomorrow-1000   Continuous Infusions: . sodium chloride 1,000 mL (03/07/16 1545)       Time spent: 25 minutes  Greater  than 50% of the time spent on counseling and coordinating the care.   Leisa Lenz, MD [facility] Pager [phone]  If 7PM-7AM, please contact night-coverage [email or web address] Password [email or web address] 03/08/2016, 8:51 AM

## 2016-03-08 NOTE — Progress Notes (Signed)
Pt was taken down for MRI and found to be very claustrophobic. I have notified Dr.Harduk and there are no new orders at this time. Pt is on her way back to her room. She says that she wants to calm herself down and try later with medication. Will continue to monitor

## 2016-03-09 DIAGNOSIS — R197 Diarrhea, unspecified: Secondary | ICD-10-CM | POA: Diagnosis not present

## 2016-03-09 DIAGNOSIS — R740 Nonspecific elevation of levels of transaminase and lactic acid dehydrogenase [LDH]: Secondary | ICD-10-CM | POA: Diagnosis not present

## 2016-03-09 DIAGNOSIS — K862 Cyst of pancreas: Secondary | ICD-10-CM | POA: Diagnosis present

## 2016-03-09 DIAGNOSIS — R110 Nausea: Secondary | ICD-10-CM

## 2016-03-09 DIAGNOSIS — R7401 Elevation of levels of liver transaminase levels: Secondary | ICD-10-CM | POA: Diagnosis present

## 2016-03-09 NOTE — Progress Notes (Signed)
Patient ID: Sydney [name], female   DOB: 04-Jul-1966, 50 y.o.   MRN: [id number] [facility] Gastroenterology Progress Note  Sydney [name] 50 y.o. 1966/02/11   Subjective: Feels good. No N/V/abdominal pain. Tolerating solid food. Husband at bedside.  Objective: Vital signs: Filed Vitals:   03/08/16 2115 03/09/16 0542  BP: 123/76 107/60  Pulse: 85 70  Temp: 98.7 F (37.1 C) 97.9 F (36.6 C)  Resp: 18 18    Physical Exam: Gen: alert, no acute distress  HEENT: anicteric sclera CV: RRR Chest: CTA B Abd: soft, nontender, nondistended, +BS   Lab Results:  Recent Labs  03/07/16 1026 03/08/16 0445  NA 138 146*  K 3.9 3.7  CL 112* 106  CO2 24 23  GLUCOSE 96 86  BUN 11 7  CREATININE 0.63 0.71  CALCIUM 8.2* 9.2    Recent Labs  03/07/16 1026 03/08/16 0445  AST 951* 259*  ALT 474* 273*  ALKPHOS 190* 164*  BILITOT 0.8 0.7  PROT 6.8 6.3*  ALBUMIN 3.2* 3.0*    Recent Labs  03/07/16 1026 03/08/16 0445  WBC 11.7* 10.6*  HGB 11.9* 11.3*  HCT 38.1 36.3  MCV 87.4 87.9  PLT 495* 453*      Assessment/Plan: Improving LFTs - may have passed a stone. Clinically doing well. Stable for d/c home. F/U with me in 4-6 weeks. Office will call to arrange f/u LFTs for next week. Likely will need an EUS in near future due to question of pseudocyst vs cystic lesion in pancreas. Advised patient needs to establish with a primary doctor as well.  [place] C. 03/09/2016, 10:24 AM  Pager [phone]  If no answer or after 5 PM call [phone]

## 2016-03-09 NOTE — Progress Notes (Signed)
NURSING PROGRESS NOTE  Sydney [name] [id number] Discharge Data: 03/09/2016 12:03 PM Attending Provider: Annita Brod, MD [id number], Salena Saner, MD     Coralee Pesa to be D/C'd Home per [facility] order.  Discussed with the patient the After Visit Summary and all questions fully answered. All IV's discontinued with no bleeding noted. All belongings returned to patient for patient to take home. Pt walked downstairs to car.   Last Vital Signs:  Blood pressure 107/60, pulse 70, temperature 97.9 F (36.6 C), temperature source Oral, resp. rate 18, height 5' 11.5" (1.816 m), weight 87.998 kg (194 lb), SpO2 96 %.  Discharge Medication List   Medication List    TAKE these medications        IRON PO  Take 1 tablet by mouth 2 (two) times daily.     multivitamin tablet  Take 1 tablet by mouth 3 (three) times daily.     Vitamin D (Ergocalciferol) 50000 units Caps capsule  Commonly known as:  DRISDOL  Take 1 capsule (50,000 Units total) by mouth 3 (three) times a week.

## 2016-03-09 NOTE — Discharge Summary (Signed)
Discharge Summary  Sydney [name] [id number] DOB: 08/07/1966  PCP: Philemon Kingdom, MD  Admit date: 03/07/2016 Discharge date: 03/09/2016  Time spent: 25 minutes   Recommendations for Outpatient Follow-up:  Patient will follow-up with Dr. Cannon Kettle, gastroenterology in 4-6 weeks. That time decision be made about follow-up endoscopic ultrasound for evaluation of cystic structure seen on MRCP of pancreas.  Discharge Diagnoses:  Active Hospital Problems   Diagnosis Date Noted  . Transaminitis 03/09/2016  . Pancreatic cyst 03/09/2016  . Diarrhea 03/07/2016  . Nausea without vomiting 03/07/2016    Resolved Hospital Problems   Diagnosis Date Noted Date Resolved  No resolved problems to display.    Discharge Condition:  Improved, being discharged home   Diet recommended: regular diet  Filed Vitals:   03/08/16 2115 03/09/16 0542  BP: 123/76 107/60  Pulse: 85 70  Temp: 98.7 F (37.1 C) 97.9 F (36.6 C)  Resp: 18 18    History of present illness:  50 y.o. female with remote history of cholecystectomy (1990) and gastric bypass in 2009 who had the acute onset of severe constant epigastric pain started the day prior to the admission. Patient had nonbloody diarrhea. No associated vomiting. No fevers or chills. Patient was hemodynamically stable on the admission. Her liver function enzymes were as follows: AST 951, ALT 474, ALP 190 and a normal bilirubin level. Lipase was within normal limits.  Hospital Course:  Principal Problem:   Transaminitis:  GI consulted. Abdominal ultrasound noted dilated common bile duct status post cholecystectomy. Patient monitored. LFT started to trend down by following day. Patient underwent MRCP which noted no evidence of stone. Common bile duct appeared normal. By 7/4, patient eating solid food without incident. Transaminases down to 259/273 (was 951/474 on admission) and patient felt to be stable for discharge. She'll follow up with GI as  outpatient for repeat liver function tests Active Problems:   Diarrhea: Unclear etiology. Resolved soon after admission   Nausea without vomiting: Secondary to pain, suspect passed common bile duct stone   Pancreatic cyst: Cystic structure noted incidentally on MRCP. Possible pseudocyst. Plan will be for GI follow-up and at that time endoscopic ultrasound versus repeat MRCP in one year    Procedures  None  Consultations:  GI    Discharge Exam: BP 107/60 mmHg  Pulse 70  Temp(Src) 97.9 F (36.6 C) (Oral)  Resp 18  Ht 5' 11.5" (1.816 m)  Wt 87.998 kg (194 lb)  BMI 26.68 kg/m2  SpO2 96%  General: alert and oriented 3   Cardiovascular: regular rate and rhythm, S1-S2   Respiratory: clear to auscultation bilaterally    Discharge Instructions You were cared for by a hospitalist during your hospital stay. If you have any questions about your discharge medications or the care you received while you were in the hospital after you are discharged, you can call the unit and asked to speak with the hospitalist on call if the hospitalist that took care of you is not available. Once you are discharged, your primary care physician will handle any further medical issues. Please note that NO REFILLS for any discharge medications will be authorized once you are discharged, as it is imperative that you return to your primary care physician (or establish a relationship with a primary care physician if you do not have one) for your aftercare needs so that they can reassess your need for medications and monitor your lab values.  Discharge Instructions    Diet general  Complete by:  As directed      Increase activity slowly    Complete by:  As directed             Medication List    TAKE these medications        IRON PO  Take 1 tablet by mouth 2 (two) times daily.     multivitamin tablet  Take 1 tablet by mouth 3 (three) times daily.     Vitamin D (Ergocalciferol) 50000 units Caps  capsule  Commonly known as:  DRISDOL  Take 1 capsule (50,000 Units total) by mouth 3 (three) times a week.       Allergies  Allergen Reactions  . Codeine Hives       Follow-up Information    Follow up with Lear Ng., MD.   Specialty:  Gastroenterology   Why:  4-6 weeks.  Office will call you to set this up.  (They will also set up an endoscopic ultrasound or followup repeat MRI)   Contact information:   [address] [place] [address] [phone]        The results of significant diagnostics from this hospitalization (including imaging, microbiology, ancillary and laboratory) are listed below for reference.    Significant Diagnostic Studies: US Abdomen Complete  03/07/2016  CLINICAL DATA:  Abdominal pain with bloating and diarrhea. EXAM: ABDOMEN ULTRASOUND COMPLETE COMPARISON:  None. FINDINGS: Gallbladder: Surgically absent. Common bile duct: Diameter: 11 mm. Liver: No focal lesion identified. Within normal limits in parenchymal echogenicity. IVC: No abnormality visualized. Pancreas: Not well seen secondary to overlying bowel gas. Spleen: Surgically absent. Right Kidney: Length: 11.5 cm. Echogenicity within normal limits. No mass or hydronephrosis visualized. Left Kidney: Length: 12.1 cm. Echogenicity within normal limits. No mass or hydronephrosis visualized. Abdominal aorta: No aneurysm visualized. Other findings: None. IMPRESSION: 1. Dilated common bile duct in this patient status post cholecystectomy. Correlation with liver function tests recommended. 2. No findings to explain the patient's history of abdominal pain with bloating and diarrhea. Electronically Signed   By: Misty Stanley M.D.   On: 03/07/2016 12:56   Mr 3d Recon At Scanner  03/08/2016  CLINICAL DATA:  Abdominal pain. Elevated liver function tests. Gastric bypass 8 years ago. Claustrophobia. EXAM: MRI ABDOMEN WITHOUT AND WITH CONTRAST (INCLUDING MRCP) TECHNIQUE: Multiplanar multisequence MR  imaging of the abdomen was performed both before and after the administration of intravenous contrast. Heavily T2-weighted images of the biliary and pancreatic ducts were obtained, and three-dimensional MRCP images were rendered by post processing. CONTRAST:  19mL MULTIHANCE GADOBENATE DIMEGLUMINE 529 MG/ML IV SOLN COMPARISON:  Ultrasound of 03/07/2016. FINDINGS: Lower chest: Normal heart size without pericardial or pleural effusion. Hepatobiliary: Normal liver. Cholecystectomy. Upper normal intrahepatic ducts after cholecystectomy, including a 6 mm left hepatic duct on image 38/series 4. The common duct is within normal limits after cholecystectomy. Example at 9 mm in the porta hepatis on image 46/series 4. Tapers gradually to 7 mm in the pancreatic head. Upper normal 10 mm after cholecystectomy. No obstructive stone or mass. Pancreas: No pancreatic duct dilatation or acute pancreatitis. A 9 mm cystic lesion identified within the pancreatic head on image 25/series 3 and image 46/ series 14002. No post-contrast enhancement. Spleen: Diminutive spleen, without focal abnormality. Adrenals/Urinary Tract: Normal right adrenal gland. Left adrenal nodularity on the order of 12 mm on image 34/ series 14002. Decreased signal on out of phase imaging, most consistent with an adenoma. Too small to characterize interpolar right renal lesion.  Normal left kidney, without hydronephrosis. Stomach/Bowel: Status post Roux-en-Y gastric bypass. No acute complication. Otherwise normal stomach and abdominal bowel loops. Vascular/Lymphatic: Normal caliber of the aorta and branch vessels. No retroperitoneal or retrocrural adenopathy. Other: No ascites. Musculoskeletal: No acute osseous abnormality. IMPRESSION: 1. Status post cholecystectomy. Intra and extrahepatic ducts within normal limits after prior cholecystectomy. No choledocholithiasis or other explanation for elevated liver function tests. 2. Status post gastric bypass, without acute  complication. 3. Subtle cystic lesion within the pancreatic head is likely a pseudocyst. Indolent cystic neoplasm felt less likely. Per consensus criteria, this warrants followup with pre and post contrast abdominal MRI at 1 year. Given claustrophobia, pancreatic protocol pre and post contrast CT could alternatively be performed at 1 year. This recommendation follows ACR consensus guidelines: Managing Incidental Findings on Abdominal CT: White Paper of the ACR Incidental Findings Committee. J Am Coll Radiol 2010;7:754-773. 4. Left adrenal adenoma. Electronically Signed   By: Abigail Miyamoto M.D.   On: 03/08/2016 13:39   Mr Abd W/wo Cm/mrcp  03/08/2016  CLINICAL DATA:  Abdominal pain. Elevated liver function tests. Gastric bypass 8 years ago. Claustrophobia. EXAM: MRI ABDOMEN WITHOUT AND WITH CONTRAST (INCLUDING MRCP) TECHNIQUE: Multiplanar multisequence MR imaging of the abdomen was performed both before and after the administration of intravenous contrast. Heavily T2-weighted images of the biliary and pancreatic ducts were obtained, and three-dimensional MRCP images were rendered by post processing. CONTRAST:  50mL MULTIHANCE GADOBENATE DIMEGLUMINE 529 MG/ML IV SOLN COMPARISON:  Ultrasound of 03/07/2016. FINDINGS: Lower chest: Normal heart size without pericardial or pleural effusion. Hepatobiliary: Normal liver. Cholecystectomy. Upper normal intrahepatic ducts after cholecystectomy, including a 6 mm left hepatic duct on image 38/series 4. The common duct is within normal limits after cholecystectomy. Example at 9 mm in the porta hepatis on image 46/series 4. Tapers gradually to 7 mm in the pancreatic head. Upper normal 10 mm after cholecystectomy. No obstructive stone or mass. Pancreas: No pancreatic duct dilatation or acute pancreatitis. A 9 mm cystic lesion identified within the pancreatic head on image 25/series 3 and image 46/ series 14002. No post-contrast enhancement. Spleen: Diminutive spleen, without focal  abnormality. Adrenals/Urinary Tract: Normal right adrenal gland. Left adrenal nodularity on the order of 12 mm on image 34/ series 14002. Decreased signal on out of phase imaging, most consistent with an adenoma. Too small to characterize interpolar right renal lesion. Normal left kidney, without hydronephrosis. Stomach/Bowel: Status post Roux-en-Y gastric bypass. No acute complication. Otherwise normal stomach and abdominal bowel loops. Vascular/Lymphatic: Normal caliber of the aorta and branch vessels. No retroperitoneal or retrocrural adenopathy. Other: No ascites. Musculoskeletal: No acute osseous abnormality. IMPRESSION: 1. Status post cholecystectomy. Intra and extrahepatic ducts within normal limits after prior cholecystectomy. No choledocholithiasis or other explanation for elevated liver function tests. 2. Status post gastric bypass, without acute complication. 3. Subtle cystic lesion within the pancreatic head is likely a pseudocyst. Indolent cystic neoplasm felt less likely. Per consensus criteria, this warrants followup with pre and post contrast abdominal MRI at 1 year. Given claustrophobia, pancreatic protocol pre and post contrast CT could alternatively be performed at 1 year. This recommendation follows ACR consensus guidelines: Managing Incidental Findings on Abdominal CT: White Paper of the ACR Incidental Findings Committee. J Am Coll Radiol 2010;7:754-773. 4. Left adrenal adenoma. Electronically Signed   By: Abigail Miyamoto M.D.   On: 03/08/2016 13:39    Microbiology: Recent Results (from the past 240 hour(s))  C difficile quick scan w PCR reflex     Status:  None   Collection Time: 03/07/16  8:08 PM  Result Value Ref Range Status   C Diff antigen NEGATIVE NEGATIVE Final   C Diff toxin NEGATIVE NEGATIVE Final   C Diff interpretation Negative for toxigenic C. difficile  Final     Labs: Basic Metabolic Panel:  Recent Labs Lab 03/07/16 1026 03/08/16 0445  NA 138 146*  K 3.9 3.7  CL  112* 106  CO2 24 23  GLUCOSE 96 86  BUN 11 7  CREATININE 0.63 0.71  CALCIUM 8.2* 9.2   Liver Function Tests:  Recent Labs Lab 03/07/16 1026 03/08/16 0445  AST 951* 259*  ALT 474* 273*  ALKPHOS 190* 164*  BILITOT 0.8 0.7  PROT 6.8 6.3*  ALBUMIN 3.2* 3.0*    Recent Labs Lab 03/07/16 1026  LIPASE 28   No results for input(s): AMMONIA in the last 168 hours. CBC:  Recent Labs Lab 03/07/16 1026 03/08/16 0445  WBC 11.7* 10.6*  HGB 11.9* 11.3*  HCT 38.1 36.3  MCV 87.4 87.9  PLT 495* 453*   Cardiac Enzymes: No results for input(s): CKTOTAL, CKMB, CKMBINDEX, TROPONINI in the last 168 hours. BNP: BNP (last 3 results) No results for input(s): BNP in the last 8760 hours.  ProBNP (last 3 results) No results for input(s): PROBNP in the last 8760 hours.  CBG: No results for input(s): GLUCAP in the last 168 hours.     Signed:  Annita Brod, MD [facility] 03/09/2016, 11:27 AM

## 2016-03-11 LAB — RETICULIN ANTIBODIES, IGA W TITER: Reticulin Ab, IgA: NEGATIVE titer (ref <2.5)

## 2016-03-12 LAB — FECAL LACTOFERRIN, QUANT

## 2016-03-13 LAB — STOOL CULTURE: E COLI SHIGA TOXIN ASSAY: NEGATIVE

## 2016-03-13 LAB — STOOL CULTURE REFLEX - CMPCXR

## 2016-03-13 LAB — STOOL CULTURE REFLEX - RSASHR

## 2016-03-26 ENCOUNTER — Telehealth: Payer: Self-pay | Admitting: Internal Medicine

## 2016-03-26 NOTE — Telephone Encounter (Signed)
yes

## 2016-03-26 NOTE — Telephone Encounter (Signed)
Dr Ronnald Ramp,  This patient is requesting to be taken under your care. You also care for her husband, Lanny Hurst,   Are you able to accept her as a patient?

## 2016-03-29 NOTE — Telephone Encounter (Signed)
Called left vm advising  °

## 2016-05-12 ENCOUNTER — Ambulatory Visit: Payer: Managed Care, Other (non HMO) | Admitting: Internal Medicine

## 2016-05-17 ENCOUNTER — Encounter: Payer: Self-pay | Admitting: Internal Medicine

## 2016-05-17 ENCOUNTER — Ambulatory Visit (INDEPENDENT_AMBULATORY_CARE_PROVIDER_SITE_OTHER): Payer: Managed Care, Other (non HMO) | Admitting: Internal Medicine

## 2016-05-17 ENCOUNTER — Other Ambulatory Visit (INDEPENDENT_AMBULATORY_CARE_PROVIDER_SITE_OTHER): Payer: Managed Care, Other (non HMO)

## 2016-05-17 VITALS — BP 130/88 | HR 67 | Temp 97.8°F | Resp 16 | Ht 71.5 in | Wt 189.0 lb

## 2016-05-17 DIAGNOSIS — Z23 Encounter for immunization: Secondary | ICD-10-CM

## 2016-05-17 DIAGNOSIS — D509 Iron deficiency anemia, unspecified: Secondary | ICD-10-CM

## 2016-05-17 DIAGNOSIS — E213 Hyperparathyroidism, unspecified: Secondary | ICD-10-CM

## 2016-05-17 DIAGNOSIS — D539 Nutritional anemia, unspecified: Secondary | ICD-10-CM | POA: Diagnosis not present

## 2016-05-17 DIAGNOSIS — Z0000 Encounter for general adult medical examination without abnormal findings: Secondary | ICD-10-CM

## 2016-05-17 DIAGNOSIS — E559 Vitamin D deficiency, unspecified: Secondary | ICD-10-CM

## 2016-05-17 DIAGNOSIS — Z9884 Bariatric surgery status: Secondary | ICD-10-CM | POA: Diagnosis not present

## 2016-05-17 DIAGNOSIS — D519 Vitamin B12 deficiency anemia, unspecified: Secondary | ICD-10-CM

## 2016-05-17 DIAGNOSIS — H8113 Benign paroxysmal vertigo, bilateral: Secondary | ICD-10-CM

## 2016-05-17 DIAGNOSIS — D72829 Elevated white blood cell count, unspecified: Secondary | ICD-10-CM

## 2016-05-17 DIAGNOSIS — H8110 Benign paroxysmal vertigo, unspecified ear: Secondary | ICD-10-CM | POA: Insufficient documentation

## 2016-05-17 LAB — COMPREHENSIVE METABOLIC PANEL
ALT: 17 U/L (ref 0–35)
AST: 18 U/L (ref 0–37)
Albumin: 3.8 g/dL (ref 3.5–5.2)
Alkaline Phosphatase: 94 U/L (ref 39–117)
BILIRUBIN TOTAL: 0.3 mg/dL (ref 0.2–1.2)
BUN: 9 mg/dL (ref 6–23)
CALCIUM: 8.3 mg/dL — AB (ref 8.4–10.5)
CHLORIDE: 106 meq/L (ref 96–112)
CO2: 27 meq/L (ref 19–32)
CREATININE: 0.66 mg/dL (ref 0.40–1.20)
GFR: 101.07 mL/min (ref >60.00)
GLUCOSE: 90 mg/dL (ref 70–99)
Potassium: 3.9 mEq/L (ref 3.5–5.1)
SODIUM: 139 meq/L (ref 135–145)
Total Protein: 7.1 g/dL (ref 6.0–8.3)

## 2016-05-17 LAB — VITAMIN B12: Vitamin B-12: 329 pg/mL (ref 211–911)

## 2016-05-17 LAB — CBC WITH DIFFERENTIAL/PLATELET
BASOS ABS: 0.1 10*3/uL (ref 0.0–0.1)
BASOS PCT: 0.7 % (ref 0.0–3.0)
EOS ABS: 0.2 10*3/uL (ref 0.0–0.7)
Eosinophils Relative: 1.7 % (ref 0.0–5.0)
HEMATOCRIT: 36.2 % (ref 36.0–46.0)
Hemoglobin: 11.8 g/dL — ABNORMAL LOW (ref 12.0–15.0)
LYMPHS ABS: 4.1 10*3/uL — AB (ref 0.7–4.0)
LYMPHS PCT: 37.2 % (ref 12.0–46.0)
MCHC: 32.6 g/dL (ref 30.0–36.0)
MCV: 85.2 fl (ref 78.0–100.0)
Monocytes Absolute: 1.1 10*3/uL — ABNORMAL HIGH (ref 0.1–1.0)
Monocytes Relative: 10.2 % (ref 3.0–12.0)
NEUTROS ABS: 5.5 10*3/uL (ref 1.4–7.7)
NEUTROS PCT: 50.2 % (ref 43.0–77.0)
PLATELETS: 517 10*3/uL — AB (ref 150.0–400.0)
RBC: 4.25 Mil/uL (ref 3.87–5.11)
RDW: 16.5 % — AB (ref 11.5–15.5)
WBC: 11 10*3/uL — ABNORMAL HIGH (ref 4.0–10.5)

## 2016-05-17 LAB — IBC PANEL
IRON: 27 ug/dL — AB (ref 42–145)
Saturation Ratios: 5.1 % — ABNORMAL LOW (ref 20.0–50.0)
TRANSFERRIN: 377 mg/dL — AB (ref 212.0–360.0)

## 2016-05-17 LAB — FERRITIN: Ferritin: 5.5 ng/mL — ABNORMAL LOW (ref 10.0–291.0)

## 2016-05-17 LAB — LIPID PANEL
CHOL/HDL RATIO: 3
Cholesterol: 167 mg/dL (ref 0–200)
HDL: 50.2 mg/dL (ref >39.00)
LDL CALC: 100 mg/dL — AB (ref 0–99)
NONHDL: 116.45
TRIGLYCERIDES: 84 mg/dL (ref 0.0–149.0)
VLDL: 16.8 mg/dL (ref 0.0–40.0)

## 2016-05-17 LAB — VITAMIN D 25 HYDROXY (VIT D DEFICIENCY, FRACTURES): VITD: 23.47 ng/mL — AB (ref 30.00–100.00)

## 2016-05-17 LAB — FOLATE: FOLATE: 19.6 ng/mL (ref >5.9)

## 2016-05-17 LAB — TSH: TSH: 1.1 u[IU]/mL (ref 0.35–4.50)

## 2016-05-17 MED ORDER — MECLIZINE HCL 25 MG PO TABS: 25.0000 mg | ORAL_TABLET | Freq: Three times a day (TID) | ORAL | 2 refills | Status: DC | PRN

## 2016-05-17 MED ORDER — DIAZEPAM 5 MG PO TABS: 5.0000 mg | ORAL_TABLET | Freq: Four times a day (QID) | ORAL | 2 refills | Status: DC | PRN

## 2016-05-17 MED FILL — MECLIZINE 25 MG TABLET: 25 | 10 days supply | Qty: 30 | Fill #0

## 2016-05-17 MED FILL — diazePAM 5 MG TABS: 5 | 7 days supply | Qty: 30 | Fill #0

## 2016-05-17 NOTE — Patient Instructions (Signed)
Preventive Care for Adults, Female A healthy lifestyle and preventive care can promote health and wellness. Preventive health guidelines for women include the following key practices.  A routine yearly physical is a good way to check with your health care provider about your health and preventive screening. It is a chance to share any concerns and updates on your health and to receive a thorough exam.  Visit your dentist for a routine exam and preventive care every 6 months. Brush your teeth twice a day and floss once a day. Good oral hygiene prevents tooth decay and gum disease.  The frequency of eye exams is based on your age, health, family medical history, use of contact lenses, and other factors. Follow your health care provider's recommendations for frequency of eye exams.  Eat a healthy diet. Foods like vegetables, fruits, whole grains, low-fat dairy products, and lean protein foods contain the nutrients you need without too many calories. Decrease your intake of foods high in solid fats, added sugars, and salt. Eat the right amount of calories for you.Get information about a proper diet from your health care provider, if necessary.  Regular physical exercise is one of the most important things you can do for your health. Most adults should get at least 150 minutes of moderate-intensity exercise (any activity that increases your heart rate and causes you to sweat) each week. In addition, most adults need muscle-strengthening exercises on 2 or more days a week.  Maintain a healthy weight. The body mass index (BMI) is a screening tool to identify possible weight problems. It provides an estimate of body fat based on height and weight. Your health care provider can find your BMI and can help you achieve or maintain a healthy weight.For adults 20 years and older:  A BMI below 18.5 is considered underweight.  A BMI of 18.5 to 24.9 is normal.  A BMI of 25 to 29.9 is considered overweight.  A  BMI of 30 and above is considered obese.  Maintain normal blood lipids and cholesterol levels by exercising and minimizing your intake of saturated fat. Eat a balanced diet with plenty of fruit and vegetables. Blood tests for lipids and cholesterol should begin at age 45 and be repeated every 5 years. If your lipid or cholesterol levels are high, you are over 50, or you are at high risk for heart disease, you may need your cholesterol levels checked more frequently.Ongoing high lipid and cholesterol levels should be treated with medicines if diet and exercise are not working.  If you smoke, find out from your health care provider how to quit. If you do not use tobacco, do not start.  Lung cancer screening is recommended for adults aged 45-80 years who are at high risk for developing lung cancer because of a history of smoking. A yearly low-dose CT scan of the lungs is recommended for people who have at least a 30-pack-year history of smoking and are a current smoker or have quit within the past 15 years. A pack year of smoking is smoking an average of 1 pack of cigarettes a day for 1 year (for example: 1 pack a day for 30 years or 2 packs a day for 15 years). Yearly screening should continue until the smoker has stopped smoking for at least 15 years. Yearly screening should be stopped for people who develop a health problem that would prevent them from having lung cancer treatment.  If you are pregnant, do not drink alcohol. If you are  breastfeeding, be very cautious about drinking alcohol. If you are not pregnant and choose to drink alcohol, do not have more than 1 drink per day. One drink is considered to be 12 ounces (355 mL) of beer, 5 ounces (148 mL) of wine, or 1.5 ounces (44 mL) of liquor.  Avoid use of street drugs. Do not share needles with anyone. Ask for help if you need support or instructions about stopping the use of drugs.  High blood pressure causes heart disease and increases the risk  of stroke. Your blood pressure should be checked at least every 1 to 2 years. Ongoing high blood pressure should be treated with medicines if weight loss and exercise do not work.  If you are 55-79 years old, ask your health care provider if you should take aspirin to prevent strokes.  Diabetes screening is done by taking a blood sample to check your blood glucose level after you have not eaten for a certain period of time (fasting). If you are not overweight and you do not have risk factors for diabetes, you should be screened once every 3 years starting at age 45. If you are overweight or obese and you are 40-70 years of age, you should be screened for diabetes every year as part of your cardiovascular risk assessment.  Breast cancer screening is essential preventive care for women. You should practice "breast self-awareness." This means understanding the normal appearance and feel of your breasts and may include breast self-examination. Any changes detected, no matter how small, should be reported to a health care provider. Women in their 20s and 30s should have a clinical breast exam (CBE) by a health care provider as part of a regular health exam every 1 to 3 years. After age 40, women should have a CBE every year. Starting at age 40, women should consider having a mammogram (breast X-ray test) every year. Women who have a family history of breast cancer should talk to their health care provider about genetic screening. Women at a high risk of breast cancer should talk to their health care providers about having an MRI and a mammogram every year.  Breast cancer gene (BRCA)-related cancer risk assessment is recommended for women who have family members with BRCA-related cancers. BRCA-related cancers include breast, ovarian, tubal, and peritoneal cancers. Having family members with these cancers may be associated with an increased risk for harmful changes (mutations) in the breast cancer genes BRCA1 and  BRCA2. Results of the assessment will determine the need for genetic counseling and BRCA1 and BRCA2 testing.  Your health care provider may recommend that you be screened regularly for cancer of the pelvic organs (ovaries, uterus, and vagina). This screening involves a pelvic examination, including checking for microscopic changes to the surface of your cervix (Pap test). You may be encouraged to have this screening done every 3 years, beginning at age 21.  For women ages 30-65, health care providers may recommend pelvic exams and Pap testing every 3 years, or they may recommend the Pap and pelvic exam, combined with testing for human papilloma virus (HPV), every 5 years. Some types of HPV increase your risk of cervical cancer. Testing for HPV may also be done on women of any age with unclear Pap test results.  Other health care providers may not recommend any screening for nonpregnant women who are considered low risk for pelvic cancer and who do not have symptoms. Ask your health care provider if a screening pelvic exam is right for   you.  If you have had past treatment for cervical cancer or a condition that could lead to cancer, you need Pap tests and screening for cancer for at least 20 years after your treatment. If Pap tests have been discontinued, your risk factors (such as having a new sexual partner) need to be reassessed to determine if screening should resume. Some women have medical problems that increase the chance of getting cervical cancer. In these cases, your health care provider may recommend more frequent screening and Pap tests.  Colorectal cancer can be detected and often prevented. Most routine colorectal cancer screening begins at the age of 50 years and continues through age 75 years. However, your health care provider may recommend screening at an earlier age if you have risk factors for colon cancer. On a yearly basis, your health care provider may provide home test kits to check  for hidden blood in the stool. Use of a small camera at the end of a tube, to directly examine the colon (sigmoidoscopy or colonoscopy), can detect the earliest forms of colorectal cancer. Talk to your health care provider about this at age 50, when routine screening begins. Direct exam of the colon should be repeated every 5-10 years through age 75 years, unless early forms of precancerous polyps or small growths are found.  People who are at an increased risk for hepatitis B should be screened for this virus. You are considered at high risk for hepatitis B if:  You were born in a country where hepatitis B occurs often. Talk with your health care provider about which countries are considered high risk.  Your parents were born in a high-risk country and you have not received a shot to protect against hepatitis B (hepatitis B vaccine).  You have HIV or AIDS.  You use needles to inject street drugs.  You live with, or have sex with, someone who has hepatitis B.  You get hemodialysis treatment.  You take certain medicines for conditions like cancer, organ transplantation, and autoimmune conditions.  Hepatitis C blood testing is recommended for all people born from 1945 through 1965 and any individual with known risks for hepatitis C.  Practice safe sex. Use condoms and avoid high-risk sexual practices to reduce the spread of sexually transmitted infections (STIs). STIs include gonorrhea, chlamydia, syphilis, trichomonas, herpes, HPV, and human immunodeficiency virus (HIV). Herpes, HIV, and HPV are viral illnesses that have no cure. They can result in disability, cancer, and death.  You should be screened for sexually transmitted illnesses (STIs) including gonorrhea and chlamydia if:  You are sexually active and are younger than 24 years.  You are older than 24 years and your health care provider tells you that you are at risk for this type of infection.  Your sexual activity has changed  since you were last screened and you are at an increased risk for chlamydia or gonorrhea. Ask your health care provider if you are at risk.  If you are at risk of being infected with HIV, it is recommended that you take a prescription medicine daily to prevent HIV infection. This is called preexposure prophylaxis (PrEP). You are considered at risk if:  You are sexually active and do not regularly use condoms or know the HIV status of your partner(s).  You take drugs by injection.  You are sexually active with a partner who has HIV.  Talk with your health care provider about whether you are at high risk of being infected with HIV. If   you choose to begin PrEP, you should first be tested for HIV. You should then be tested every 3 months for as long as you are taking PrEP.  Osteoporosis is a disease in which the bones lose minerals and strength with aging. This can result in serious bone fractures or breaks. The risk of osteoporosis can be identified using a bone density scan. Women ages 67 years and over and women at risk for fractures or osteoporosis should discuss screening with their health care providers. Ask your health care provider whether you should take a calcium supplement or vitamin D to reduce the rate of osteoporosis.  Menopause can be associated with physical symptoms and risks. Hormone replacement therapy is available to decrease symptoms and risks. You should talk to your health care provider about whether hormone replacement therapy is right for you.  Use sunscreen. Apply sunscreen liberally and repeatedly throughout the day. You should seek shade when your shadow is shorter than you. Protect yourself by wearing long sleeves, pants, a wide-brimmed hat, and sunglasses year round, whenever you are outdoors.  Once a month, do a whole body skin exam, using a mirror to look at the skin on your back. Tell your health care provider of new moles, moles that have irregular borders, moles that  are larger than a pencil eraser, or moles that have changed in shape or color.  Stay current with required vaccines (immunizations).  Influenza vaccine. All adults should be immunized every year.  Tetanus, diphtheria, and acellular pertussis (Td, Tdap) vaccine. Pregnant women should receive 1 dose of Tdap vaccine during each pregnancy. The dose should be obtained regardless of the length of time since the last dose. Immunization is preferred during the 27th-36th week of gestation. An adult who has not previously received Tdap or who does not know her vaccine status should receive 1 dose of Tdap. This initial dose should be followed by tetanus and diphtheria toxoids (Td) booster doses every 10 years. Adults with an unknown or incomplete history of completing a 3-dose immunization series with Td-containing vaccines should begin or complete a primary immunization series including a Tdap dose. Adults should receive a Td booster every 10 years.  Varicella vaccine. An adult without evidence of immunity to varicella should receive 2 doses or a second dose if she has previously received 1 dose. Pregnant females who do not have evidence of immunity should receive the first dose after pregnancy. This first dose should be obtained before leaving the health care facility. The second dose should be obtained 4-8 weeks after the first dose.  Human papillomavirus (HPV) vaccine. Females aged 13-26 years who have not received the vaccine previously should obtain the 3-dose series. The vaccine is not recommended for use in pregnant females. However, pregnancy testing is not needed before receiving a dose. If a female is found to be pregnant after receiving a dose, no treatment is needed. In that case, the remaining doses should be delayed until after the pregnancy. Immunization is recommended for any person with an immunocompromised condition through the age of 61 years if she did not get any or all doses earlier. During the  3-dose series, the second dose should be obtained 4-8 weeks after the first dose. The third dose should be obtained 24 weeks after the first dose and 16 weeks after the second dose.  Zoster vaccine. One dose is recommended for adults aged 30 years or older unless certain conditions are present.  Measles, mumps, and rubella (MMR) vaccine. Adults born  before 1957 generally are considered immune to measles and mumps. Adults born in 1957 or later should have 1 or more doses of MMR vaccine unless there is a contraindication to the vaccine or there is laboratory evidence of immunity to each of the three diseases. A routine second dose of MMR vaccine should be obtained at least 28 days after the first dose for students attending postsecondary schools, health care workers, or international travelers. People who received inactivated measles vaccine or an unknown type of measles vaccine during 1963-1967 should receive 2 doses of MMR vaccine. People who received inactivated mumps vaccine or an unknown type of mumps vaccine before 1979 and are at high risk for mumps infection should consider immunization with 2 doses of MMR vaccine. For females of childbearing age, rubella immunity should be determined. If there is no evidence of immunity, females who are not pregnant should be vaccinated. If there is no evidence of immunity, females who are pregnant should delay immunization until after pregnancy. Unvaccinated health care workers born before 1957 who lack laboratory evidence of measles, mumps, or rubella immunity or laboratory confirmation of disease should consider measles and mumps immunization with 2 doses of MMR vaccine or rubella immunization with 1 dose of MMR vaccine.  Pneumococcal 13-valent conjugate (PCV13) vaccine. When indicated, a person who is uncertain of his immunization history and has no record of immunization should receive the PCV13 vaccine. All adults 65 years of age and older should receive this  vaccine. An adult aged 19 years or older who has certain medical conditions and has not been previously immunized should receive 1 dose of PCV13 vaccine. This PCV13 should be followed with a dose of pneumococcal polysaccharide (PPSV23) vaccine. Adults who are at high risk for pneumococcal disease should obtain the PPSV23 vaccine at least 8 weeks after the dose of PCV13 vaccine. Adults older than 49 years of age who have normal immune system function should obtain the PPSV23 vaccine dose at least 1 year after the dose of PCV13 vaccine.  Pneumococcal polysaccharide (PPSV23) vaccine. When PCV13 is also indicated, PCV13 should be obtained first. All adults aged 65 years and older should be immunized. An adult younger than age 65 years who has certain medical conditions should be immunized. Any person who resides in a nursing home or long-term care facility should be immunized. An adult smoker should be immunized. People with an immunocompromised condition and certain other conditions should receive both PCV13 and PPSV23 vaccines. People with human immunodeficiency virus (HIV) infection should be immunized as soon as possible after diagnosis. Immunization during chemotherapy or radiation therapy should be avoided. Routine use of PPSV23 vaccine is not recommended for American Indians, [place] Natives, or people younger than 65 years unless there are medical conditions that require PPSV23 vaccine. When indicated, people who have unknown immunization and have no record of immunization should receive PPSV23 vaccine. One-time revaccination 5 years after the first dose of PPSV23 is recommended for people aged 19-64 years who have chronic kidney failure, nephrotic syndrome, asplenia, or immunocompromised conditions. People who received 1-2 doses of PPSV23 before age 65 years should receive another dose of PPSV23 vaccine at age 65 years or later if at least 5 years have passed since the previous dose. Doses of PPSV23 are not  needed for people immunized with PPSV23 at or after age 65 years.  Meningococcal vaccine. Adults with asplenia or persistent complement component deficiencies should receive 2 doses of quadrivalent meningococcal conjugate (MenACWY-D) vaccine. The doses should be obtained   at least 2 months apart. Microbiologists working with certain meningococcal bacteria, [place] recruits, people at risk during an outbreak, and people who travel to or live in countries with a high rate of meningitis should be immunized. A first-year college student up through age 34 years who is living in a residence hall should receive a dose if she did not receive a dose on or after her 16th birthday. Adults who have certain high-risk conditions should receive one or more doses of vaccine.  Hepatitis A vaccine. Adults who wish to be protected from this disease, have certain high-risk conditions, work with hepatitis A-infected animals, work in hepatitis A research labs, or travel to or work in countries with a high rate of hepatitis A should be immunized. Adults who were previously unvaccinated and who anticipate close contact with an international adoptee during the first 60 days after arrival in the [place] from a country with a high rate of hepatitis A should be immunized.  Hepatitis B vaccine. Adults who wish to be protected from this disease, have certain high-risk conditions, may be exposed to blood or other infectious body fluids, are household contacts or sex partners of hepatitis B positive people, are clients or workers in certain care facilities, or travel to or work in countries with a high rate of hepatitis B should be immunized.  Haemophilus influenzae type b (Hib) vaccine. A previously unvaccinated person with asplenia or sickle cell disease or having a scheduled splenectomy should receive 1 dose of Hib vaccine. Regardless of previous immunization, a recipient of a hematopoietic stem cell transplant should receive a  3-dose series 6-12 months after her successful transplant. Hib vaccine is not recommended for adults with HIV infection. Preventive Services / Frequency Ages 35 to 4 years  Blood pressure check.** / Every 3-5 years.  Lipid and cholesterol check.** / Every 5 years beginning at age 60.  Clinical breast exam.** / Every 3 years for women in their 71s and 10s.  BRCA-related cancer risk assessment.** / For women who have family members with a BRCA-related cancer (breast, ovarian, tubal, or peritoneal cancers).  Pap test.** / Every 2 years from ages 76 through 26. Every 3 years starting at age 61 through age 76 or 93 with a history of 3 consecutive normal Pap tests.  HPV screening.** / Every 3 years from ages 37 through ages 60 to 51 with a history of 3 consecutive normal Pap tests.  Hepatitis C blood test.** / For any individual with known risks for hepatitis C.  Skin self-exam. / Monthly.  Influenza vaccine. / Every year.  Tetanus, diphtheria, and acellular pertussis (Tdap, Td) vaccine.** / Consult your health care provider. Pregnant women should receive 1 dose of Tdap vaccine during each pregnancy. 1 dose of Td every 10 years.  Varicella vaccine.** / Consult your health care provider. Pregnant females who do not have evidence of immunity should receive the first dose after pregnancy.  HPV vaccine. / 3 doses over 6 months, if 93 and younger. The vaccine is not recommended for use in pregnant females. However, pregnancy testing is not needed before receiving a dose.  Measles, mumps, rubella (MMR) vaccine.** / You need at least 1 dose of MMR if you were born in 1957 or later. You may also need a 2nd dose. For females of childbearing age, rubella immunity should be determined. If there is no evidence of immunity, females who are not pregnant should be vaccinated. If there is no evidence of immunity, females who are  pregnant should delay immunization until after pregnancy.  Pneumococcal  13-valent conjugate (PCV13) vaccine.** / Consult your health care provider.  Pneumococcal polysaccharide (PPSV23) vaccine.** / 1 to 2 doses if you smoke cigarettes or if you have certain conditions.  Meningococcal vaccine.** / 1 dose if you are age 68 to 8 years and a Market researcher living in a residence hall, or have one of several medical conditions, you need to get vaccinated against meningococcal disease. You may also need additional booster doses.  Hepatitis A vaccine.** / Consult your health care provider.  Hepatitis B vaccine.** / Consult your health care provider.  Haemophilus influenzae type b (Hib) vaccine.** / Consult your health care provider. Ages 7 to 53 years  Blood pressure check.** / Every year.  Lipid and cholesterol check.** / Every 5 years beginning at age 25 years.  Lung cancer screening. / Every year if you are aged 11-80 years and have a 30-pack-year history of smoking and currently smoke or have quit within the past 15 years. Yearly screening is stopped once you have quit smoking for at least 15 years or develop a health problem that would prevent you from having lung cancer treatment.  Clinical breast exam.** / Every year after age 48 years.  BRCA-related cancer risk assessment.** / For women who have family members with a BRCA-related cancer (breast, ovarian, tubal, or peritoneal cancers).  Mammogram.** / Every year beginning at age 41 years and continuing for as long as you are in good health. Consult with your health care provider.  Pap test.** / Every 3 years starting at age 65 years through age 37 or 70 years with a history of 3 consecutive normal Pap tests.  HPV screening.** / Every 3 years from ages 72 years through ages 60 to 40 years with a history of 3 consecutive normal Pap tests.  Fecal occult blood test (FOBT) of stool. / Every year beginning at age 21 years and continuing until age 5 years. You may not need to do this test if you get  a colonoscopy every 10 years.  Flexible sigmoidoscopy or colonoscopy.** / Every 5 years for a flexible sigmoidoscopy or every 10 years for a colonoscopy beginning at age 35 years and continuing until age 48 years.  Hepatitis C blood test.** / For all people born from [birth year] through 1965 and any individual with known risks for hepatitis C.  Skin self-exam. / Monthly.  Influenza vaccine. / Every year.  Tetanus, diphtheria, and acellular pertussis (Tdap/Td) vaccine.** / Consult your health care provider. Pregnant women should receive 1 dose of Tdap vaccine during each pregnancy. 1 dose of Td every 10 years.  Varicella vaccine.** / Consult your health care provider. Pregnant females who do not have evidence of immunity should receive the first dose after pregnancy.  Zoster vaccine.** / 1 dose for adults aged 30 years or older.  Measles, mumps, rubella (MMR) vaccine.** / You need at least 1 dose of MMR if you were born in 1957 or later. You may also need a second dose. For females of childbearing age, rubella immunity should be determined. If there is no evidence of immunity, females who are not pregnant should be vaccinated. If there is no evidence of immunity, females who are pregnant should delay immunization until after pregnancy.  Pneumococcal 13-valent conjugate (PCV13) vaccine.** / Consult your health care provider.  Pneumococcal polysaccharide (PPSV23) vaccine.** / 1 to 2 doses if you smoke cigarettes or if you have certain conditions.  Meningococcal vaccine.** /  Consult your health care provider.  Hepatitis A vaccine.** / Consult your health care provider.  Hepatitis B vaccine.** / Consult your health care provider.  Haemophilus influenzae type b (Hib) vaccine.** / Consult your health care provider. Ages 64 years and over  Blood pressure check.** / Every year.  Lipid and cholesterol check.** / Every 5 years beginning at age 23 years.  Lung cancer screening. / Every year if you  are aged 16-80 years and have a 30-pack-year history of smoking and currently smoke or have quit within the past 15 years. Yearly screening is stopped once you have quit smoking for at least 15 years or develop a health problem that would prevent you from having lung cancer treatment.  Clinical breast exam.** / Every year after age 74 years.  BRCA-related cancer risk assessment.** / For women who have family members with a BRCA-related cancer (breast, ovarian, tubal, or peritoneal cancers).  Mammogram.** / Every year beginning at age 44 years and continuing for as long as you are in good health. Consult with your health care provider.  Pap test.** / Every 3 years starting at age 58 years through age 22 or 39 years with 3 consecutive normal Pap tests. Testing can be stopped between 65 and 70 years with 3 consecutive normal Pap tests and no abnormal Pap or HPV tests in the past 10 years.  HPV screening.** / Every 3 years from ages 64 years through ages 70 or 61 years with a history of 3 consecutive normal Pap tests. Testing can be stopped between 65 and 70 years with 3 consecutive normal Pap tests and no abnormal Pap or HPV tests in the past 10 years.  Fecal occult blood test (FOBT) of stool. / Every year beginning at age 40 years and continuing until age 27 years. You may not need to do this test if you get a colonoscopy every 10 years.  Flexible sigmoidoscopy or colonoscopy.** / Every 5 years for a flexible sigmoidoscopy or every 10 years for a colonoscopy beginning at age 7 years and continuing until age 32 years.  Hepatitis C blood test.** / For all people born from [birth year] through 1964 and any individual with known risks for hepatitis C.  Osteoporosis screening.** / A one-time screening for women ages 30 years and over and women at risk for fractures or osteoporosis.  Skin self-exam. / Monthly.  Influenza vaccine. / Every year.  Tetanus, diphtheria, and acellular pertussis (Tdap/Td)  vaccine.** / 1 dose of Td every 10 years.  Varicella vaccine.** / Consult your health care provider.  Zoster vaccine.** / 1 dose for adults aged 35 years or older.  Pneumococcal 13-valent conjugate (PCV13) vaccine.** / Consult your health care provider.  Pneumococcal polysaccharide (PPSV23) vaccine.** / 1 dose for all adults aged 46 years and older.  Meningococcal vaccine.** / Consult your health care provider.  Hepatitis A vaccine.** / Consult your health care provider.  Hepatitis B vaccine.** / Consult your health care provider.  Haemophilus influenzae type b (Hib) vaccine.** / Consult your health care provider. ** Family history and personal history of risk and conditions may change your health care provider's recommendations.   This information is not intended to replace advice given to you by your health care provider. Make sure you discuss any questions you have with your health care provider.   Document Released: 10/19/2001 Document Revised: 09/13/2014 Document Reviewed: 01/18/2011 Elsevier Interactive Patient Education [employer].

## 2016-05-17 NOTE — Progress Notes (Signed)
Subjective:  Patient ID: Sydney [name], female    DOB: 1965-10-21  Age: 50 y.o. MRN: [id number]  CC: Annual Exam and Anemia  NEW TO ME  HPI Sydney [name] presents for a CPX.  She was hospitalized a few months ago for a retained gallstone and ended up developing hepatitis and pancreatitis. She is feeling much better now and has had no recent episodes of abdominal pain, nausea, or vomiting. She has a long-standing history of anemia, vitamin deficiency, and secondary hyperparathyroidism status post gastric bypass surgery many years ago. She is followed periodically by endocrinology. 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 She feels well today and offers no complaints. She has had intermittent episodes of vertigo for several years now and tells me that when it occurs it responds well to diazepam and Antivert. She is requesting a refill.  History Sydney [name] has a past medical history of Abdominal pain, RLQ and Right ovarian cyst.   She has a past surgical history that includes Vaginal hysterectomy (1999); Pubovaginal sling (2001); Splenectomy (1977); Tonsillectomy (1975); Nasal sinus surgery (X3    LAST ONE 1996); ORIF RIGHT ELBOW FX (2004); MINI GASTRIC BYPASS (2009); Cholecystectomy (1990); GANGLION CYST REMOVED (X3   LAST ONE 1989); LIPOMA REMOVED (2008); Diagnostic laparoscopy (1998); and Cystoscopy (02/17/2012).   Her family history includes Cancer in her other; Diabetes in her father; Heart failure in her father and mother; Hypertension in her father.She reports that she has never smoked. She has never used smokeless tobacco. She reports that she drinks alcohol. She reports that she does not use drugs.  Outpatient Medications Prior to Visit  Medication Sig Dispense Refill  . Multiple Vitamin (MULTIVITAMIN) tablet Take 1 tablet by mouth 3 (three) times daily.    . Vitamin D, Ergocalciferol, (DRISDOL) 50000 units CAPS  capsule Take 1 capsule (50,000 Units total) by mouth 3 (three) times a week. 24 capsule 1  . IRON PO Take 1 tablet by mouth 2 (two) times daily.     No facility-administered medications prior to visit.     ROS Review of Systems  Constitutional: Negative.  Negative for activity change, appetite change, chills, diaphoresis, fatigue, fever and unexpected weight change.  HENT: Negative.  Negative for sinus pressure, sore throat and trouble swallowing.   Eyes: Negative.  Negative for visual disturbance.  Respiratory: Negative for cough, choking, chest tightness, shortness of breath and stridor.   Cardiovascular: Negative.  Negative for chest pain, palpitations and leg swelling.  Gastrointestinal: Negative.  Negative for abdominal pain, blood in stool, constipation, diarrhea, nausea and vomiting.  Endocrine: Negative.  Negative for polyuria.  Genitourinary: Negative.  Negative for difficulty urinating.  Musculoskeletal: Negative.  Negative for arthralgias, back pain, joint swelling, myalgias and neck pain.  Skin: Negative.  Negative for color change, pallor and rash.  Allergic/Immunologic: Negative.   Neurological: Negative.  Negative for dizziness, tremors, weakness and numbness.  Hematological: Negative.  Negative for adenopathy. Does not bruise/bleed easily.  Psychiatric/Behavioral: Negative.     Objective:  BP 130/88 (BP Location: Left Arm, Patient Position: Sitting, Cuff Size: Normal)   Pulse 67   Temp 97.8 F (36.6 C) (Oral)   Resp 16   Ht 5' 11.5" (1.816 m)   Wt 189 lb (85.7 kg)   SpO2 98%   BMI 25.99 kg/m   Physical Exam  Constitutional: She is oriented to person, place, and time. No distress.  HENT:  Mouth/Throat: Oropharynx  is clear and moist. No oropharyngeal exudate.  Eyes: Conjunctivae are normal. Right eye exhibits no discharge. Left eye exhibits no discharge. No scleral icterus.  Neck: Normal range of motion. Neck supple. No JVD present. No tracheal deviation  present. No thyromegaly present.  Cardiovascular: Normal rate, regular rhythm, normal heart sounds and intact distal pulses.  Exam reveals no gallop and no friction rub.   No murmur heard. Pulmonary/Chest: Effort normal and breath sounds normal. No stridor. No respiratory distress. She has no wheezes. She has no rales. She exhibits no tenderness.  Abdominal: Soft. Bowel sounds are normal. She exhibits no distension and no mass. There is no tenderness. There is no rebound and no guarding.  Musculoskeletal: Normal range of motion. She exhibits no edema, tenderness or deformity.  Lymphadenopathy:    She has no cervical adenopathy.  Neurological: She is oriented to person, place, and time.  Skin: Skin is warm and dry. No rash noted. She is not diaphoretic. No erythema. No pallor.  Psychiatric: She has a normal mood and affect. Her behavior is normal. Judgment and thought content normal.  Vitals reviewed.   Lab Results  Component Value Date   WBC 11.0 (H) 05/17/2016   HGB 11.8 (L) 05/17/2016   HCT 36.2 05/17/2016   PLT 517.0 (H) 05/17/2016   GLUCOSE 90 05/17/2016   CHOL 167 05/17/2016   TRIG 84.0 05/17/2016   HDL 50.20 05/17/2016   LDLCALC 100 (H) 05/17/2016   ALT 17 05/17/2016   AST 18 05/17/2016   NA 139 05/17/2016   K 3.9 05/17/2016   CL 106 05/17/2016   CREATININE 0.66 05/17/2016   BUN 9 05/17/2016   CO2 27 05/17/2016   TSH 1.10 05/17/2016   INR 1.20 03/07/2016    Assessment & Plan:   Litsa was seen today for annual exam and anemia.  Diagnoses and all orders for this visit:  Need for prophylactic vaccination with combined diphtheria-tetanus-pertussis (DTP) vaccine -     Tdap vaccine greater than or equal to 7yo IM  Hyperparathyroidism ([place])- her calcium level is normal, her vitamin D level is stable, her PTH has risen slightly. She is asymptomatic with respect to this so no treatment is needed at this time. She will continue to take her vitamin D supplement and will  follow-up with endocrinology as previously directed. -     PTH, intact and calcium; Future  Bariatric surgery status- I have ordered labs to screen for vitamin deficiencies. -     IBC panel; Future -     Vitamin B12; Future -     Ferritin; Future -     Folate; Future -     Vitamin B1; Future -     Vitamin B6; Future -     VITAMIN D 25 Hydroxy (Vit-D Deficiency, Fractures); Future  Hypocalcemia  Vitamin D deficiency- some improvement noted, I've asked her to continue high-dose vitamin D supplementation. -     VITAMIN D 25 Hydroxy (Vit-D Deficiency, Fractures); Future  Deficiency anemia- I will screen her for vitamin deficiencies, for now I've asked her to start high-dose oral B12 supplementation. -     IBC panel; Future -     Vitamin B12; Future -     Ferritin; Future -     Folate; Future -     Vitamin B1; Future -     Vitamin B6; Future -     cyanocobalamin 2000 MCG tablet; Take 1 tablet (2,000 mcg total) by mouth daily.  Routine  general medical examination at a health care facility- exam completed, labs ordered and reviewed, her vaccines were reviewed and she was given a TD And an influenza vaccine, Ollen Barges and Pap smear up-to-date, patient education material was given. -     Lipid panel; Future -     Comprehensive metabolic panel; Future -     CBC with Differential/Platelet; Future -     TSH; Future  Benign paroxysmal positional vertigo, bilateral -     meclizine (ANTIVERT) 25 MG tablet; Take 1 tablet (25 mg total) by mouth 3 (three) times daily as needed for dizziness. Taken prn -     diazepam (VALIUM) 5 MG tablet; Take 1 tablet (5 mg total) by mouth every 6 (six) hours as needed for anxiety. 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  Iron deficiency anemia- will start iron replacement therapy -     Fe Cbn-Fe  Gluc-FA-B12-C-DSS (FERRALET 90) 90-1 MG TABS; Take 1 tablet by mouth daily.  B12 deficiency anemia- will start B12 replacement therapy. -     Fe Cbn-Fe Gluc-FA-B12-C-DSS (FERRALET 90) 90-1 MG TABS; Take 1 tablet by mouth daily. -     cyanocobalamin 2000 MCG tablet; Take 1 tablet (2,000 mcg total) by mouth daily.   I have discontinued Ms. Eckard's IRON PO. I have also changed her meclizine and diazepam. Additionally, I am having her start on FERRALET 90 and cyanocobalamin. Lastly, I am having her maintain her multivitamin and Vitamin D (Ergocalciferol).  Meds ordered this encounter  Medications  . DISCONTD: diazepam (VALIUM) 5 MG tablet    Sig: Take 5 mg by mouth every 6 (six) hours as needed for anxiety. Pt takes prn  . DISCONTD: meclizine (ANTIVERT) 25 MG tablet    Sig: Take 25 mg by mouth 3 (three) times daily as needed for dizziness. Taken prn  . meclizine (ANTIVERT) 25 MG tablet    Sig: Take 1 tablet (25 mg total) by mouth 3 (three) times daily as needed for dizziness. Taken prn    Dispense:  30 tablet    Refill:  2  . diazepam (VALIUM) 5 MG tablet    Sig: Take 1 tablet (5 mg total) by mouth every 6 (six) hours as needed for anxiety. Pt takes prn for vertigo    Dispense:  30 tablet    Refill:  2  . Fe Cbn-Fe Gluc-FA-B12-C-DSS (FERRALET 90) 90-1 MG TABS    Sig: Take 1 tablet by mouth daily.    Dispense:  30 each    Refill:  11  . cyanocobalamin 2000 MCG tablet    Sig: Take 1 tablet (2,000 mcg total) by mouth daily.    Dispense:  90 tablet    Refill:  3     Follow-up: Return in about 3 months (around 08/16/2016).  Scarlette Calico, MD

## 2016-05-17 NOTE — Progress Notes (Signed)
Pre visit review using our clinic review tool, if applicable. No additional management support is needed unless otherwise documented below in the visit note. 

## 2016-05-18 ENCOUNTER — Encounter: Payer: Self-pay | Admitting: Internal Medicine

## 2016-05-18 DIAGNOSIS — D649 Anemia, unspecified: Secondary | ICD-10-CM | POA: Insufficient documentation

## 2016-05-18 LAB — PTH, INTACT AND CALCIUM
CALCIUM: 8.8 mg/dL (ref 8.6–10.2)
PTH: 168 pg/mL — AB (ref 14–64)

## 2016-05-18 MED ORDER — FERRALET 90 90-1 MG PO TABS: 1.0000 | ORAL_TABLET | Freq: Every day | ORAL | 11 refills | Status: DC

## 2016-05-18 MED ORDER — CYANOCOBALAMIN 2000 MCG PO TABS
2000.0000 ug | ORAL_TABLET | Freq: Every day | ORAL | 3 refills | Status: DC
Start: 2016-05-18 — End: 2016-10-01

## 2016-05-19 ENCOUNTER — Encounter: Payer: Self-pay | Admitting: Internal Medicine

## 2016-05-21 ENCOUNTER — Other Ambulatory Visit: Payer: Self-pay | Admitting: Internal Medicine

## 2016-05-21 ENCOUNTER — Encounter: Payer: Self-pay | Admitting: Internal Medicine

## 2016-05-21 DIAGNOSIS — E519 Thiamine deficiency, unspecified: Secondary | ICD-10-CM | POA: Insufficient documentation

## 2016-05-21 LAB — VITAMIN B6: VITAMIN B6: 9 ng/mL (ref 2.1–21.7)

## 2016-05-21 LAB — VITAMIN B1: VITAMIN B1 (THIAMINE): 7 nmol/L — AB (ref 8–30)

## 2016-05-21 MED ORDER — VITAMIN B-1 100 MG PO TABS: 100.0000 mg | ORAL_TABLET | Freq: Every day | ORAL | 3 refills | Status: DC

## 2016-05-28 MED FILL — FERRALET 90 DUAL-IRON TAB: 90-1 | 30 days supply | Qty: 30 | Fill #0

## 2016-08-12 ENCOUNTER — Telehealth: Payer: Self-pay | Admitting: Internal Medicine

## 2016-08-12 NOTE — Telephone Encounter (Signed)
Left patient vm to call back to reschedule appt cancelled through the automated system.

## 2016-08-16 ENCOUNTER — Ambulatory Visit: Payer: Managed Care, Other (non HMO) | Admitting: Internal Medicine

## 2016-08-24 MED FILL — diazePAM 5 MG TABS: 5 | 7 days supply | Qty: 30 | Fill #1

## 2016-08-31 ENCOUNTER — Encounter: Payer: Self-pay | Admitting: Internal Medicine

## 2016-09-03 ENCOUNTER — Other Ambulatory Visit: Payer: Self-pay | Admitting: Obstetrics and Gynecology

## 2016-09-03 DIAGNOSIS — R928 Other abnormal and inconclusive findings on diagnostic imaging of breast: Secondary | ICD-10-CM

## 2016-09-09 ENCOUNTER — Ambulatory Visit: Payer: Self-pay | Admitting: Internal Medicine

## 2016-09-10 ENCOUNTER — Ambulatory Visit
Admission: RE | Admit: 2016-09-10 | Discharge: 2016-09-10 | Disposition: A | Discharge status: Home / Self Care | Payer: Managed Care, Other (non HMO) | Source: Ambulatory Visit | Attending: Obstetrics and Gynecology | Admitting: Obstetrics and Gynecology

## 2016-09-10 DIAGNOSIS — R928 Other abnormal and inconclusive findings on diagnostic imaging of breast: Secondary | ICD-10-CM

## 2016-09-13 ENCOUNTER — Encounter: Payer: Self-pay | Admitting: Internal Medicine

## 2016-09-13 ENCOUNTER — Other Ambulatory Visit (INDEPENDENT_AMBULATORY_CARE_PROVIDER_SITE_OTHER): Payer: Managed Care, Other (non HMO)

## 2016-09-13 ENCOUNTER — Ambulatory Visit (INDEPENDENT_AMBULATORY_CARE_PROVIDER_SITE_OTHER): Payer: Managed Care, Other (non HMO) | Admitting: Internal Medicine

## 2016-09-13 VITALS — BP 122/80 | HR 82 | Temp 98.3°F | Resp 16 | Ht 71.5 in | Wt 191.0 lb

## 2016-09-13 DIAGNOSIS — D508 Other iron deficiency anemias: Secondary | ICD-10-CM

## 2016-09-13 DIAGNOSIS — D518 Other vitamin B12 deficiency anemias: Secondary | ICD-10-CM | POA: Diagnosis not present

## 2016-09-13 DIAGNOSIS — D72829 Elevated white blood cell count, unspecified: Secondary | ICD-10-CM

## 2016-09-13 LAB — CBC WITH DIFFERENTIAL/PLATELET
Basophils Absolute: 0.1 10*3/uL (ref 0.0–0.1)
Basophils Relative: 0.9 % (ref 0.0–3.0)
EOS PCT: 1.9 % (ref 0.0–5.0)
Eosinophils Absolute: 0.2 10*3/uL (ref 0.0–0.7)
HEMATOCRIT: 35.8 % — AB (ref 36.0–46.0)
HEMOGLOBIN: 11.8 g/dL — AB (ref 12.0–15.0)
LYMPHS PCT: 30.1 % (ref 12.0–46.0)
Lymphs Abs: 3.1 10*3/uL (ref 0.7–4.0)
MCHC: 33 g/dL (ref 30.0–36.0)
MCV: 83.3 fl (ref 78.0–100.0)
MONOS PCT: 12.3 % — AB (ref 3.0–12.0)
Monocytes Absolute: 1.3 10*3/uL — ABNORMAL HIGH (ref 0.1–1.0)
Neutro Abs: 5.7 10*3/uL (ref 1.4–7.7)
Neutrophils Relative %: 54.8 % (ref 43.0–77.0)
Platelets: 463 10*3/uL — ABNORMAL HIGH (ref 150.0–400.0)
RBC: 4.3 Mil/uL (ref 3.87–5.11)
RDW: 15.3 % (ref 11.5–15.5)
WBC: 10.4 10*3/uL (ref 4.0–10.5)

## 2016-09-13 LAB — IBC PANEL
Iron: 349 ug/dL — ABNORMAL HIGH (ref 42–145)
Saturation Ratios: 70.2 % — ABNORMAL HIGH (ref 20.0–50.0)
Transferrin: 355 mg/dL (ref 212.0–360.0)

## 2016-09-13 LAB — COMPREHENSIVE METABOLIC PANEL
ALK PHOS: 87 U/L (ref 39–117)
ALT: 15 U/L (ref 0–35)
AST: 18 U/L (ref 0–37)
Albumin: 3.7 g/dL (ref 3.5–5.2)
BUN: 11 mg/dL (ref 6–23)
CALCIUM: 8.6 mg/dL (ref 8.4–10.5)
CO2: 27 mEq/L (ref 19–32)
Chloride: 109 mEq/L (ref 96–112)
Creatinine, Ser: 0.77 mg/dL (ref 0.40–1.20)
GFR: 84.48 mL/min (ref >60.00)
GLUCOSE: 75 mg/dL (ref 70–99)
POTASSIUM: 4.4 meq/L (ref 3.5–5.1)
Sodium: 141 mEq/L (ref 135–145)
TOTAL PROTEIN: 6.9 g/dL (ref 6.0–8.3)
Total Bilirubin: 0.3 mg/dL (ref 0.2–1.2)

## 2016-09-13 LAB — FERRITIN: FERRITIN: 4.7 ng/mL — AB (ref 10.0–291.0)

## 2016-09-13 NOTE — Patient Instructions (Signed)
Iron Deficiency Anemia, Adult Iron deficiency anemia is a condition in which the concentration of red blood cells or hemoglobin in the blood is below normal because of too little iron. Hemoglobin is a substance in red blood cells that carries oxygen to the body's tissues. When the concentration of red blood cells or hemoglobin is too low, not enough oxygen reaches these tissues. Iron deficiency anemia is usually long-lasting (chronic) and it develops over time. It may or may not cause symptoms. It is a common type of anemia. What are the causes? This condition may be caused by:  Not enough iron in the diet.  Blood loss caused by bleeding in the intestine.  Blood loss from a gastrointestinal condition like Crohn disease.  Frequent blood draws, such as from blood donation.  Abnormal absorption in the gut.  Heavy menstrual periods in women.  Cancers of the gastrointestinal system, such as colon cancer. What are the signs or symptoms? Symptoms of this condition may include:  Fatigue.  Headache.  Pale skin, lips, and nail beds.  Poor appetite.  Weakness.  Shortness of breath.  Dizziness.  Cold hands and feet.  Fast or irregular heartbeat.  Irritability. This is more common in severe anemia.  Rapid breathing. This is more common in severe anemia. Mild anemia may not cause any symptoms. How is this diagnosed? This condition is diagnosed based on:  Your medical history.  A physical exam.  Blood tests. You may have additional tests to find the underlying cause of your anemia, such as:  Testing for blood in the stool (fecal occult blood test).  A procedure to see inside your colon and rectum (colonoscopy).  A procedure to see inside your esophagus and stomach (endoscopy).  A test in which cells are removed from bone marrow (bone marrow aspiration) or fluid is removed from the bone marrow to be examined (biopsy). This is rarely needed. How is this treated? This  condition is treated by correcting the cause of your iron deficiency. Treatment may involve:  Adding iron-rich foods to your diet.  Taking iron supplements. If you are pregnant or breastfeeding, you may need to take extra iron because your normal diet usually does not provide the amount of iron that you need.  Increasing vitamin C intake. Vitamin C helps your body absorb iron. Your health care provider may recommend that you take iron supplements along with a glass of orange juice or a vitamin C supplement.  Medicines to make heavy menstrual flow lighter.  Surgery. You may need repeat blood tests to determine whether treatment is working. Depending on the underlying cause, the anemia should be corrected within 2 months of starting treatment. If the treatment does not seem to be working, you may need more testing. Follow these instructions at home: Medicines  Take over-the-counter and prescription medicines only as told by your health care provider. This includes iron supplements and vitamins.  If you cannot tolerate taking iron supplements by mouth, talk with your health care provider about taking them through a vein (intravenously) or an injection into a muscle.  For the best iron absorption, you should take iron supplements when your stomach is empty. If you cannot tolerate them on an empty stomach, you may need to take them with food.  Do not drink milk or take antacids at the same time as your iron supplements. Milk and antacids may interfere with iron absorption.  Iron supplements can cause constipation. To prevent constipation, include fiber in your diet as told   by your health care provider. A stool softener may also be recommended. Eating and drinking  Talk with your health care provider before changing your diet. He or she may recommend that you eat foods that contain a lot of iron, such as:  Liver.  Low-fat (lean) beef.  Breads and cereals that have iron added to them (are  fortified).  Eggs.  Dried fruit.  Dark green, leafy vegetables.  To help your body use the iron from iron-rich foods, eat those foods at the same time as fresh fruits and vegetables that are high in vitamin C. Foods that are high in vitamin C include:  Oranges.  Peppers.  Tomatoes.  Mangoes.  Drinkenoughfluid to keep your urine clear or pale yellow. General instructions  Return to your normal activities as told by your health care provider. Ask your health care provider what activities are safe for you.  Practice good hygiene. Anemia can make you more prone to illness and infection.  Keep all follow-up visits as told by your health care provider. This is important. Contact a health care provider if:  You feel nauseous or you vomit.  You feel weak.  You have unexplained sweating.  You develop symptoms of constipation, such as:  Having fewer than three bowel movements a week.  Straining to have a bowel movement.  Having stools that are hard, dry, or larger than normal.  Feeling full or bloated.  Pain in the lower abdomen.  Not feeling relief after having a bowel movement. Get help right away if:  You faint. If this happens, do not drive yourself to the hospital. Call your local emergency services (911 in the [place]  You have chest pain.  You have shortness of breath that:  Is severe.  Gets worse with physical activity.  You have a rapid heartbeat.  You become light-headed when getting up from a sitting or lying down position. This information is not intended to replace advice given to you by your health care provider. Make sure you discuss any questions you have with your health care provider. Document Released: 08/20/2000 Document Revised: 05/12/2016 Document Reviewed: 05/12/2016 Elsevier Interactive Patient Education  2017 [employer]  

## 2016-09-13 NOTE — Progress Notes (Signed)
Pre visit review using our clinic review tool, if applicable. No additional management support is needed unless otherwise documented below in the visit note. 

## 2016-09-13 NOTE — Progress Notes (Signed)
Subjective:  Patient ID: Sydney [name], female    DOB: Apr 05, 1966  Age: 51 y.o. MRN: [id number]  CC: Anemia   HPI [name] presents for follow-up on anemia. She tells me her fatigue is much better. She denies shortness of breath, abdominal pain, or paresthesias. She has rare episodes of diarrhea but denies constipation, melena, or blood in her stools.   Outpatient Medications Prior to Visit  Medication Sig Dispense Refill  . cyanocobalamin 2000 MCG tablet Take 1 tablet (2,000 mcg total) by mouth daily. 90 tablet 3  . diazepam (VALIUM) 5 MG tablet Take 1 tablet (5 mg total) by mouth every 6 (six) hours as needed for anxiety. Pt takes prn for vertigo 30 tablet 2  . Fe Cbn-Fe Gluc-FA-B12-C-DSS (FERRALET 90) 90-1 MG TABS Take 1 tablet by mouth daily. 30 each 11  . meclizine (ANTIVERT) 25 MG tablet Take 1 tablet (25 mg total) by mouth 3 (three) times daily as needed for dizziness. Taken prn 30 tablet 2  . Multiple Vitamin (MULTIVITAMIN) tablet Take 1 tablet by mouth 3 (three) times daily.    [name] thiamine (VITAMIN B-1) 100 MG tablet Take 1 tablet (100 mg total) by mouth daily. 90 tablet 3  . Vitamin D, Ergocalciferol, (DRISDOL) 50000 units CAPS capsule Take 1 capsule (50,000 Units total) by mouth 3 (three) times a week. 24 capsule 1   No facility-administered medications prior to visit.     ROS Review of Systems  Constitutional: Positive for fatigue. Negative for appetite change, chills, diaphoresis and unexpected weight change.  HENT: Negative.  Negative for trouble swallowing.   Eyes: Negative for visual disturbance.  Respiratory: Negative for cough, chest tightness, shortness of breath and wheezing.   Cardiovascular: Negative.  Negative for chest pain, palpitations and leg swelling.  Gastrointestinal: Positive for diarrhea. Negative for abdominal pain, constipation, nausea and vomiting.  Endocrine: Negative.   Genitourinary: Negative.  Negative for difficulty urinating.    Musculoskeletal: Negative.  Negative for back pain and neck pain.  Skin: Negative.  Negative for color change and rash.  Allergic/Immunologic: Negative.   Neurological: Negative.  Negative for dizziness, weakness, light-headedness and numbness.  Hematological: Negative.  Negative for adenopathy. Does not bruise/bleed easily.  Psychiatric/Behavioral: Negative.     Objective:  BP 122/80 (BP Location: Left Arm, Patient Position: Sitting, Cuff Size: Normal)   Pulse 82   Temp 98.3 F (36.8 C) (Oral)   Resp 16   Ht 5' 11.5" (1.816 m)   Wt 191 lb (86.6 kg)   SpO2 97%   BMI 26.27 kg/m   BP Readings from Last 3 Encounters:  09/13/16 122/80  05/17/16 130/88  03/09/16 107/60    Wt Readings from Last 3 Encounters:  09/13/16 191 lb (86.6 kg)  05/17/16 189 lb (85.7 kg)  03/07/16 194 lb (88 kg)    Physical Exam  Constitutional: She is oriented to person, place, and time. She appears well-developed and well-nourished. No distress.  HENT:  Mouth/Throat: Oropharynx is clear and moist. No oropharyngeal exudate.  Eyes: Right eye exhibits no discharge. Left eye exhibits no discharge. No scleral icterus.  Neck: Normal range of motion. Neck supple. No JVD present. No tracheal deviation present. No thyromegaly present.  Cardiovascular: Normal rate, regular rhythm, normal heart sounds and intact distal pulses.  Exam reveals no gallop and no friction rub.   No murmur heard. Pulmonary/Chest: Effort normal and breath sounds normal. No respiratory distress. She has no wheezes. She has no rales. She exhibits  no tenderness.  Abdominal: Soft. Bowel sounds are normal. She exhibits no distension and no mass. There is no tenderness. There is no rebound and no guarding.  Musculoskeletal: Normal range of motion. She exhibits no edema, tenderness or deformity.  Lymphadenopathy:    She has no cervical adenopathy.  Neurological: She is oriented to person, place, and time.  Skin: Skin is warm and dry. No rash  noted. She is not diaphoretic. No erythema. No pallor.  Vitals reviewed.   Lab Results  Component Value Date   WBC 10.4 09/13/2016   HGB 11.8 (L) 09/13/2016   HCT 35.8 (L) 09/13/2016   PLT 463.0 (H) 09/13/2016   GLUCOSE 75 09/13/2016   CHOL 167 05/17/2016   TRIG 84.0 05/17/2016   HDL 50.20 05/17/2016   LDLCALC 100 (H) 05/17/2016   ALT 15 09/13/2016   AST 18 09/13/2016   NA 141 09/13/2016   K 4.4 09/13/2016   CL 109 09/13/2016   CREATININE 0.77 09/13/2016   BUN 11 09/13/2016   CO2 27 09/13/2016   TSH 1.10 05/17/2016   INR 1.20 03/07/2016    Mm Diag Breast Tomo Uni Left  Result Date: 09/10/2016 CLINICAL DATA:  Asymmetry left breast identified on the MLO view of the recent screening mammogram. EXAM: 2D DIGITAL DIAGNOSTIC UNILATERAL LEFT MAMMOGRAM WITH CAD AND ADJUNCT TOMO COMPARISON:  08/31/2016 and earlier priors ACR Breast Density Category b: There are scattered areas of fibroglandular density. FINDINGS: Focal spot compression views of the deep upper left breast in the MLO projection and whole breast 90 degree views of the left breast including tomography show no mass or distortion. Normal fibroglandular tissue disperses in the region of the questioned asymmetry on the recent screening mammogram. Mammographic images were processed with CAD. IMPRESSION: No evidence of malignancy in the left breast. RECOMMENDATION: Screening mammogram in one year.(Code:SM-B-01Y) I have discussed the findings and recommendations with the patient. Results were also provided in writing at the conclusion of the visit. If applicable, a reminder letter will be sent to the patient regarding the next appointment. BI-RADS CATEGORY  1: Negative. Electronically Signed   By: Curlene Dolphin M.D.   On: 09/10/2016 10:36    Assessment & Plan:   [name] was seen today for anemia.  Diagnoses and all orders for this visit:  Other vitamin B12 deficiency anemia- her anemia is essentially unchanged after several months of  B12 supplementation. Will continue B12 supplementation and referred to hematology for further evaluation. -     CBC with Differential/Platelet; Future  Hypocalcemia- she is maintaining a normal calcium level now. -     Comprehensive metabolic panel; Future  Leukocytosis, unspecified type- her white cell count is in the high normal range but she continues to have a mild anemia and elevated platelet count. I have asked her to see hematology for further evaluation. -     CBC with Differential/Platelet; Future -     Ambulatory referral to Hematology  Other iron deficiency anemia- her iron level is now high but her ferritin level continues to be low and her anemia has not improved. I have asked her to see hematology for further evaluation of this. -     CBC with Differential/Platelet; Future -     IBC panel; Future -     Ferritin; Future -     Ambulatory referral to Hematology   I am having Ms. Swilling maintain her multivitamin, Vitamin D (Ergocalciferol), meclizine, diazepam, FERRALET 90, cyanocobalamin, and thiamine.  No orders of  the defined types were placed in this encounter.    Follow-up: Return in about 3 months (around 12/12/2016).  Scarlette Calico, MD

## 2016-09-17 ENCOUNTER — Ambulatory Visit (HOSPITAL_BASED_OUTPATIENT_CLINIC_OR_DEPARTMENT_OTHER): Payer: Managed Care, Other (non HMO) | Admitting: Oncology

## 2016-09-17 ENCOUNTER — Ambulatory Visit (HOSPITAL_BASED_OUTPATIENT_CLINIC_OR_DEPARTMENT_OTHER): Payer: Managed Care, Other (non HMO)

## 2016-09-17 ENCOUNTER — Telehealth: Payer: Self-pay | Admitting: Oncology

## 2016-09-17 DIAGNOSIS — D539 Nutritional anemia, unspecified: Secondary | ICD-10-CM

## 2016-09-17 DIAGNOSIS — D649 Anemia, unspecified: Secondary | ICD-10-CM

## 2016-09-17 LAB — LACTATE DEHYDROGENASE: LDH: 135 U/L (ref 125–245)

## 2016-09-17 LAB — IRON AND TIBC
%SAT: 3 % — AB (ref 21–57)
IRON: 15 ug/dL — AB (ref 41–142)
TIBC: 456 ug/dL — AB (ref 236–444)
UIBC: 441 ug/dL — AB (ref 120–384)

## 2016-09-17 NOTE — Progress Notes (Signed)
[facility] Initial Visit:  Patient Care Team: Janith Lima, MD as PCP - General (Internal Medicine)  CHIEF COMPLAINTS/PURPOSE OF CONSULTATION:  Leukocytosis Anemia  HISTORY OF PRESENTING ILLNESS: Sydney [name] 51 y.o. female is here because of presents for evaluation of mild leukocytosis and normocytic anemia. Patient's most recent CBC from 09/13/16 demonstrated WBC 10.4 K, hemoglobin 11.8 g/dL, hematocrit 35.8%, MCV 83.3, platelet count 463K. Serum iron 349, ferritin 4.7, transferrin 355. Most recent vitamin B12 level was 329 on 05/17/16. For the past year patient's hemoglobin has been stable in the 11 g/dL range. She had a mild leukocytosis of 11K on 05/17/16, that has resolved at this time. She has a history of mini gastric bypass in 2009, and states that her anemia issue is has started after she had her surgery. Patient is currently on oral vitamin B12 and iron supplementation. She denies any evidence of bleeding including hematochezia, hematuria, melena, hemoptysis. Overall the only symptom that she has some her anemia is fatigue. Review of Systems  Constitutional: Positive for fatigue. Negative for appetite change, chills and fever.  HENT:   Negative for hearing loss, lump/mass, mouth sores, sore throat and tinnitus.   Eyes: Negative for eye problems and icterus.  Respiratory: Negative for chest tightness, cough, hemoptysis, shortness of breath and wheezing.   Cardiovascular: Negative for chest pain, leg swelling and palpitations.  Gastrointestinal: Negative for abdominal distention, abdominal pain, blood in stool, diarrhea, nausea and vomiting.  Endocrine: Negative.  Negative for hot flashes.  Genitourinary: Negative for difficulty urinating, frequency and hematuria.   Musculoskeletal: Negative for arthralgias and neck pain.  Skin: Negative for itching and rash.  Neurological: Negative for dizziness, headaches and speech difficulty.  Hematological: Negative for  adenopathy. Does not bruise/bleed easily.  Psychiatric/Behavioral: Negative for confusion. The patient is not nervous/anxious.     MEDICAL HISTORY: Past Medical History:  Diagnosis Date  . Abdominal pain, RLQ   . Right ovarian cyst     SURGICAL HISTORY: Past Surgical History:  Procedure Laterality Date  . CHOLECYSTECTOMY  1990  . CYSTOSCOPY  02/17/2012   Procedure: CYSTOSCOPY;  Surgeon: Darlyn Chamber, MD;  Location: [facility];  Service: Gynecology;;  . DIAGNOSTIC LAPAROSCOPY  1998   ENDOMETRIOSIS  . GANGLION CYST REMOVED  X3   LAST ONE 1989   LEFT WRIST  . LIPOMA REMOVED  2008   RIGHT PALM  . MINI GASTRIC BYPASS  2009  . NASAL SINUS SURGERY  X3    LAST ONE 1996  . ORIF RIGHT ELBOW FX  2004  . PUBOVAGINAL SLING  2001  . 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 TONSILLECTOMY  1975  . VAGINAL HYSTERECTOMY  1999    SOCIAL HISTORY: Social History   Social History  . Marital status: Married    Spouse name: N/A  . Number of children: N/A  . Years of education: N/A   Occupational History  . Not on file.   Social History Main Topics  . Smoking status: Never Smoker  . Smokeless tobacco: Never Used  . Alcohol use Yes     Comment: OCCASIONAL  . Drug use: No  . Sexual activity: Yes    Partners: Male   Other Topics Concern  . Not on file   Social History Narrative   Regular exercise: yes, walk 20 min daily   Caffeine use: 1 cup of coffee daily    FAMILY HISTORY Family History  Problem Relation Age of  Onset  . Heart failure Mother   . Heart failure Father   . Hypertension Father   . Diabetes Father   . Cancer Other     ALLERGIES:  is allergic to codeine.  MEDICATIONS:  Current Outpatient Prescriptions  Medication Sig Dispense Refill  . cyanocobalamin 2000 MCG tablet Take 1 tablet (2,000 mcg total) by mouth daily. 90 tablet 3  . diazepam (VALIUM) 5 MG tablet Take 1 tablet (5 mg total) by mouth every 6 (six) hours as needed for anxiety. Pt takes prn  for vertigo 30 tablet 2  . Fe Cbn-Fe Gluc-FA-B12-C-DSS (FERRALET 90) 90-1 MG TABS Take 1 tablet by mouth daily. 30 each 11  . meclizine (ANTIVERT) 25 MG tablet Take 1 tablet (25 mg total) by mouth 3 (three) times daily as needed for dizziness. Taken prn 30 tablet 2  . Multiple Vitamin (MULTIVITAMIN) tablet Take 1 tablet by mouth 3 (three) times daily.    [name] thiamine (VITAMIN B-1) 100 MG tablet Take 1 tablet (100 mg total) by mouth daily. 90 tablet 3  . Vitamin D, Ergocalciferol, (DRISDOL) 50000 units CAPS capsule Take 1 capsule (50,000 Units total) by mouth 3 (three) times a week. 24 capsule 1   No current facility-administered medications for this visit.     PHYSICAL EXAMINATION:     Vitals:   09/17/16 1422  BP: 102/63  Pulse: 81  Resp: 18  Temp: 98 F (36.7 C)    Filed Weights   09/17/16 1422  Weight: 206 lb 9.6 oz (93.7 kg)     Physical Exam  Constitutional: She is oriented to person, place, and time and well-developed, well-nourished, and in no distress. No distress.  HENT:  Head: Normocephalic and atraumatic.  Mouth/Throat: No oropharyngeal exudate.  Eyes: Conjunctivae are normal. Pupils are equal, round, and reactive to light. No scleral icterus.  Neck: Normal range of motion. Neck supple. No JVD present.  Cardiovascular: Normal rate, regular rhythm and normal heart sounds.  Exam reveals no gallop and no friction rub.   No murmur heard. Pulmonary/Chest: Breath sounds normal. No respiratory distress. She has no wheezes. She has no rales.  Abdominal: Soft. Bowel sounds are normal. She exhibits no distension. There is no tenderness. There is no guarding.  Musculoskeletal: She exhibits no edema or tenderness.  Lymphadenopathy:    She has no cervical adenopathy.  Neurological: She is alert and oriented to person, place, and time. No cranial nerve deficit.  Skin: Skin is warm and dry. No rash noted. No erythema. No pallor.  Psychiatric: Affect and judgment normal.      LABORATORY DATA: I have personally reviewed the data as listed:  Appointment on 09/13/2016  Component Date Value Ref Range Status  . Sodium 09/13/2016 141  135 - 145 mEq/L Final  . Potassium 09/13/2016 4.4  3.5 - 5.1 mEq/L Final  . Chloride 09/13/2016 109  96 - 112 mEq/L Final  . CO2 09/13/2016 27  19 - 32 mEq/L Final  . Glucose, Bld 09/13/2016 75  70 - 99 mg/dL Final  . BUN 09/13/2016 11  6 - 23 mg/dL Final  . Creatinine, Ser 09/13/2016 0.77  0.40 - 1.20 mg/dL Final  . Total Bilirubin 09/13/2016 0.3  0.2 - 1.2 mg/dL Final  . Alkaline Phosphatase 09/13/2016 87  39 - 117 U/L Final  . AST 09/13/2016 18  0 - 37 U/L Final  . ALT 09/13/2016 15  0 - 35 U/L Final  . Total Protein 09/13/2016 6.9  6.0 -  8.3 g/dL Final  . Albumin 09/13/2016 3.7  3.5 - 5.2 g/dL Final  . Calcium 09/13/2016 8.6  8.4 - 10.5 mg/dL Final  . GFR 09/13/2016 84.48  >60.00 mL/min Final  . WBC 09/13/2016 10.4  4.0 - 10.5 K/uL Final  . RBC 09/13/2016 4.30  3.87 - 5.11 Mil/uL Final  . Hemoglobin 09/13/2016 11.8* 12.0 - 15.0 g/dL Final  . HCT 09/13/2016 35.8* 36.0 - 46.0 % Final  . MCV 09/13/2016 83.3  78.0 - 100.0 fl Final  . MCHC 09/13/2016 33.0  30.0 - 36.0 g/dL Final  . RDW 09/13/2016 15.3  11.5 - 15.5 % Final  . Platelets 09/13/2016 463.0* 150.0 - 400.0 K/uL Final  . Neutrophils Relative % 09/13/2016 54.8  43.0 - 77.0 % Final  . Lymphocytes Relative 09/13/2016 30.1  12.0 - 46.0 % Final  . Monocytes Relative 09/13/2016 12.3* 3.0 - 12.0 % Final  . Eosinophils Relative 09/13/2016 1.9  0.0 - 5.0 % Final  . Basophils Relative 09/13/2016 0.9  0.0 - 3.0 % Final  . Neutro Abs 09/13/2016 5.7  1.4 - 7.7 K/uL Final  . Lymphs Abs 09/13/2016 3.1  0.7 - 4.0 K/uL Final  . Monocytes Absolute 09/13/2016 1.3* 0.1 - 1.0 K/uL Final  . Eosinophils Absolute 09/13/2016 0.2  0.0 - 0.7 K/uL Final  . Basophils Absolute 09/13/2016 0.1  0.0 - 0.1 K/uL Final  . Iron 09/13/2016 349* 42 - 145 ug/dL Final  . Transferrin 09/13/2016 355.0   212.0 - 360.0 mg/dL Final  . Saturation Ratios 09/13/2016 70.2* 20.0 - 50.0 % Final  . Ferritin 09/13/2016 4.7* 10.0 - 291.0 ng/mL Final    RADIOGRAPHIC STUDIES: I have personally reviewed the radiological images as listed and agree with the findings in the report  No results found.  ASSESSMENT/PLAN 51 year old female presents today for evaluation of chronic normocytic anemia. She likely has poor oral absorption of vitamin B12 and iron due to history of gastric bypass, which is likely contributing to her anemia.  Plan: We will perform a full anemia workup with labs stated below to rule out other causes of anemia.  Return to clinic to review her labs and to discuss the next plan of care.  Orders Placed This Encounter  Procedures  . Erythropoietin    Standing Status:   Future    Standing Expiration Date:   09/17/2017  . Vitamin B12    Standing Status:   Future    Standing Expiration Date:   09/17/2017  . Folate, Serum    Standing Status:   Future    Standing Expiration Date:   09/17/2017  . Ferritin    Standing Status:   Future    Standing Expiration Date:   09/17/2017  . Iron and TIBC    Standing Status:   Future    Standing Expiration Date:   09/17/2017  . Haptoglobin    Standing Status:   Future    Standing Expiration Date:   09/17/2017  . Hemoglobinopathy evaluation    Standing Status:   Future    Standing Expiration Date:   09/17/2017  . Lactate dehydrogenase (LDH)    Standing Status:   Future    Standing Expiration Date:   09/17/2017  . Methylmalonic acid, serum    Standing Status:   Future    Standing Expiration Date:   09/17/2017  . Soluble transferrin receptor    Standing Status:   Future    Standing Expiration Date:   09/17/2017  . Copper,  serum    Standing Status:   Future    Standing Expiration Date:   09/17/2017  . Direct antiglobulin test ([facility])    Standing Status:   Future    Standing Expiration Date:   09/17/2017    All questions were answered. The  patient knows to call the clinic with any problems, questions or concerns.  This note was electronically signed.    Twana First, MD  09/17/2016 2:24 PM

## 2016-09-17 NOTE — Telephone Encounter (Signed)
Appointments scheduled per 11/2 LOS. Patient given AVS report and calendars with future scheduled appointments °

## 2016-09-18 LAB — VITAMIN B12: Vitamin B12: 364 pg/mL (ref 232–1245)

## 2016-09-18 LAB — HAPTOGLOBIN: HAPTOGLOBIN: 165 mg/dL (ref 34–200)

## 2016-09-18 LAB — FOLATE: FOLATE: 9.4 ng/mL (ref >3.0)

## 2016-09-20 LAB — HEMOGLOBINOPATHY EVALUATION
HEMOGLOBIN A2 QUANTITATION: 2.3 % (ref 1.8–3.2)
HEMOGLOBIN F QUANTITATION: 0 % (ref 0.0–2.0)
HGB C: 0 %
HGB S: 0 %
HGB VARIANT: 0 %
Hgb A: 97.7 % (ref 96.4–98.8)

## 2016-09-20 LAB — FERRITIN: Ferritin: 6 ng/ml — ABNORMAL LOW (ref 9–269)

## 2016-09-20 LAB — ERYTHROPOIETIN: Erythropoietin: 11.7 m[IU]/mL (ref 2.6–18.5)

## 2016-09-20 LAB — DIRECT ANTIGLOBULIN TEST (NOT AT ARMC): COOMBS', DIRECT: NEGATIVE

## 2016-09-20 LAB — SOLUBLE TRANSFERRIN RECEPTOR: Transferrin Receptor: 20 nmol/L (ref 12.2–27.3)

## 2016-09-21 LAB — METHYLMALONIC ACID, SERUM: METHYLMALONIC ACID: 159 nmol/L (ref 0–378)

## 2016-09-21 LAB — COPPER, SERUM: Copper: 106 ug/dL (ref 72–166)

## 2016-09-27 LAB — PROTEIN ELECTROPHORESIS, SERUM, WITH REFLEX
A/G Ratio: 1 (ref 0.7–1.7)
ALPHA 1: 0.2 g/dL (ref 0.0–0.4)
ALPHA 2: 0.7 g/dL (ref 0.4–1.0)
Albumin: 3.3 g/dL (ref 2.9–4.4)
BETA: 1.1 g/dL (ref 0.7–1.3)
GAMMA GLOBULIN: 1.2 g/dL (ref 0.4–1.8)
Globulin, Total: 3.2 g/dL (ref 2.2–3.9)
IGM (IMMUNOGLOBIN M), SRM: 46 mg/dL (ref 26–217)
INTERPRETATION(SEE BELOW): 0
IgA, Qn, Serum: 182 mg/dL (ref 87–352)
IgG, Qn, Serum: 1210 mg/dL (ref 700–1600)
TOTAL PROTEIN: 6.5 g/dL (ref 6.0–8.5)

## 2016-10-01 ENCOUNTER — Encounter: Payer: Self-pay | Admitting: Hematology

## 2016-10-01 ENCOUNTER — Ambulatory Visit (HOSPITAL_BASED_OUTPATIENT_CLINIC_OR_DEPARTMENT_OTHER): Payer: Managed Care, Other (non HMO) | Admitting: Hematology

## 2016-10-01 ENCOUNTER — Telehealth: Payer: Self-pay | Admitting: Hematology

## 2016-10-01 VITALS — BP 116/69 | HR 72 | Temp 97.7°F | Resp 18 | Ht 71.5 in | Wt 209.1 lb

## 2016-10-01 DIAGNOSIS — E538 Deficiency of other specified B group vitamins: Secondary | ICD-10-CM | POA: Diagnosis not present

## 2016-10-01 DIAGNOSIS — D509 Iron deficiency anemia, unspecified: Secondary | ICD-10-CM | POA: Diagnosis not present

## 2016-10-01 DIAGNOSIS — D508 Other iron deficiency anemias: Secondary | ICD-10-CM

## 2016-10-01 DIAGNOSIS — D473 Essential (hemorrhagic) thrombocythemia: Secondary | ICD-10-CM | POA: Diagnosis not present

## 2016-10-01 DIAGNOSIS — D75839 Thrombocytosis, unspecified: Secondary | ICD-10-CM

## 2016-10-01 MED ORDER — B COMPLEX VITAMINS PO CAPS: 1.0000 | ORAL_CAPSULE | Freq: Every day | ORAL | Status: DC

## 2016-10-01 MED ORDER — POLYSACCHARIDE IRON COMPLEX 150 MG PO CAPS: 150.0000 mg | ORAL_CAPSULE | Freq: Every day | ORAL | Status: DC

## 2016-10-01 NOTE — Patient Instructions (Signed)
-  continue taking Multivitamin tab twice a day -start Vit B complex 1 tab po daily -start B12 sublingual 55mcg once daily -start Iron polysaccharide tab or liq (Novaferrum) 125 or 150mg  po once daily -you shall be setup for IV iron by schedulers today.

## 2016-10-01 NOTE — Progress Notes (Signed)
[name]    HEMATOLOGY/ONCOLOGY CONSULTATION NOTE  Date of Service: 10/01/2016  Patient Care Team: Janith Lima, MD as PCP - General (Internal Medicine)  CHIEF COMPLAINTS/PURPOSE OF CONSULTATION:  F/u for Iron deficiency - likely due to gastric bypass surgery  HISTORY OF PRESENTING ILLNESS:   Sydney [name] is seen in follow-up for her iron and other nutritional deficiencies thought to be related to her mini gastric bypass surgery in 2009. She was recently seen by Dr. Talbert Cage on 09/17/2016 with fatigue and anemia.  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  Notes pica symptoms of ice craving and increased fatigue.  Has been taking daily multivitamin 3 times a day and oral iron ( the last 9 months-still persistent iron deficiency suggesting lack of absorption) and po thiamine.   MEDICAL HISTORY:  Past Medical History:  Diagnosis Date  . Abdominal pain, RLQ   . Right ovarian cyst     SURGICAL HISTORY: Past Surgical History:  Procedure Laterality Date  . CHOLECYSTECTOMY  1990  . CYSTOSCOPY  02/17/2012   Procedure: CYSTOSCOPY;  Surgeon: Darlyn Chamber, MD;  Location: [facility];  Service: Gynecology;;  . DIAGNOSTIC LAPAROSCOPY  1998   ENDOMETRIOSIS  . GANGLION CYST REMOVED  X3   LAST ONE 1989   LEFT WRIST  . LIPOMA REMOVED  2008   RIGHT PALM  . MINI GASTRIC BYPASS  2009  . NASAL SINUS SURGERY  X3    LAST ONE 1996  . ORIF RIGHT ELBOW FX  2004  . PUBOVAGINAL SLING  2001  . 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 TONSILLECTOMY  1975  . VAGINAL HYSTERECTOMY  1999    SOCIAL HISTORY: Social History   Social History  . Marital status: Married    Spouse name: N/A  . Number of children: N/A  . Years of education: N/A   Occupational History  . Not on file.   Social History Main Topics  . Smoking status: Never Smoker  . Smokeless tobacco: Never Used  . Alcohol use Yes     Comment: OCCASIONAL  .  Drug use: No  . Sexual activity: Yes    Partners: Male   Other Topics Concern  . Not on file   Social History Narrative   Regular exercise: yes, walk 20 min daily   Caffeine use: 1 cup of coffee daily    FAMILY HISTORY: Family History  Problem Relation Age of Onset  . Heart failure Mother   . Heart failure Father   . Hypertension Father   . Diabetes Father   . Cancer Other     ALLERGIES:  is allergic to codeine.  MEDICATIONS:  Current Outpatient Prescriptions  Medication Sig Dispense Refill  . cyanocobalamin 2000 MCG tablet Take 1 tablet (2,000 mcg total) by mouth daily. 90 tablet 3  . diazepam (VALIUM) 5 MG tablet Take 1 tablet (5 mg total) by mouth every 6 (six) hours as needed for anxiety. Pt takes prn for vertigo 30 tablet 2  . Fe Cbn-Fe Gluc-FA-B12-C-DSS (FERRALET 90) 90-1 MG TABS Take 1 tablet by mouth daily. 30 each 11  . meclizine (ANTIVERT) 25 MG tablet Take 1 tablet (25 mg total) by mouth 3 (three) times daily as needed for dizziness. Taken prn 30 tablet 2  . Multiple Vitamin (MULTIVITAMIN) tablet Take 1 tablet by mouth 3 (three) times daily.    [name] thiamine (VITAMIN B-1)  100 MG tablet Take 1 tablet (100 mg total) by mouth daily. 90 tablet 3  . Vitamin D, Ergocalciferol, (DRISDOL) 50000 units CAPS capsule Take 1 capsule (50,000 Units total) by mouth 3 (three) times a week. 24 capsule 1   No current facility-administered medications for this visit.     REVIEW OF SYSTEMS:    10 Point review of Systems was done is negative except as noted above.  PHYSICAL EXAMINATION: ECOG PERFORMANCE STATUS: 1 - Symptomatic but completely ambulatory  . Vitals:   10/01/16 1004  BP: 116/69  Pulse: 72  Resp: 18  Temp: 97.7 F (36.5 C)   Filed Weights   10/01/16 1004  Weight: 209 lb 1.6 oz (94.8 kg)   .Body mass index is 28.76 kg/m.  GENERAL:alert, in no acute distress and comfortable SKIN: skin color, texture, turgor are normal, no rashes or significant lesions EYES:  normal, conjunctiva are pink and non-injected, sclera clear OROPHARYNX:no exudate, no erythema and lips, buccal mucosa, and tongue normal  NECK: supple, no JVD, thyroid normal size, non-tender, without nodularity LYMPH:  no palpable lymphadenopathy in the cervical, axillary or inguinal LUNGS: clear to auscultation with normal respiratory effort HEART: regular rate & rhythm,  no murmurs and no lower extremity edema ABDOMEN: abdomen soft, non-tender, normoactive bowel sounds , No palpable hepatomegaly Musculoskeletal: no cyanosis of digits and no clubbing  PSYCH: alert & oriented x 3 with fluent speech NEURO: no focal motor/sensory deficits  LABORATORY DATA:  I have reviewed the data as listed  . CBC Latest Ref Rng & Units 09/13/2016 05/17/2016 03/08/2016  WBC 4.0 - 10.5 K/uL 10.4 11.0(H) 10.6(H)  Hemoglobin 12.0 - 15.0 g/dL 11.8(L) 11.8(L) 11.3(L)  Hematocrit 36.0 - 46.0 % 35.8(L) 36.2 36.3  Platelets 150.0 - 400.0 K/uL 463.0(H) 517.0(H) 453(H)    . CMP Latest Ref Rng & Units 09/17/2016 09/13/2016 05/17/2016  Glucose 70 - 99 mg/dL - 75 -  BUN 6 - 23 mg/dL - 11 -  Creatinine 0.40 - 1.20 mg/dL - 0.77 -  Sodium 135 - 145 mEq/L - 141 -  Potassium 3.5 - 5.1 mEq/L - 4.4 -  Chloride 96 - 112 mEq/L - 109 -  CO2 19 - 32 mEq/L - 27 -  Calcium 8.4 - 10.5 mg/dL - 8.6 8.8  Total Protein 6.0 - 8.5 g/dL 6.5 6.9 -  Total Bilirubin 0.2 - 1.2 mg/dL - 0.3 -  Alkaline Phos 39 - 117 U/L - 87 -  AST 0 - 37 U/L - 18 -  ALT 0 - 35 U/L - 15 -   Component     Latest Ref Rng & Units 09/13/2016 09/17/2016  WBC     4.0 - 10.5 K/uL 10.4   RBC     3.87 - 5.11 Mil/uL 4.30   Hemoglobin     12.0 - 15.0 g/dL 11.8 (L)   HCT     36.0 - 46.0 % 35.8 (L)   MCV     78.0 - 100.0 fl 83.3   MCHC     30.0 - 36.0 g/dL 33.0   RDW     11.5 - 15.5 % 15.3   Platelets     150.0 - 400.0 K/uL 463.0 (H)   Neutrophils     43.0 - 77.0 % 54.8   Lymphocytes     12.0 - 46.0 % 30.1   Monocytes Relative     3.0 - 12.0 % 12.3 (H)    Eosinophil     0.0 -  5.0 % 1.9   Basophil     0.0 - 3.0 % 0.9   NEUT#     1.4 - 7.7 K/uL 5.7   Lymphocyte #     0.7 - 4.0 K/uL 3.1   Monocyte #     0.1 - 1.0 K/uL 1.3 (H)   Eosinophils Absolute     0.0 - 0.7 K/uL 0.2   Basophils Absolute     0.0 - 0.1 K/uL 0.1   Sodium     135 - 145 mEq/L 141   Potassium     3.5 - 5.1 mEq/L 4.4   Chloride     96 - 112 mEq/L 109   CO2     19 - 32 mEq/L 27   Glucose     70 - 99 mg/dL 75   BUN     6 - 23 mg/dL 11   Creatinine     0.40 - 1.20 mg/dL 0.77   Total Bilirubin     0.2 - 1.2 mg/dL 0.3   Alkaline Phosphatase     39 - 117 U/L 87   AST     0 - 37 U/L 18   ALT     0 - 35 U/L 15   Total Protein     6.0 - 8.3 g/dL 6.9   Albumin     2.9 - 4.4 g/dL 3.7 3.3  Calcium     8.4 - 10.5 mg/dL 8.6   GFR     >60.00 mL/min 84.48   Total Protein     6.0 - 8.5 g/dL  6.5  Alpha 1     0.0 - 0.4 g/dL  0.2  Alpha 2     0.4 - 1.0 g/dL  0.7  Beta     0.7 - 1.3 g/dL  1.1  Gamma Globulin     0.4 - 1.8 g/dL  1.2  M-SPIKE, %     Not Observed g/dL  Not Observed  Globulin, Total     2.2 - 3.9 g/dL  3.2  A/G Ratio     0.7 - 1.7  1.0  Please Note:       Comment  Interpretation(See Below)       .  IgG (Immunoglobin G), Serum     700 - 1600 mg/dL  1,210  IgA/Immunoglobulin A, Serum     87 - 352 mg/dL  182  IgM, Qn, Serum     26 - 217 mg/dL  46  IFE 1       Comment  HEMOGLOBIN F QUANTITATION     0.0 - 2.0 %  0.0  Hgb A     96.4 - 98.8 %  97.7  HGB S     0.0 %  0.0  HGB C     0.0 %  0.0  HEMOGLOBIN A2 QUANTITATION     1.8 - 3.2 %  2.3  HGB VARIANT     0.0 %  0.0  HGB INTERPRETATION       Comment  Iron     41 - 142 ug/dL  15 (L)  TIBC     236 - 444 ug/dL  456 (H)  UIBC     120 - 384 ug/dL  441 (H)  %SAT     21 - 57 %  3 (L)  Ferritin     9 - 269 ng/ml 4.7 (L) 6 (L)  Erythropoietin     2.6 - 18.5 mIU/mL  11.7  Vitamin B12     232 - 1,245 pg/mL  364  Folate     >3.0 ng/mL  9.4  Haptoglobin     34 - 200 mg/dL  165   LDH     125 - 245 U/L  135  Methylmalonic Acid, Serum     0 - 378 nmol/L  159  Coombs', Direct     Negative  Negative  Copper     72 - 166 ug/dL  106   RADIOGRAPHIC STUDIES: I have personally reviewed the radiological images as listed and agreed with the findings in the report. Mm Diag Breast Tomo Uni Left  Result Date: 09/10/2016 CLINICAL DATA:  Asymmetry left breast identified on the MLO view of the recent screening mammogram. EXAM: 2D DIGITAL DIAGNOSTIC UNILATERAL LEFT MAMMOGRAM WITH CAD AND ADJUNCT TOMO COMPARISON:  08/31/2016 and earlier priors ACR Breast Density Category b: There are scattered areas of fibroglandular density. FINDINGS: Focal spot compression views of the deep upper left breast in the MLO projection and whole breast 90 degree views of the left breast including tomography show no mass or distortion. Normal fibroglandular tissue disperses in the region of the questioned asymmetry on the recent screening mammogram. Mammographic images were processed with CAD. IMPRESSION: No evidence of malignancy in the left breast. RECOMMENDATION: Screening mammogram in one year.(Code:SM-B-01Y) I have discussed the findings and recommendations with the patient. Results were also provided in writing at the conclusion of the visit. If applicable, a reminder letter will be sent to the patient regarding the next appointment. BI-RADS CATEGORY  1: Negative. Electronically Signed   By: Curlene Dolphin M.D.   On: 09/10/2016 10:36    ASSESSMENT & PLAN:   51 year old female with history of gastric bypass surgery 2009 with  1) severe iron deficiency with mild anemia. Iron deficiency appears to be due to poor absorption of oral iron and has not been corrected despite 9 months of oral iron. She notes significant fatigue that is affecting her quality of life. 2) low normal B12 levels 3) previous history of B1 deficiency Plan -I discussed in detail the patient's anemia workup which was otherwise  unrevealing other than severe iron deficiency. -We discussed the pros and cons of IV iron therapy and the patient provided informed consent to proceed with IV Feraheme. -we will do IV Feraheme 510 mg every weekly 2 doses. -She will also switched to iron polysaccharide 150 mg by mouth daily from her current iron preparation. -Will change to multivitamin from 3 times daily to 2 times daily and add one daily B complex tablet/capsule. -She was also additionally recommended to take sublingual B12 500 g daily -vitamin D replacement as per primary care physician] -We shall recheck her iron profile, B12, B1 levels in 3 months -consider fecal occult blood testing with primary care physician to determine need for GI workup. 4) thrombocytosis - stable but persistent likely related to iron deficiency. We'll monitor for resolution after correction of iron deficiency.  Return to care with Dr. Irene Limbo in 3 months with repeat labs  All of the patients questions were answered with apparent satisfaction. The patient knows to call the clinic with any problems, questions or concerns.  I spent 20 minutes counseling the patient face to face. The total time spent in the appointment was 25 minutes and more than 50% was on counseling and direct patient cares.    Sullivan Lone MD [place] AAHIVMS [facility] Hematology/Oncology Physician [place]  (Office):  [phone] (Work cell):  [phone] (Fax):           [phone]  10/01/2016 10:45 AM

## 2016-10-01 NOTE — Telephone Encounter (Signed)
Appointments scheduled per 12/6 LOS. Patient given AVS report and calendars with future scheduled appointments. °

## 2016-10-01 NOTE — Progress Notes (Signed)
Checked in patient to assist registration delays per Darlena. Patient verified info and signed AOB. Pager given. RN notified.

## 2016-10-08 ENCOUNTER — Ambulatory Visit (INDEPENDENT_AMBULATORY_CARE_PROVIDER_SITE_OTHER): Payer: Managed Care, Other (non HMO) | Admitting: Internal Medicine

## 2016-10-08 ENCOUNTER — Encounter: Payer: Self-pay | Admitting: Internal Medicine

## 2016-10-08 VITALS — BP 120/72 | HR 69 | Wt 209.0 lb

## 2016-10-08 DIAGNOSIS — E213 Hyperparathyroidism, unspecified: Secondary | ICD-10-CM

## 2016-10-08 DIAGNOSIS — E559 Vitamin D deficiency, unspecified: Secondary | ICD-10-CM

## 2016-10-08 MED ORDER — VITAMIN D (ERGOCALCIFEROL) 1.25 MG (50000 UNIT) PO CAPS: 50000.0000 [IU] | ORAL_CAPSULE | Freq: Every day | ORAL | 3 refills | Status: DC

## 2016-10-08 MED ORDER — VITAMIN D (ERGOCALCIFEROL) 1.25 MG (50000 UNIT) PO CAPS: 50000.0000 [IU] | ORAL_CAPSULE | ORAL | 3 refills | Status: DC

## 2016-10-08 NOTE — Progress Notes (Signed)
Patient ID: Sydney [name], female   DOB: 05/17/1966, 51 y.o.   MRN: [id number]   HPI  Sydney [name] is a 51 y.o.-year-old female, initially referred by her PCP, Dr. 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 Last visit 1 year ago.  Reviewed hx - per last visit note: Pt had GBP in 01/2008 (sleeve gastrectomy) >> lost 168 lbs in 1.5 years! She only fluctuates by ~10 lbs, no major weight gain. She started on MVI before meals (3x a day) after the surgery. As a part of the post gastric bypass annual evaluation, she had a CMP, vitamin D, PTH, other vitamin and mineral levels checked on 06/18/2013. Her PTH returned high, at 138.5,while calcium returned low normal at 8.4 (8.4-10.5). Of note, the patient does not have a previous history of hypercalcemia. No fractures, no kidney stones.   Of note, her vitamin D was also low, at 24 on 06/18/2013. The patient was not taking any other vitamin D supplements other than what she got from her multivitamins (~600 units daily). She had had vitamin D deficiency in the past at the time of her lab draws, and she was usually started on vitamin D 50,000 IU x 1, then 2000 IU weekly, along with her MVI and the vit D levels would normalize, but then decrease again.    Pt does not have a FH of hypercalcemia, pituitary tumors, thyroid cancer, or osteoporosis.   I reviewed pt's pertinent labs - Labs available in Epic: Lab Results  Component Value Date   VD25OH 23.47 (L) 05/17/2016   VD25OH 22.76 (L) 01/01/2016   VD25OH 27 (L) 11/26/2013   Lab Results  Component Value Date   PTH 168 (H) 05/17/2016   PTH 201.7 (H) 11/26/2013   CALCIUM 8.6 09/13/2016   CALCIUM 8.3 (L) 05/17/2016   CALCIUM 8.8 05/17/2016   CALCIUM 9.2 03/08/2016   CALCIUM 8.2 (L) 03/07/2016   CALCIUM 8.4 (L) 01/28/2015   CALCIUM 8.3 (L) 11/26/2013   CALCIUM 9.2 12/12/2007   No h/o CKD. Last BUN/Cr: Lab Results  Component Value Date   BUN  11 09/13/2016   CREATININE 0.77 09/13/2016   She is on: - 3 MVI tab (1 tid)  - ergocalciferol 50,000 units 3x weekly  Other labs: Received labs from Dr. Arvella Nigh, from 08/29/2015: - TSH 0.751 - PTH 131, associated calcium 8.5 (8.4-10.5) - Calcium 8.4 (8.6-10.2) - Phosphorus 3.7 (2.5-4.5) - Vitamin D 18 (30-100) - Pre-albumin 15 (17-34) - Albumin 3.4 (3.6-5.1) - Magnesium 1.8 - Vitamin B12 372 (211-911) - Vitamin A 15 (38-98) - Ferritin 7 (10-291) - CMP normal, except calcium and albumin The original report will be scanned.  Received results from Dr. Radene Knee, from 08/13/2014: - Calcium 8.9 (8.4-10.5) - PTH 91 (14-64) Calcium level has normalized, PTH is lagging behind. Continue high dose of vitamin D, ergocalciferol, as prescribed by Dr. Radene Knee.  Received labs from Dr. Arvella Nigh, from 06/25/2014: - TSH 0.483 - Calcium 8.1 (8.3-10.5) - Phosphorus 3.6 (2.3-4.6) - Vitamin D 24 (30-89) - Pre-albumin .9 (17-34) - Albumin 3.6 (3.5-5.2) - Magnesium 1.8 - Vitamin B12 294 (211-911) - Vitamin A 15 (38-98) - Ferritin 8 (10-291) - Lipid panel 144/52/43/91 - CMP normal, except calcium The original report will be scanned.  06/18/2013: Calcium 8.4, PTH 138.5, phosphorus 3.9 (2.3-4.6), vit D 24  She denies perioral numbness, acral cramping. She does have dizziness but was recently diagnosed with vertigo and meclizine helps.  Now on po  iron (150 mg) b/c results show low serum iron >> but will have 2 iv infusions.  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  ROS: Constitutional: no weight gain/loss, + fatigue, no subjective hyperthermia/hypothermia Eyes: no blurry vision, no xerophthalmia ENT: no sore throat, no nodules palpated in throat, no dysphagia/odynophagia, no hoarseness Cardiovascular: no CP/SOB/palpitations/leg swelling Respiratory: no cough/SOB Gastrointestinal: no N/V/D/C Musculoskeletal: no muscle/joint aches Skin: no  rashes Neurological: no tremors/numbness/tingling/dizziness  I reviewed pt's medications, allergies, PMH, social hx, family hx, and changes were documented in the history of present illness. Otherwise, unchanged from my initial visit note. She was diagnosed with vertigo and is on meclizine prn.  PE: BP 120/72 (BP Location: Left Arm, Patient Position: Sitting)   Pulse 69   Wt 209 lb (94.8 kg)   SpO2 98%   BMI 28.74 kg/m   Wt Readings from Last 3 Encounters:  10/08/16 209 lb (94.8 kg)  10/01/16 209 lb 1.6 oz (94.8 kg)  09/17/16 206 lb 9.6 oz (93.7 kg)   Constitutional: overweight - gynoid obesity, in NAD. No kyphosis. Eyes: PERRLA, EOMI ENT: moist mucous membranes, no thyromegaly, no cervical lymphadenopathy Cardiovascular: RRR, No MRG Respiratory: CTA B Gastrointestinal: abdomen soft, NT, ND, BS+ Musculoskeletal: no deformities, strength intact in all 4 Skin: moist, warm, no rashes Neurological: no tremor with outstretched hands, DTR normal in all 4;   Assessment: 1. Hyperparathyroidism - likely 2/2 vit D deficiency  2. Vitamin D deficiency  Plan: 1. Hyperparathyroidism P patient with a history of sleeve gastrectomy in 2009, with subsequent malabsorption, whom I saw first in 2014 for secondary hyperparathyroidism + low vitamin D.Her high PTH level is usually secondary to vitamin D deficiency (causing deficit in absorbing GI calcium).  - we had to keep increasing her vitamin D supplementations, but vit D is still low on Ergocalciferol 50,000 IU 3x a week. Will need to increase this to daily and check labs again in 2-3 months. She may benefit from vit D lingual drops. - I will also check a 1,25 diHO vitamin D and may need to start Calcitriol  - reviewed latest Calcium level >> normal - I will see the patient back in one year, but will have labs few times before next appointment  Orders Placed This Encounter  Procedures  . VITAMIN D 25 Hydroxy (Vit-D Deficiency, Fractures)  .  Vitamin D 1,25 dihydroxy   Philemon Kingdom, MD PhD [facility] Endocrinology

## 2016-10-08 NOTE — Patient Instructions (Addendum)
Please increase the Ergocalciferol 50,000 units daily.   Come back for labs in 2-3 months.  Please return in 1 year.

## 2016-10-18 ENCOUNTER — Ambulatory Visit (HOSPITAL_BASED_OUTPATIENT_CLINIC_OR_DEPARTMENT_OTHER): Payer: Managed Care, Other (non HMO)

## 2016-10-18 VITALS — BP 118/66 | HR 73 | Temp 99.8°F | Resp 18

## 2016-10-18 DIAGNOSIS — D509 Iron deficiency anemia, unspecified: Secondary | ICD-10-CM | POA: Diagnosis not present

## 2016-10-18 DIAGNOSIS — D508 Other iron deficiency anemias: Secondary | ICD-10-CM

## 2016-10-18 MED ORDER — SODIUM CHLORIDE 0.9 % IV SOLN
510.0000 mg | Freq: Once | INTRAVENOUS | Status: AC
  Administered 2016-10-18: 510 mg via INTRAVENOUS
  Filled 2016-10-18: qty 17

## 2016-10-18 MED ORDER — SODIUM CHLORIDE 0.9 % IV SOLN
Freq: Once | INTRAVENOUS | Status: AC
  Administered 2016-10-18: 08:00:00 via INTRAVENOUS

## 2016-10-18 NOTE — Patient Instructions (Signed)
Ferumoxytol injection What is this medicine? FERUMOXYTOL is an iron complex. Iron is used to make healthy red blood cells, which carry oxygen and nutrients throughout the body. This medicine is used to treat iron deficiency anemia in people with chronic kidney disease. COMMON BRAND NAME(S): Feraheme What should I tell my health care provider before I take this medicine? They need to know if you have any of these conditions: -anemia not caused by low iron levels -high levels of iron in the blood -magnetic resonance imaging (MRI) test scheduled -an unusual or allergic reaction to iron, other medicines, foods, dyes, or preservatives -pregnant or trying to get pregnant -breast-feeding How should I use this medicine? This medicine is for injection into a vein. It is given by a health care professional in a hospital or clinic setting. Talk to your pediatrician regarding the use of this medicine in children. Special care may be needed. What if I miss a dose? It is important not to miss your dose. Call your doctor or health care professional if you are unable to keep an appointment. What may interact with this medicine? This medicine may interact with the following medications: -other iron products What should I watch for while using this medicine? Visit your doctor or healthcare professional regularly. Tell your doctor or healthcare professional if your symptoms do not start to get better or if they get worse. You may need blood work done while you are taking this medicine. You may need to follow a special diet. Talk to your doctor. Foods that contain iron include: whole grains/cereals, dried fruits, beans, or peas, leafy green vegetables, and organ meats (liver, kidney). What side effects may I notice from receiving this medicine? Side effects that you should report to your doctor or health care professional as soon as possible: -allergic reactions like skin rash, itching or hives, swelling of the  face, lips, or tongue -breathing problems -changes in blood pressure -feeling faint or lightheaded, falls -fever or chills -flushing, sweating, or hot feelings -swelling of the ankles or feet Side effects that usually do not require medical attention (report to your doctor or health care professional if they continue or are bothersome): -diarrhea -headache -nausea, vomiting -stomach pain Where should I keep my medicine? This drug is given in a hospital or clinic and will not be stored at home.  2017 Elsevier/Gold Standard (2015-09-25 12:41:49)  

## 2016-10-25 ENCOUNTER — Ambulatory Visit (HOSPITAL_BASED_OUTPATIENT_CLINIC_OR_DEPARTMENT_OTHER): Payer: Managed Care, Other (non HMO)

## 2016-10-25 VITALS — BP 121/71 | HR 92 | Temp 98.4°F | Resp 18

## 2016-10-25 DIAGNOSIS — D509 Iron deficiency anemia, unspecified: Secondary | ICD-10-CM

## 2016-10-25 DIAGNOSIS — D508 Other iron deficiency anemias: Secondary | ICD-10-CM

## 2016-10-25 MED ORDER — SODIUM CHLORIDE 0.9 % IV SOLN
Freq: Once | INTRAVENOUS | Status: AC
  Administered 2016-10-25: 08:00:00 via INTRAVENOUS

## 2016-10-25 MED ORDER — SODIUM CHLORIDE 0.9 % IV SOLN
510.0000 mg | Freq: Once | INTRAVENOUS | Status: AC
  Administered 2016-10-25: 510 mg via INTRAVENOUS
  Filled 2016-10-25: qty 17

## 2016-10-25 NOTE — Patient Instructions (Signed)
Ferumoxytol injection What is this medicine? FERUMOXYTOL is an iron complex. Iron is used to make healthy red blood cells, which carry oxygen and nutrients throughout the body. This medicine is used to treat iron deficiency anemia in people with chronic kidney disease. COMMON BRAND NAME(S): Feraheme What should I tell my health care provider before I take this medicine? They need to know if you have any of these conditions: -anemia not caused by low iron levels -high levels of iron in the blood -magnetic resonance imaging (MRI) test scheduled -an unusual or allergic reaction to iron, other medicines, foods, dyes, or preservatives -pregnant or trying to get pregnant -breast-feeding How should I use this medicine? This medicine is for injection into a vein. It is given by a health care professional in a hospital or clinic setting. Talk to your pediatrician regarding the use of this medicine in children. Special care may be needed. What if I miss a dose? It is important not to miss your dose. Call your doctor or health care professional if you are unable to keep an appointment. What may interact with this medicine? This medicine may interact with the following medications: -other iron products What should I watch for while using this medicine? Visit your doctor or healthcare professional regularly. Tell your doctor or healthcare professional if your symptoms do not start to get better or if they get worse. You may need blood work done while you are taking this medicine. You may need to follow a special diet. Talk to your doctor. Foods that contain iron include: whole grains/cereals, dried fruits, beans, or peas, leafy green vegetables, and organ meats (liver, kidney). What side effects may I notice from receiving this medicine? Side effects that you should report to your doctor or health care professional as soon as possible: -allergic reactions like skin rash, itching or hives, swelling of the  face, lips, or tongue -breathing problems -changes in blood pressure -feeling faint or lightheaded, falls -fever or chills -flushing, sweating, or hot feelings -swelling of the ankles or feet Side effects that usually do not require medical attention (report to your doctor or health care professional if they continue or are bothersome): -diarrhea -headache -nausea, vomiting -stomach pain Where should I keep my medicine? This drug is given in a hospital or clinic and will not be stored at home.  2017 Elsevier/Gold Standard (2015-09-25 12:41:49)  

## 2016-10-27 ENCOUNTER — Encounter: Payer: Self-pay | Admitting: Hematology

## 2016-12-31 ENCOUNTER — Encounter: Payer: Self-pay | Admitting: Hematology

## 2016-12-31 ENCOUNTER — Telehealth: Payer: Self-pay | Admitting: Hematology

## 2016-12-31 ENCOUNTER — Ambulatory Visit (HOSPITAL_BASED_OUTPATIENT_CLINIC_OR_DEPARTMENT_OTHER): Payer: Managed Care, Other (non HMO) | Admitting: Hematology

## 2016-12-31 ENCOUNTER — Other Ambulatory Visit (HOSPITAL_BASED_OUTPATIENT_CLINIC_OR_DEPARTMENT_OTHER): Payer: Managed Care, Other (non HMO)

## 2016-12-31 VITALS — BP 109/63 | HR 74 | Temp 97.8°F | Resp 18 | Ht 71.0 in | Wt 205.1 lb

## 2016-12-31 DIAGNOSIS — E538 Deficiency of other specified B group vitamins: Secondary | ICD-10-CM | POA: Diagnosis not present

## 2016-12-31 DIAGNOSIS — D473 Essential (hemorrhagic) thrombocythemia: Secondary | ICD-10-CM | POA: Diagnosis not present

## 2016-12-31 DIAGNOSIS — D509 Iron deficiency anemia, unspecified: Secondary | ICD-10-CM

## 2016-12-31 DIAGNOSIS — E539 Vitamin B deficiency, unspecified: Secondary | ICD-10-CM

## 2016-12-31 LAB — COMPREHENSIVE METABOLIC PANEL
ALT: 17 U/L (ref 0–55)
AST: 19 U/L (ref 5–34)
Albumin: 3.8 g/dL (ref 3.5–5.0)
Alkaline Phosphatase: 114 U/L (ref 40–150)
Anion Gap: 8 mEq/L (ref 3–11)
BUN: 11.8 mg/dL (ref 7.0–26.0)
CHLORIDE: 109 meq/L (ref 98–109)
CO2: 27 mEq/L (ref 22–29)
Calcium: 9.1 mg/dL (ref 8.4–10.4)
Creatinine: 0.8 mg/dL (ref 0.6–1.1)
EGFR: 82 mL/min/{1.73_m2} — ABNORMAL LOW (ref >90)
GLUCOSE: 58 mg/dL — AB (ref 70–140)
POTASSIUM: 4.5 meq/L (ref 3.5–5.1)
SODIUM: 144 meq/L (ref 136–145)
Total Bilirubin: 0.33 mg/dL (ref 0.20–1.20)
Total Protein: 7.2 g/dL (ref 6.4–8.3)

## 2016-12-31 LAB — CBC & DIFF AND RETIC
BASO%: 1.1 % (ref 0.0–2.0)
Basophils Absolute: 0.1 10*3/uL (ref 0.0–0.1)
EOS ABS: 0.4 10*3/uL (ref 0.0–0.5)
EOS%: 4.7 % (ref 0.0–7.0)
HCT: 43.1 % (ref 34.8–46.6)
HGB: 14 g/dL (ref 11.6–15.9)
Immature Retic Fract: 1.3 % — ABNORMAL LOW (ref 1.60–10.00)
LYMPH#: 3.1 10*3/uL (ref 0.9–3.3)
LYMPH%: 34 % (ref 14.0–49.7)
MCH: 29.5 pg (ref 25.1–34.0)
MCHC: 32.5 g/dL (ref 31.5–36.0)
MCV: 90.9 fL (ref 79.5–101.0)
MONO#: 1 10*3/uL — AB (ref 0.1–0.9)
MONO%: 11.2 % (ref 0.0–14.0)
NEUT%: 49 % (ref 38.4–76.8)
NEUTROS ABS: 4.4 10*3/uL (ref 1.5–6.5)
PLATELETS: 411 10*3/uL — AB (ref 145–400)
RBC: 4.74 10*6/uL (ref 3.70–5.45)
RDW: 16 % — ABNORMAL HIGH (ref 11.2–14.5)
RETIC CT ABS: 25.6 10*3/uL — AB (ref 33.70–90.70)
Retic %: 0.54 % — ABNORMAL LOW (ref 0.70–2.10)
WBC: 9 10*3/uL (ref 3.9–10.3)
nRBC: 0 % (ref 0–0)

## 2016-12-31 LAB — IRON AND TIBC
%SAT: 35 % (ref 21–57)
Iron: 107 ug/dL (ref 41–142)
TIBC: 303 ug/dL (ref 236–444)
UIBC: 195 ug/dL (ref 120–384)

## 2016-12-31 LAB — FERRITIN: FERRITIN: 68 ng/mL (ref 9–269)

## 2016-12-31 NOTE — Telephone Encounter (Signed)
Gave patient AVS and calender per 4/27 los.  

## 2017-01-01 LAB — VITAMIN B12: Vitamin B12: 445 pg/mL (ref 232–1245)

## 2017-01-02 NOTE — Progress Notes (Signed)
[name]    HEMATOLOGY/ONCOLOGY CLINIC NOTE  Date of Service: 01/02/2017  Patient Care Team: Janith Lima, MD as PCP - General (Internal Medicine)  CHIEF COMPLAINTS/PURPOSE OF CONSULTATION:  F/u for Iron deficiency - likely due to gastric bypass surgery  INTERVAL HISTORY  Sydney [name] is here for follow-up after her IV iron infusions. She notes that she tolerated these well without any acute concerns. Notes she feels much better after the IV iron. Her hemoglobin is improved from 11.8 to 14. Her platelet counts of nearly normalized at 411k and confirmed that these are probably elevated related to iron deficiency.   MEDICAL HISTORY:  Past Medical History:  Diagnosis Date  . Abdominal pain, RLQ   . Right ovarian cyst     SURGICAL HISTORY: Past Surgical History:  Procedure Laterality Date  . CHOLECYSTECTOMY  1990  . CYSTOSCOPY  02/17/2012   Procedure: CYSTOSCOPY;  Surgeon: Darlyn Chamber, MD;  Location: [facility];  Service: Gynecology;;  . DIAGNOSTIC LAPAROSCOPY  1998   ENDOMETRIOSIS  . GANGLION CYST REMOVED  X3   LAST ONE 1989   LEFT WRIST  . LIPOMA REMOVED  2008   RIGHT PALM  . MINI GASTRIC BYPASS  2009  . NASAL SINUS SURGERY  X3    LAST ONE 1996  . ORIF RIGHT ELBOW FX  2004  . PUBOVAGINAL SLING  2001  . 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 TONSILLECTOMY  1975  . VAGINAL HYSTERECTOMY  1999    SOCIAL HISTORY: Social History   Social History  . Marital status: Married    Spouse name: N/A  . Number of children: N/A  . Years of education: N/A   Occupational History  . Not on file.   Social History Main Topics  . Smoking status: Never Smoker  . Smokeless tobacco: Never Used  . Alcohol use Yes     Comment: OCCASIONAL  . Drug use: No  . Sexual activity: Yes    Partners: Male   Other Topics Concern  . Not on file   Social History Narrative   Regular exercise: yes, walk 20 min daily   Caffeine use: 1 cup of coffee daily    FAMILY  HISTORY: Family History  Problem Relation Age of Onset  . Heart failure Mother   . Heart failure Father   . Hypertension Father   . Diabetes Father   . Cancer Other     ALLERGIES:  is allergic to codeine.  MEDICATIONS:  Current Outpatient Prescriptions  Medication Sig Dispense Refill  . b complex vitamins capsule Take 1 capsule by mouth daily.    . diazepam (VALIUM) 5 MG tablet Take 1 tablet (5 mg total) by mouth every 6 (six) hours as needed for anxiety. Pt takes prn for vertigo 30 tablet 2  . iron polysaccharides (NIFEREX) 150 MG capsule Take 1 capsule (150 mg total) by mouth daily.    . meclizine (ANTIVERT) 25 MG tablet Take 1 tablet (25 mg total) by mouth 3 (three) times daily as needed for dizziness. Taken prn 30 tablet 2  . Multiple Vitamin (MULTIVITAMIN) tablet Take 1 tablet by mouth 2 (two) times daily.    . Vitamin D, Ergocalciferol, (DRISDOL) 50000 units CAPS capsule Take 1 capsule (50,000 Units total) by mouth daily. 90 capsule 3   No current facility-administered medications for this visit.     REVIEW OF SYSTEMS:    10 Point review of Systems was done is negative except as  noted above.  PHYSICAL EXAMINATION: ECOG PERFORMANCE STATUS: 1 - Symptomatic but completely ambulatory  . Vitals:   12/31/16 0955  BP: 109/63  Pulse: 74  Resp: 18  Temp: 97.8 F (36.6 C)   Filed Weights   12/31/16 0955  Weight: 205 lb 1.6 oz (93 kg)   .Body mass index is 28.61 kg/m.  GENERAL:alert, in no acute distress and comfortable SKIN: skin color, texture, turgor are normal, no rashes or significant lesions EYES: normal, conjunctiva are pink and non-injected, sclera clear OROPHARYNX:no exudate, no erythema and lips, buccal mucosa, and tongue normal  NECK: supple, no JVD, thyroid normal size, non-tender, without nodularity LYMPH:  no palpable lymphadenopathy in the cervical, axillary or inguinal LUNGS: clear to auscultation with normal respiratory effort HEART: regular rate &  rhythm,  no murmurs and no lower extremity edema ABDOMEN: abdomen soft, non-tender, normoactive bowel sounds , No palpable hepatomegaly Musculoskeletal: no cyanosis of digits and no clubbing  PSYCH: alert & oriented x 3 with fluent speech NEURO: no focal motor/sensory deficits  LABORATORY DATA:  I have reviewed the data as listed  . CBC Latest Ref Rng & Units 12/31/2016 09/13/2016 05/17/2016  WBC 3.9 - 10.3 10e3/uL 9.0 10.4 11.0(H)  Hemoglobin 11.6 - 15.9 g/dL 14.0 11.8(L) 11.8(L)  Hematocrit 34.8 - 46.6 % 43.1 35.8(L) 36.2  Platelets 145 - 400 10e3/uL 411(H) 463.0(H) 517.0(H)    . CMP Latest Ref Rng & Units 12/31/2016 09/17/2016 09/13/2016  Glucose 70 - 140 mg/dl 58(L) - 75  BUN 7.0 - 26.0 mg/dL 11.8 - 11  Creatinine 0.6 - 1.1 mg/dL 0.8 - 0.77  Sodium 136 - 145 mEq/L 144 - 141  Potassium 3.5 - 5.1 mEq/L 4.5 - 4.4  Chloride 96 - 112 mEq/L - - 109  CO2 22 - 29 mEq/L 27 - 27  Calcium 8.4 - 10.4 mg/dL 9.1 - 8.6  Total Protein 6.4 - 8.3 g/dL 7.2 6.5 6.9  Total Bilirubin 0.20 - 1.20 mg/dL 0.33 - 0.3  Alkaline Phos 40 - 150 U/L 114 - 87  AST 5 - 34 U/L 19 - 18  ALT 0 - 55 U/L 17 - 15   . Lab Results  Component Value Date   IRON 107 12/31/2016   TIBC 303 12/31/2016   IRONPCTSAT 35 12/31/2016   (Iron and TIBC)  Lab Results  Component Value Date   FERRITIN 68 12/31/2016     RADIOGRAPHIC STUDIES: I have personally reviewed the radiological images as listed and agreed with the findings in the report. No results found.  ASSESSMENT & PLAN:   51 year old female with history of gastric bypass surgery 2009 with  1) Severe iron deficiency with mild anemia. Iron deficiency appears to be due to poor absorption of oral iron and has not been corrected despite 9 months of oral iron. She notes significant fatigue that is affecting her quality of life.  Her iron deficiency is much improved after IV Feraheme which she tolerated well. Ferritin is not quite at goal of more than 100 and is  currently at 68. Hemoglobin levels have normalized with a hemoglobin of 14. 2) low normal B12 levels 3) previous history of B1 deficiency Plan -Patient notes that she is feeling much better and that her energy level and exercise tolerance have much improved. -Her ferritin level is at 68 and is not quite at more than 100 which would be her treatment goal. -We shall offer her the option of getting one more dose of IV Feraheme. If she  does this we would continue iron polysaccharide 150 mg by mouth daily for maintenance. -If she chooses not to get the additional dose of IV Feraheme we will recommend she increase her iron polysaccharide to 150 mg by mouth twice a day. -Continue multivitamin 2 times daily and one daily B complex tablet/capsule. -cont to take sublingual B12 500 g daily -vitamin D replacement as per primary care physician  4) thrombocytosis - stable but persistent likely related to iron deficiency. Improved with iron replacement. We'll monitor for resolution after correction of iron deficiency.  Return to care with Dr. Irene Limbo in 4 months with repeat labs  All of the patients questions were answered with apparent satisfaction. The patient knows to call the clinic with any problems, questions or concerns.  I spent 20 minutes counseling the patient face to face. The total time spent in the appointment was 20 minutes and more than 50% was on counseling and direct patient cares.    Sullivan Lone MD [place] AAHIVMS [facility] Hematology/Oncology Physician [facility]  (Office):       [phone] (Work cell):  [phone] (Fax):           [phone]

## 2017-01-03 ENCOUNTER — Telehealth: Payer: Self-pay | Admitting: *Deleted

## 2017-01-03 LAB — VITAMIN B1: Thiamine: 137.5 nmol/L (ref 66.5–200.0)

## 2017-01-03 NOTE — Telephone Encounter (Signed)
-----   Message from Brunetta Genera, MD sent at 01/02/2017  3:10 PM EDT ----- Darden Dates, plz let patient know her ferritin level is at 68 and is not quite at more than 100 which would be her treatment goal. -We shall offer her the option of getting one more dose of IV Feraheme (order is in plz schedule if patient agreeable). If she does this we would continue iron polysaccharide 150 mg by mouth daily for maintenance. -If she chooses not to get the additional dose of IV Feraheme we will recommend she increase her iron polysaccharide to 150 mg by mouth twice a day. thx GK

## 2017-01-03 NOTE — Telephone Encounter (Signed)
Called patient per staff message and informed her of information below. Patient agreed to increasing iron dose to 2x daily and recheck labs at office visit.

## 2017-04-26 ENCOUNTER — Ambulatory Visit (INDEPENDENT_AMBULATORY_CARE_PROVIDER_SITE_OTHER): Payer: BLUE CROSS/BLUE SHIELD | Admitting: Internal Medicine

## 2017-04-26 ENCOUNTER — Telehealth: Payer: Self-pay

## 2017-04-26 ENCOUNTER — Encounter: Payer: Self-pay | Admitting: Internal Medicine

## 2017-04-26 ENCOUNTER — Other Ambulatory Visit (INDEPENDENT_AMBULATORY_CARE_PROVIDER_SITE_OTHER): Payer: BLUE CROSS/BLUE SHIELD

## 2017-04-26 VITALS — BP 120/80 | HR 66 | Temp 98.2°F | Resp 16 | Ht 71.0 in | Wt 208.2 lb

## 2017-04-26 DIAGNOSIS — E213 Hyperparathyroidism, unspecified: Secondary | ICD-10-CM

## 2017-04-26 DIAGNOSIS — Z0000 Encounter for general adult medical examination without abnormal findings: Secondary | ICD-10-CM | POA: Diagnosis not present

## 2017-04-26 DIAGNOSIS — Z23 Encounter for immunization: Secondary | ICD-10-CM | POA: Diagnosis not present

## 2017-04-26 DIAGNOSIS — D508 Other iron deficiency anemias: Secondary | ICD-10-CM | POA: Diagnosis not present

## 2017-04-26 DIAGNOSIS — E519 Thiamine deficiency, unspecified: Secondary | ICD-10-CM

## 2017-04-26 DIAGNOSIS — E559 Vitamin D deficiency, unspecified: Secondary | ICD-10-CM

## 2017-04-26 DIAGNOSIS — Z79899 Other long term (current) drug therapy: Secondary | ICD-10-CM | POA: Diagnosis not present

## 2017-04-26 DIAGNOSIS — Z1212 Encounter for screening for malignant neoplasm of rectum: Secondary | ICD-10-CM

## 2017-04-26 DIAGNOSIS — Z1211 Encounter for screening for malignant neoplasm of colon: Secondary | ICD-10-CM

## 2017-04-26 LAB — COMPREHENSIVE METABOLIC PANEL
ALK PHOS: 98 U/L (ref 39–117)
ALT: 20 U/L (ref 0–35)
AST: 20 U/L (ref 0–37)
Albumin: 3.6 g/dL (ref 3.5–5.2)
BUN: 13 mg/dL (ref 6–23)
CHLORIDE: 106 meq/L (ref 96–112)
CO2: 30 mEq/L (ref 19–32)
Calcium: 8.1 mg/dL — ABNORMAL LOW (ref 8.4–10.5)
Creatinine, Ser: 0.71 mg/dL (ref 0.40–1.20)
GFR: 92.54 mL/min (ref >60.00)
GLUCOSE: 88 mg/dL (ref 70–99)
POTASSIUM: 4.1 meq/L (ref 3.5–5.1)
Sodium: 139 mEq/L (ref 135–145)
TOTAL PROTEIN: 7.1 g/dL (ref 6.0–8.3)
Total Bilirubin: 0.3 mg/dL (ref 0.2–1.2)

## 2017-04-26 LAB — CBC WITH DIFFERENTIAL/PLATELET
Basophils Absolute: 0.1 10*3/uL (ref 0.0–0.1)
Basophils Relative: 1.2 % (ref 0.0–3.0)
EOS PCT: 5.8 % — AB (ref 0.0–5.0)
Eosinophils Absolute: 0.6 10*3/uL (ref 0.0–0.7)
HCT: 42.8 % (ref 36.0–46.0)
HEMOGLOBIN: 13.7 g/dL (ref 12.0–15.0)
LYMPHS ABS: 3.4 10*3/uL (ref 0.7–4.0)
Lymphocytes Relative: 32.2 % (ref 12.0–46.0)
MCHC: 32 g/dL (ref 30.0–36.0)
MCV: 93.8 fl (ref 78.0–100.0)
MONOS PCT: 11.1 % (ref 3.0–12.0)
Monocytes Absolute: 1.2 10*3/uL — ABNORMAL HIGH (ref 0.1–1.0)
NEUTROS PCT: 49.7 % (ref 43.0–77.0)
Neutro Abs: 5.2 10*3/uL (ref 1.4–7.7)
Platelets: 420 10*3/uL — ABNORMAL HIGH (ref 150.0–400.0)
RBC: 4.57 Mil/uL (ref 3.87–5.11)
RDW: 13.7 % (ref 11.5–15.5)
WBC: 10.5 10*3/uL (ref 4.0–10.5)

## 2017-04-26 LAB — LIPID PANEL
Cholesterol: 159 mg/dL (ref 0–200)
HDL: 51.6 mg/dL (ref >39.00)
LDL CALC: 94 mg/dL (ref 0–99)
NONHDL: 107.89
Total CHOL/HDL Ratio: 3
Triglycerides: 67 mg/dL (ref 0.0–149.0)
VLDL: 13.4 mg/dL (ref 0.0–40.0)

## 2017-04-26 LAB — VITAMIN D 25 HYDROXY (VIT D DEFICIENCY, FRACTURES): VITD: 19.63 ng/mL — AB (ref 30.00–100.00)

## 2017-04-26 LAB — TSH: TSH: 0.71 u[IU]/mL (ref 0.35–4.50)

## 2017-04-26 MED ORDER — VITAMIN D (ERGOCALCIFEROL) 1.25 MG (50000 UNIT) PO CAPS: 50000.0000 [IU] | ORAL_CAPSULE | Freq: Every day | ORAL | 3 refills | Status: DC

## 2017-04-26 NOTE — Patient Instructions (Signed)

## 2017-04-26 NOTE — Progress Notes (Signed)
Subjective:  Patient ID: Sydney [name], female    DOB: [birth year]  Age: 51 y.o. MRN: [id number]  CC: Annual Exam   HPI [name] presents for a CPX-  She works in healthcare setting and needs documentation - UDS, MMR, varicella, Hep A and B, and TB. She feels well and offers no complaints.  Outpatient Medications Prior to Visit  Medication Sig Dispense Refill  . b complex vitamins capsule Take 1 capsule by mouth daily.    . diazepam (VALIUM) 5 MG tablet Take 1 tablet (5 mg total) by mouth every 6 (six) hours as needed for anxiety. Pt takes prn for vertigo 30 tablet 2  . iron polysaccharides (NIFEREX) 150 MG capsule Take 1 capsule (150 mg total) by mouth daily.    . meclizine (ANTIVERT) 25 MG tablet Take 1 tablet (25 mg total) by mouth 3 (three) times daily as needed for dizziness. Taken prn 30 tablet 2  . Multiple Vitamin (MULTIVITAMIN) tablet Take 1 tablet by mouth 2 (two) times daily.    . Vitamin D, Ergocalciferol, (DRISDOL) 50000 units CAPS capsule Take 1 capsule (50,000 Units total) by mouth daily. 90 capsule 3   No facility-administered medications prior to visit.     ROS Review of Systems  Constitutional: Negative for appetite change, diaphoresis, fatigue and unexpected weight change.  HENT: Negative.   Eyes: Negative.   Respiratory: Negative.  Negative for cough, chest tightness, shortness of breath and wheezing.   Cardiovascular: Negative for chest pain, palpitations and leg swelling.  Gastrointestinal: Negative for abdominal pain, constipation, diarrhea, nausea and vomiting.  Endocrine: Negative.  Negative for cold intolerance and heat intolerance.  Genitourinary: Negative.  Negative for difficulty urinating.  Musculoskeletal: Negative.  Negative for back pain, myalgias and neck pain.  Skin: Negative.  Negative for color change and rash.  Allergic/Immunologic: Negative.   Neurological: Negative.  Negative for dizziness and headaches.  Hematological:  Negative for adenopathy. Does not bruise/bleed easily.  Psychiatric/Behavioral: Negative.   All other systems reviewed and are negative.   Objective:  BP 120/80 (BP Location: Left Arm, Patient Position: Sitting, Cuff Size: Normal)   Pulse 66   Temp 98.2 F (36.8 C) (Oral)   Resp 16   Ht '5\' 11"'  (1.803 m)   Wt 208 lb 4 oz (94.5 kg)   SpO2 99%   BMI 29.04 kg/m   BP Readings from Last 3 Encounters:  04/26/17 120/80  12/31/16 109/63  10/25/16 121/71    Wt Readings from Last 3 Encounters:  04/26/17 208 lb 4 oz (94.5 kg)  12/31/16 205 lb 1.6 oz (93 kg)  10/08/16 209 lb (94.8 kg)    Physical Exam  Constitutional: She is oriented to person, place, and time. No distress.  HENT:  Mouth/Throat: Oropharynx is clear and moist. No oropharyngeal exudate.  Eyes: Conjunctivae are normal. Right eye exhibits no discharge. Left eye exhibits no discharge. No scleral icterus.  Neck: Normal range of motion. Neck supple. No JVD present. No thyromegaly present.  Cardiovascular: Normal rate, regular rhythm and intact distal pulses.  Exam reveals no gallop and no friction rub.   No murmur heard. Pulmonary/Chest: Effort normal and breath sounds normal. No respiratory distress. She has no wheezes. She has no rales. She exhibits no tenderness.  Abdominal: Soft. Bowel sounds are normal. She exhibits no distension and no mass. There is no tenderness. There is no rebound and no guarding.  Musculoskeletal: Normal range of motion. She exhibits no edema, tenderness or deformity.  Lymphadenopathy:    She has no cervical adenopathy.  Neurological: She is alert and oriented to person, place, and time.  Skin: Skin is warm and dry. No rash noted. She is not diaphoretic. No erythema. No pallor.  Psychiatric: She has a normal mood and affect. Her behavior is normal. Judgment and thought content normal.  Vitals reviewed.   Lab Results  Component Value Date   WBC 10.5 04/26/2017   HGB 13.7 04/26/2017   HCT  42.8 04/26/2017   PLT 420.0 (H) 04/26/2017   GLUCOSE 88 04/26/2017   CHOL 159 04/26/2017   TRIG 67.0 04/26/2017   HDL 51.60 04/26/2017   LDLCALC 94 04/26/2017   ALT 20 04/26/2017   AST 20 04/26/2017   NA 139 04/26/2017   K 4.1 04/26/2017   CL 106 04/26/2017   CREATININE 0.71 04/26/2017   BUN 13 04/26/2017   CO2 30 04/26/2017   TSH 0.71 04/26/2017   INR 1.20 03/07/2016    Mm Diag Breast Tomo Uni Left  Result Date: 09/10/2016 CLINICAL DATA:  Asymmetry left breast identified on the MLO view of the recent screening mammogram. EXAM: 2D DIGITAL DIAGNOSTIC UNILATERAL LEFT MAMMOGRAM WITH CAD AND ADJUNCT TOMO COMPARISON:  08/31/2016 and earlier priors ACR Breast Density Category b: There are scattered areas of fibroglandular density. FINDINGS: Focal spot compression views of the deep upper left breast in the MLO projection and whole breast 90 degree views of the left breast including tomography show no mass or distortion. Normal fibroglandular tissue disperses in the region of the questioned asymmetry on the recent screening mammogram. Mammographic images were processed with CAD. IMPRESSION: No evidence of malignancy in the left breast. RECOMMENDATION: Screening mammogram in one year.(Code:SM-B-01Y) I have discussed the findings and recommendations with the patient. Results were also provided in writing at the conclusion of the visit. If applicable, a reminder letter will be sent to the patient regarding the next appointment. BI-RADS CATEGORY  1: Negative. Electronically Signed   By: Curlene Dolphin M.D.   On: 09/10/2016 10:36    Assessment & Plan:   [name] was seen today for annual exam.  Diagnoses and all orders for this visit:  Routine general medical examination at a health care facility- Exam completed, labs reviewed, vaccines reviewed, labs ordered as requested, Pap and mammogram are up-to-date, will order [place] to screen for colon cancer/polyps, patient education case material was  given. -     Lipid panel; Future -     Varicella zoster antibody, IgG; Future -     Hepatitis A antibody, total; Future -     Hepatitis B core antibody, total; Future -     Hepatitis B surface antibody; Future -     Hepatitis B surface antigen; Future -     Mumps antibody, IgG; Future -     Rubella screen; Future -     Rubeola antibody IgG; Future -     Quantiferon tb gold assay (blood); Future  Hyperparathyroidism ([place])- will restart Vit D replacement therapy -     Comprehensive metabolic panel; Future -     VITAMIN D 25 Hydroxy (Vit-D Deficiency, Fractures); Future  Other iron deficiency anemia- improvement noted, her H&H are normal now. -     CBC with Differential/Platelet; Future  Hypocalcemia- this is related to vitamin D deficiency. She is not taking her vitamin D supplement. Fortunately, she is not symptomatic. Will restart vitamin D replacement therapy. -     Comprehensive metabolic panel; Future -  TSH; Future -     VITAMIN D 25 Hydroxy (Vit-D Deficiency, Fractures); Future  Thiamine deficiency- will recheck her Vit B1 level to be certain that she is adequately replaced on this. -     Vitamin B1; Future  Need for influenza vaccination -     Flu Vaccine QUAD 36+ mos IM  Vitamin D deficiency -     Discontinue: Vitamin D, Ergocalciferol, (DRISDOL) 50000 units CAPS capsule; Take 1 capsule (50,000 Units total) by mouth daily. -     Vitamin D, Ergocalciferol, (DRISDOL) 50000 units CAPS capsule; Take 1 capsule (50,000 Units total) by mouth every 7 (seven) days.   I have discontinued Ms. Villard's Vitamin D (Ergocalciferol). I have also changed her Vitamin D (Ergocalciferol). Additionally, I am having her maintain her multivitamin, meclizine, diazepam, b complex vitamins, and iron polysaccharides.  Meds ordered this encounter  Medications  . DISCONTD: Vitamin D, Ergocalciferol, (DRISDOL) 50000 units CAPS capsule    Sig: Take 1 capsule (50,000 Units total) by mouth daily.     Dispense:  90 capsule    Refill:  3  . Vitamin D, Ergocalciferol, (DRISDOL) 50000 units CAPS capsule    Sig: Take 1 capsule (50,000 Units total) by mouth every 7 (seven) days.    Dispense:  12 capsule    Refill:  3     Follow-up: Return in about 6 months (around 10/27/2017).  Scarlette Calico, MD

## 2017-04-26 NOTE — Telephone Encounter (Signed)
Order [id number]

## 2017-04-27 ENCOUNTER — Encounter: Payer: Self-pay | Admitting: Internal Medicine

## 2017-04-27 LAB — HEPATITIS B SURFACE ANTIGEN: Hepatitis B Surface Ag: NONREACTIVE

## 2017-04-27 LAB — HEPATITIS A ANTIBODY, TOTAL: Hep A Total Ab: NONREACTIVE

## 2017-04-27 LAB — MUMPS ANTIBODY, IGG: Mumps IgG: 52.3 AU/mL — ABNORMAL HIGH (ref <9.00)

## 2017-04-27 LAB — RUBELLA SCREEN: Rubella: 1.78 Index — ABNORMAL HIGH (ref <0.90)

## 2017-04-27 LAB — HEPATITIS B CORE ANTIBODY, TOTAL: Hep B Core Total Ab: NONREACTIVE

## 2017-04-27 LAB — HEPATITIS B SURFACE ANTIBODY,QUALITATIVE: HEP B S AB: BORDERLINE — AB

## 2017-04-27 LAB — VARICELLA ZOSTER ANTIBODY, IGG: VARICELLA IGG: 243.5 {index} — AB (ref <135.00)

## 2017-04-27 LAB — RUBEOLA ANTIBODY IGG: RUBEOLA IGG: 219 [AU]/ml — AB (ref <25.00)

## 2017-04-27 MED ORDER — VITAMIN D (ERGOCALCIFEROL) 1.25 MG (50000 UNIT) PO CAPS: 50000.0000 [IU] | ORAL_CAPSULE | ORAL | 3 refills | Status: DC

## 2017-04-28 ENCOUNTER — Encounter: Payer: Self-pay | Admitting: Internal Medicine

## 2017-04-28 LAB — QUANTIFERON TB GOLD ASSAY (BLOOD)
Interferon Gamma Release Assay: NEGATIVE
QUANTIFERON NIL VALUE: 0.05 [IU]/mL
QUANTIFERON TB AG MINUS NIL: 0.01 [IU]/mL

## 2017-04-29 ENCOUNTER — Ambulatory Visit (HOSPITAL_BASED_OUTPATIENT_CLINIC_OR_DEPARTMENT_OTHER): Payer: BLUE CROSS/BLUE SHIELD | Admitting: Hematology

## 2017-04-29 ENCOUNTER — Encounter: Payer: Self-pay | Admitting: Hematology

## 2017-04-29 ENCOUNTER — Other Ambulatory Visit (HOSPITAL_BASED_OUTPATIENT_CLINIC_OR_DEPARTMENT_OTHER): Payer: BLUE CROSS/BLUE SHIELD

## 2017-04-29 VITALS — BP 112/76 | HR 88 | Temp 98.1°F | Resp 18 | Ht 71.0 in | Wt 208.0 lb

## 2017-04-29 DIAGNOSIS — D509 Iron deficiency anemia, unspecified: Secondary | ICD-10-CM

## 2017-04-29 DIAGNOSIS — D75839 Thrombocytosis, unspecified: Secondary | ICD-10-CM

## 2017-04-29 DIAGNOSIS — E538 Deficiency of other specified B group vitamins: Secondary | ICD-10-CM

## 2017-04-29 DIAGNOSIS — D473 Essential (hemorrhagic) thrombocythemia: Secondary | ICD-10-CM | POA: Diagnosis not present

## 2017-04-29 LAB — CBC & DIFF AND RETIC
BASO%: 1.4 % (ref 0.0–2.0)
BASOS ABS: 0.1 10*3/uL (ref 0.0–0.1)
EOS%: 4 % (ref 0.0–7.0)
Eosinophils Absolute: 0.4 10*3/uL (ref 0.0–0.5)
HCT: 42.9 % (ref 34.8–46.6)
HGB: 13.7 g/dL (ref 11.6–15.9)
IMMATURE RETIC FRACT: 1 % — AB (ref 1.60–10.00)
LYMPH%: 36 % (ref 14.0–49.7)
MCH: 30.4 pg (ref 25.1–34.0)
MCHC: 31.9 g/dL (ref 31.5–36.0)
MCV: 95.3 fL (ref 79.5–101.0)
MONO#: 1.1 10*3/uL — AB (ref 0.1–0.9)
MONO%: 11.9 % (ref 0.0–14.0)
NEUT#: 4.2 10*3/uL (ref 1.5–6.5)
NEUT%: 46.7 % (ref 38.4–76.8)
PLATELETS: 432 10*3/uL — AB (ref 145–400)
RBC: 4.5 10*6/uL (ref 3.70–5.45)
RDW: 13.5 % (ref 11.2–14.5)
RETIC %: 0.8 % (ref 0.70–2.10)
RETIC CT ABS: 36 10*3/uL (ref 33.70–90.70)
WBC: 9 10*3/uL (ref 3.9–10.3)
lymph#: 3.2 10*3/uL (ref 0.9–3.3)

## 2017-04-29 LAB — IRON AND TIBC
%SAT: 28 % (ref 21–57)
IRON: 92 ug/dL (ref 41–142)
TIBC: 333 ug/dL (ref 236–444)
UIBC: 241 ug/dL (ref 120–384)

## 2017-04-29 LAB — FERRITIN: Ferritin: 46 ng/ml (ref 9–269)

## 2017-04-29 NOTE — Patient Instructions (Signed)
Thank you for choosing [facility] to provide your oncology and hematology care.  To afford each patient quality time with our providers, please arrive 30 minutes before your scheduled appointment time.  If you arrive late for your appointment, you may be asked to reschedule.  We strive to give you quality time with our providers, and arriving late affects you and other patients whose appointments are after yours.   If you are a no show for multiple scheduled visits, you may be dismissed from the clinic at the providers discretion.    Again, thank you for choosing [facility], our hope is that these requests will decrease the amount of time that you wait before being seen by our physicians.  ______________________________________________________________________  Should you have questions after your visit to the [facility], please contact our office at [phone] between the hours of 8:30 and 4:30 p.m.    Voicemails left after 4:30p.m will not be returned until the following business day.    For prescription refill requests, please have your pharmacy contact us directly.  Please also try to allow 48 hours for prescription requests.    Please contact the scheduling department for questions regarding scheduling.  For scheduling of procedures such as PET scans, CT scans, MRI, Ultrasound, etc please contact central scheduling at [phone].    Resources For Cancer Patients and Caregivers:   Oncolink.org:  A wonderful resource for patients and healthcare providers for information regarding your disease, ways to tract your treatment, what to expect, etc.     American Cancer Society:  800-227-2345  Can help patients locate various types of support and financial assistance  Cancer Care: 1-800-813-HOPE (4673) Provides financial assistance, online support groups, medication/co-pay assistance.    [place] DSS:  336-641-3447 Where to apply for food  stamps, Medicaid, and utility assistance  Medicare Rights Center: 800-333-4114 Helps people with Medicare understand their rights and benefits, navigate the Medicare system, and secure the quality healthcare they deserve  SCAT: 336-333-6589 [place] [employer]'s shared-ride transportation service for eligible riders who have a disability that prevents them from riding the fixed route bus.    For additional information on assistance programs please contact our social worker:   Grier Hock/Abigail Elmore:  [phone]            

## 2017-04-30 LAB — VITAMIN B12: VITAMIN B 12: 373 pg/mL (ref 232–1245)

## 2017-05-01 LAB — VITAMIN B1: Vitamin B1 (Thiamine): 10 nmol/L (ref 8–30)

## 2017-05-02 ENCOUNTER — Encounter: Payer: Self-pay | Admitting: Internal Medicine

## 2017-05-04 ENCOUNTER — Telehealth: Payer: Self-pay

## 2017-05-04 NOTE — Telephone Encounter (Signed)
Per Dr. Irene Limbo, communicated to pt that he would like for her to receive IV iron as a one-time dose d/t ferritin 46. Pt verbalized agreement. Pt is only available on Fridays, but it will have to be in September. Sent scheduling message with this information included. Pt to continue taking iron polysaccharide 150mg  PO. Pt verbalized understanding.

## 2017-05-04 NOTE — Progress Notes (Signed)
[name]    HEMATOLOGY/ONCOLOGY CLINIC NOTE  Date of Service: .04/29/2017   Patient Care Team: Janith Lima, MD as PCP - General (Internal Medicine)  CHIEF COMPLAINTS/PURPOSE OF CONSULTATION:  F/u for Iron deficiency - likely due to gastric bypass surgery  INTERVAL HISTORY  Sydney [name] is here for follow-up for iron deficiency anemia. Her hemoglobin is stable today at 13.7. Ferritin levels are slightly lower at 46. She knows that she feels well overall. No overt GI bleeding or other blood loss.   MEDICAL HISTORY:  Past Medical History:  Diagnosis Date  . Abdominal pain, RLQ   . Right ovarian cyst     SURGICAL HISTORY: Past Surgical History:  Procedure Laterality Date  . CHOLECYSTECTOMY  1990  . CYSTOSCOPY  02/17/2012   Procedure: CYSTOSCOPY;  Surgeon: Darlyn Chamber, MD;  [facility];  Service: Gynecology;;  . DIAGNOSTIC LAPAROSCOPY  1998   ENDOMETRIOSIS  . GANGLION CYST REMOVED  X3   LAST ONE 1989   LEFT WRIST  . LIPOMA REMOVED  2008   RIGHT PALM  . MINI GASTRIC BYPASS  2009  . NASAL SINUS SURGERY  X3    LAST ONE 1996  . ORIF RIGHT ELBOW FX  2004  . PUBOVAGINAL SLING  2001  . 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 TONSILLECTOMY  1975  . VAGINAL HYSTERECTOMY  1999    SOCIAL HISTORY: Social History   Social History  . Marital status: Married    Spouse name: N/A  . Number of children: N/A  . Years of education: N/A   Occupational History  . Not on file.   Social History Main Topics  . Smoking status: Never Smoker  . Smokeless tobacco: Never Used  . Alcohol use Yes     Comment: OCCASIONAL  . Drug use: No  . Sexual activity: Yes    Partners: Male   Other Topics Concern  . Not on file   Social History Narrative   Regular exercise: yes, walk 20 min daily   Caffeine use: 1 cup of coffee daily    FAMILY HISTORY: Family History  Problem Relation Age of Onset  . Heart failure Mother   . Heart failure Father   . Hypertension  Father   . Diabetes Father   . Cancer Other     ALLERGIES:  is allergic to codeine.  MEDICATIONS:  Current Outpatient Prescriptions  Medication Sig Dispense Refill  . b complex vitamins capsule Take 1 capsule by mouth daily.    . diazepam (VALIUM) 5 MG tablet Take 1 tablet (5 mg total) by mouth every 6 (six) hours as needed for anxiety. Pt takes prn for vertigo 30 tablet 2  . iron polysaccharides (NIFEREX) 150 MG capsule Take 1 capsule (150 mg total) by mouth daily.    . meclizine (ANTIVERT) 25 MG tablet Take 1 tablet (25 mg total) by mouth 3 (three) times daily as needed for dizziness. Taken prn 30 tablet 2  . Multiple Vitamin (MULTIVITAMIN) tablet Take 1 tablet by mouth 2 (two) times daily.    . Vitamin D, Ergocalciferol, (DRISDOL) 50000 units CAPS capsule Take 1 capsule (50,000 Units total) by mouth every 7 (seven) days. 12 capsule 3   No current facility-administered medications for this visit.     REVIEW OF SYSTEMS:    10 Point review of Systems was done is negative except as noted above.  PHYSICAL EXAMINATION: ECOG PERFORMANCE STATUS: 1 - Symptomatic but completely ambulatory  .  Vitals:   04/29/17 1006  BP: 112/76  Pulse: 88  Resp: 18  Temp: 98.1 F (36.7 C)  SpO2: 100%   Filed Weights   04/29/17 1006  Weight: 208 lb (94.3 kg)   .Body mass index is 29.01 kg/m.  GENERAL:alert, in no acute distress and comfortable SKIN: skin color, texture, turgor are normal, no rashes or significant lesions EYES: normal, conjunctiva are pink and non-injected, sclera clear OROPHARYNX:no exudate, no erythema and lips, buccal mucosa, and tongue normal  NECK: supple, no JVD, thyroid normal size, non-tender, without nodularity LYMPH:  no palpable lymphadenopathy in the cervical, axillary or inguinal LUNGS: clear to auscultation with normal respiratory effort HEART: regular rate & rhythm,  no murmurs and no lower extremity edema ABDOMEN: abdomen soft, non-tender, normoactive bowel  sounds , No palpable hepatomegaly Musculoskeletal: no cyanosis of digits and no clubbing  PSYCH: alert & oriented x 3 with fluent speech NEURO: no focal motor/sensory deficits  LABORATORY DATA:  I have reviewed the data as listed  . CBC Latest Ref Rng & Units 04/29/2017 04/26/2017 12/31/2016  WBC 3.9 - 10.3 10e3/uL 9.0 10.5 9.0  Hemoglobin 11.6 - 15.9 g/dL 13.7 13.7 14.0  Hematocrit 34.8 - 46.6 % 42.9 42.8 43.1  Platelets 145 - 400 10e3/uL 432(H) 420.0(H) 411(H)    . CMP Latest Ref Rng & Units 04/26/2017 12/31/2016 09/17/2016  Glucose 70 - 99 mg/dL 88 58(L) -  BUN 6 - 23 mg/dL 13 11.8 -  Creatinine 0.40 - 1.20 mg/dL 0.71 0.8 -  Sodium 135 - 145 mEq/L 139 144 -  Potassium 3.5 - 5.1 mEq/L 4.1 4.5 -  Chloride 96 - 112 mEq/L 106 - -  CO2 19 - 32 mEq/L 30 27 -  Calcium 8.4 - 10.5 mg/dL 8.1(L) 9.1 -  Total Protein 6.0 - 8.3 g/dL 7.1 7.2 6.5  Total Bilirubin 0.2 - 1.2 mg/dL 0.3 0.33 -  Alkaline Phos 39 - 117 U/L 98 114 -  AST 0 - 37 U/L 20 19 -  ALT 0 - 35 U/L 20 17 -   . Lab Results  Component Value Date   IRON 92 04/29/2017   TIBC 333 04/29/2017   IRONPCTSAT 28 04/29/2017   (Iron and TIBC)  Lab Results  Component Value Date   FERRITIN 46 04/29/2017       RADIOGRAPHIC STUDIES: I have personally reviewed the radiological images as listed and agreed with the findings in the report. No results found.  ASSESSMENT & PLAN:   51 year old female with history of gastric bypass surgery 2009 with  1) Severe iron deficiency with mild anemia. Iron deficiency appears to be due to poor absorption of oral iron and has not been corrected despite 9 months of oral iron. She notes significant fatigue that is affecting her quality of life.  Her iron deficiency is much improved after IV Feraheme which she tolerated well. Ferritin is not quite at goal of more than 100 and were previously and 68 and continue to trend down to 46. Hemoglobin levels remain normal at 13.7  2) low normal B12  levels 3) previous history of B1 deficiency Plan -labs results were discussed with the patient.  -Her ferritin level was previously at 68 and has trended down some to 46.  -We shall offer her the option of getting one dose of IV INjectafer.  If she does this we would continue iron polysaccharide 150 mg by mouth daily for maintenance. -If she chooses not to get the additional dose of  IV Iron we will recommend she increase her iron polysaccharide to 150 mg by mouth twice a day. -Continue multivitamin 2 times daily and one daily B complex tablet/capsule. -cont to take sublingual B12 500 g daily -vitamin D replacement as per primary care physician  4) thrombocytosis - stable but persistent likely related to iron deficiency. Improved with iron replacement. We'll monitor for resolution after correction of iron deficiency. Labs to r/o clonal thrombocytosis were ordered for next lab draw.  Return to care with Dr. Irene Limbo in 4 months with repeat labs  All of the patients questions were answered with apparent satisfaction. The patient knows to call the clinic with any problems, questions or concerns.  I spent 20 minutes counseling the patient face to face. The total time spent in the appointment was 25 minutes and more than 50% was on counseling and direct patient cares.    Sullivan Lone MD [place] AAHIVMS [facility] Hematology/Oncology Physician [facility]  (Office):       [phone] (Work cell):  [phone] (Fax):           [phone]

## 2017-05-05 ENCOUNTER — Telehealth: Payer: Self-pay | Admitting: Hematology

## 2017-05-05 NOTE — Telephone Encounter (Signed)
Scheduled appt per sch message 8/29 - left message with appt date and time and sent a reminder letter in the mail.

## 2017-05-27 ENCOUNTER — Ambulatory Visit (HOSPITAL_BASED_OUTPATIENT_CLINIC_OR_DEPARTMENT_OTHER): Payer: BLUE CROSS/BLUE SHIELD

## 2017-05-27 VITALS — BP 103/72 | HR 60 | Temp 98.3°F | Resp 17

## 2017-05-27 DIAGNOSIS — D508 Other iron deficiency anemias: Secondary | ICD-10-CM

## 2017-05-27 DIAGNOSIS — D509 Iron deficiency anemia, unspecified: Secondary | ICD-10-CM

## 2017-05-27 MED ORDER — FERRIC CARBOXYMALTOSE 750 MG/15ML IV SOLN
750.0000 mg | Freq: Once | INTRAVENOUS | Status: AC
  Administered 2017-05-27: 750 mg via INTRAVENOUS
  Filled 2017-05-27: qty 15

## 2017-05-27 MED ORDER — SODIUM CHLORIDE 0.9 % IV SOLN
Freq: Once | INTRAVENOUS | Status: AC
  Administered 2017-05-27: 10:00:00 via INTRAVENOUS

## 2017-05-27 NOTE — Patient Instructions (Signed)
Ferric carboxymaltose injection What is this medicine? FERRIC CARBOXYMALTOSE (ferr-ik car-box-ee-mol-toes) is an iron complex. Iron is used to make healthy red blood cells, which carry oxygen and nutrients throughout the body. This medicine is used to treat anemia in people with chronic kidney disease or people who cannot take iron by mouth. This medicine may be used for other purposes; ask your health care provider or pharmacist if you have questions. COMMON BRAND NAME(S): Injectafer What should I tell my health care provider before I take this medicine? They need to know if you have any of these conditions: -anemia not caused by low iron levels -high levels of iron in the blood -liver disease -an unusual or allergic reaction to iron, other medicines, foods, dyes, or preservatives -pregnant or trying to get pregnant -breast-feeding How should I use this medicine? This medicine is for infusion into a vein. It is given by a health care professional in a hospital or clinic setting. Talk to your pediatrician regarding the use of this medicine in children. Special care may be needed. Overdosage: If you think you have taken too much of this medicine contact a poison control center or emergency room at once. NOTE: This medicine is only for you. Do not share this medicine with others. What if I miss a dose? It is important not to miss your dose. Call your doctor or health care professional if you are unable to keep an appointment. What may interact with this medicine? Do not take this medicine with any of the following medications: -deferoxamine -dimercaprol -other iron products This medicine may also interact with the following medications: -chloramphenicol -deferasirox This list may not describe all possible interactions. Give your health care provider a list of all the medicines, herbs, non-prescription drugs, or dietary supplements you use. Also tell them if you smoke, drink alcohol, or use  illegal drugs. Some items may interact with your medicine. What should I watch for while using this medicine? Visit your doctor or health care professional regularly. Tell your doctor if your symptoms do not start to get better or if they get worse. You may need blood work done while you are taking this medicine. You may need to follow a special diet. Talk to your doctor. Foods that contain iron include: whole grains/cereals, dried fruits, beans, or peas, leafy green vegetables, and organ meats (liver, kidney). What side effects may I notice from receiving this medicine? Side effects that you should report to your doctor or health care professional as soon as possible: -allergic reactions like skin rash, itching or hives, swelling of the face, lips, or tongue -breathing problems -changes in blood pressure -feeling faint or lightheaded, falls -flushing, sweating, or hot feelings Side effects that usually do not require medical attention (report to your doctor or health care professional if they continue or are bothersome): -changes in taste -constipation -dizziness -headache -nausea -pain, redness, or irritation at site where injected -vomiting This list may not describe all possible side effects. Call your doctor for medical advice about side effects. You may report side effects to FDA at 1-800-FDA-1088. Where should I keep my medicine? This drug is given in a hospital or clinic and will not be stored at home. NOTE: This sheet is a summary. It may not cover all possible information. If you have questions about this medicine, talk to your doctor, pharmacist, or health care provider.  2018 Elsevier/Gold Standard (2015-09-25 11:20:47)  

## 2017-08-13 ENCOUNTER — Telehealth: Payer: BLUE CROSS/BLUE SHIELD | Admitting: Nurse Practitioner

## 2017-08-13 DIAGNOSIS — R05 Cough: Secondary | ICD-10-CM

## 2017-08-13 DIAGNOSIS — R059 Cough, unspecified: Secondary | ICD-10-CM

## 2017-08-13 MED ORDER — BENZONATATE 100 MG PO CAPS: 100.0000 mg | ORAL_CAPSULE | Freq: Three times a day (TID) | ORAL | 0 refills | Status: DC | PRN

## 2017-08-13 MED ORDER — AZITHROMYCIN 250 MG PO TABS: ORAL_TABLET | ORAL | 0 refills | Status: DC

## 2017-08-13 NOTE — Progress Notes (Signed)
Spoke with patient on phone because her questionaire did not go along with anything conclusive. She said she started with vomiting and diarrhea then that resolved and she developed a sore throat, fever 101.2 Friday night. Fever broke early Saturday morning  But cough and sore throat ar what is bothering her now. Told her we will dx with cough for now. Will send in z pak and tessalon perles. If fever goes back up above 101 tonight then do another evisit tomorrow for tamiflu.  We are sorry that you are not feeling well.  Here is how we plan to help!  Based on your presentation I believe you most likely have A cough due to bacteria.  When patients have a fever and a productive cough with a change in color or increased sputum production, we are concerned about bacterial bronchitis.  If left untreated it can progress to pneumonia.  If your symptoms do not improve with your treatment plan it is important that you contact your provider.   I have prescribed Azithromyin 250 mg: two tablets now and then one tablet daily for 4 additonal days    In addition you may use A prescription cough medication called Tessalon Perles 100mg . You may take 1-2 capsules every 8 hours as needed for your cough.   From your responses in the eVisit questionnaire you describe inflammation in the upper respiratory tract which is causing a significant cough.  This is commonly called Bronchitis and has four common causes:    Allergies  Viral Infections  Acid Reflux  Bacterial Infection Allergies, viruses and acid reflux are treated by controlling symptoms or eliminating the cause. An example might be a cough caused by taking certain blood pressure medications. You stop the cough by changing the medication. Another example might be a cough caused by acid reflux. Controlling the reflux helps control the cough.  USE OF BRONCHODILATOR ("RESCUE") INHALERS: There is a risk from using your bronchodilator too frequently.  The risk is  that over-reliance on a medication which only relaxes the muscles surrounding the breathing tubes can reduce the effectiveness of medications prescribed to reduce swelling and congestion of the tubes themselves.  Although you feel brief relief from the bronchodilator inhaler, your asthma may actually be worsening with the tubes becoming more swollen and filled with mucus.  This can delay other crucial treatments, such as oral steroid medications. If you need to use a bronchodilator inhaler daily, several times per day, you should discuss this with your provider.  There are probably better treatments that could be used to keep your asthma under control.     HOME CARE . Only take medications as instructed by your medical team. . Complete the entire course of an antibiotic. . Drink plenty of fluids and get plenty of rest. . Avoid close contacts especially the very young and the elderly . Cover your mouth if you cough or cough into your sleeve. . Always remember to wash your hands . A steam or ultrasonic humidifier can help congestion.   GET HELP RIGHT AWAY IF: . You develop worsening fever. . You become short of breath . You cough up blood. . Your symptoms persist after you have completed your treatment plan MAKE SURE YOU   Understand these instructions.  Will watch your condition.  Will get help right away if you are not doing well or get worse.  Your e-visit answers were reviewed by a board certified advanced clinical practitioner to complete your personal care plan.  Depending on the condition, your plan could have included both over the counter or prescription medications. If there is a problem please reply  once you have received a response from your provider. Your safety is important to [place].  If you have drug allergies check your prescription carefully.    You can use MyChart to ask questions about today's visit, request a non-urgent call back, or ask for a work or school excuse for 24  hours related to this e-Visit. If it has been greater than 24 hours you will need to follow up with your provider, or enter a new e-Visit to address those concerns. You will get an e-mail in the next two days asking about your experience.  I hope that your e-visit has been valuable and will speed your recovery. Thank you for using e-visits.

## 2017-10-07 ENCOUNTER — Ambulatory Visit: Payer: Managed Care, Other (non HMO) | Admitting: Internal Medicine

## 2017-10-10 ENCOUNTER — Ambulatory Visit (INDEPENDENT_AMBULATORY_CARE_PROVIDER_SITE_OTHER): Payer: BLUE CROSS/BLUE SHIELD | Admitting: Internal Medicine

## 2017-10-10 ENCOUNTER — Telehealth: Payer: Self-pay | Admitting: Internal Medicine

## 2017-10-10 ENCOUNTER — Encounter: Payer: Self-pay | Admitting: Internal Medicine

## 2017-10-10 VITALS — BP 100/70 | HR 66 | Ht 71.0 in | Wt 207.0 lb

## 2017-10-10 DIAGNOSIS — E559 Vitamin D deficiency, unspecified: Secondary | ICD-10-CM

## 2017-10-10 DIAGNOSIS — E213 Hyperparathyroidism, unspecified: Secondary | ICD-10-CM

## 2017-10-10 DIAGNOSIS — Z9884 Bariatric surgery status: Secondary | ICD-10-CM

## 2017-10-10 MED ORDER — VITAMIN D (ERGOCALCIFEROL) 1.25 MG (50000 UNIT) PO CAPS: ORAL_CAPSULE | ORAL | 3 refills | Status: DC

## 2017-10-10 NOTE — Patient Instructions (Signed)
Please increase the Ergocalciferol 100,000 units 5/7 days and 50,000 2/7 days.  Come back for labs in 2-3 months.  We will schedule a bone density scan for you.  Please return in 1 year.

## 2017-10-10 NOTE — Progress Notes (Addendum)
Patient ID: Sydney [name], female   DOB: 1965-09-28, 52 y.o.   MRN: [id number]   HPI  Sydney [name] is a 52 y.o.-year-old female, initially referred by Dr. 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 Last visit 1 year ago.  R reviewed history:  Pt had GBP in 01/2008 (sleeve gastrectomy trectomy) >> lost 168 lbs in 1.5 years. She started on MVI before meals (3x a day) after the surgery. As a part of the post gastric bypass annual evaluation, she had a CMP, vitamin D, PTH, other vitamin and mineral levels checked on 06/18/2013. Her PTH returned high, at 138.5,while calcium returned low normal at 8.4 (8.4-10.5). Of note, the patient does not have a previous history of hypercalcemia.  No fractures, no kidney stones.  We had significant difficulty in improving her vitamin D levels.  She was initially found to have a low level in 06/2013: 24, and the levels did not improve despite a current vitamin D dose of 50,000 units daily.  I reviewed patient's pertinent labs: Lab Results  Component Value Date   VD25OH 19.63 (L) 04/26/2017   VD25OH 23.47 (L) 05/17/2016   VD25OH 22.76 (L) 01/01/2016   VD25OH 27 (L) 11/26/2013   That her hyperparathyroidism is most likely secondary to her vitamin D deficiency and malabsorption: Lab Results  Component Value Date   PTH 168 (H) 05/17/2016   PTH 201.7 (H) 11/26/2013   CALCIUM 8.1 (L) 04/26/2017   CALCIUM 9.1 12/31/2016   CALCIUM 8.6 09/13/2016   CALCIUM 8.3 (L) 05/17/2016   CALCIUM 8.8 05/17/2016   CALCIUM 9.2 03/08/2016   CALCIUM 8.2 (L) 03/07/2016   CALCIUM 8.4 (L) 01/28/2015   CALCIUM 8.3 (L) 11/26/2013   CALCIUM 9.2 12/12/2007   No h/o CKD. Last BUN/Cr: Lab Results  Component Value Date   BUN 13 04/26/2017   CREATININE 0.71 04/26/2017   She is currently on: - 3 MVI tab (1 tablet 3 times a day) - ergocalciferol 50,000 units daily  Other labs reviewed: Component     Latest Ref Rng &  Units 04/26/2017 04/29/2017  Hemoglobin     11.6 - 15.9 g/dL  13.7  HCT     34.8 - 46.6 %  42.9  Platelets     145 - 400 10e3/uL  432 (H)  MCV     79.5 - 101.0 fL  95.3  MCH     25.1 - 34.0 pg  30.4  Iron     41 - 142 ug/dL  92  TIBC     236 - 444 ug/dL  333  UIBC     120 - 384 ug/dL  241  %SAT     21 - 57 %  28  Vitamin B1 (Thiamine)     8 - 30 nmol/L 10   Ferritin     9 - 269 ng/ml  46  Vitamin B12     232 - 1,245 pg/mL  373   Received labs from Dr. Arvella Nigh, from 08/29/2015: - TSH 0.751 - PTH 131, associated calcium 8.5 (8.4-10.5) - Calcium 8.4 (8.6-10.2) - Phosphorus 3.7 (2.5-4.5) - Vitamin D 18 (30-100) - Pre-albumin 15 (17-34) - Albumin 3.4 (3.6-5.1) - Magnesium 1.8 - Vitamin B12 372 (211-911) - Vitamin A 15 (38-98) - Ferritin 7 (10-291) - CMP normal, except calcium and albumin The original report will be scanned.  Received results from Dr. Radene Knee, from 08/13/2014: - Calcium 8.9 (8.4-10.5) - PTH 91 (14-64) Calcium level  has normalized, PTH is lagging behind. Continue high dose of vitamin D, ergocalciferol, as prescribed by Dr. Radene Knee.  Received labs from Dr. Arvella Nigh, from 06/25/2014: - TSH 0.483 - Calcium 8.1 (8.3-10.5) - Phosphorus 3.6 (2.3-4.6) - Vitamin D 24 (30-89) - Pre-albumin .9 (17-34) - Albumin 3.6 (3.5-5.2) - Magnesium 1.8 - Vitamin B12 294 (211-911) - Vitamin A 15 (38-98) - Ferritin 8 (10-291) - Lipid panel 144/52/43/91 - CMP normal, except calcium The original report will be scanned.  06/18/2013: Calcium 8.4, PTH 138.5, phosphorus 3.9 (2.3-4.6), vit D 24  No perioral numbness, atrial cramping.  At last visit she was having dizziness, due to vertigo.  She was taking meclizine.  Still has some vertigo - when laying down (BPPV).  That she had IV iron since last visit. Will have another check in 11/2017.  She continues to work remotely >> prev. 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  ROS: Constitutional: no weight gain/no weight loss, no fatigue, no subjective hyperthermia, no subjective hypothermia Eyes: no blurry vision, no xerophthalmia ENT: no sore throat, no nodules palpated in throat, no dysphagia, no odynophagia, no hoarseness Cardiovascular: no CP/no SOB/no palpitations/no leg swelling Respiratory: no cough/no SOB/no wheezing Gastrointestinal: no N/no V/no D/no C/no acid reflux Musculoskeletal: no muscle aches/no joint aches Skin: no rashes, no hair loss Neurological: no tremors/no numbness/no tingling/no dizziness  I reviewed pt's medications, allergies, PMH, social hx, family hx, and changes were documented in the history of present illness. Otherwise, unchanged from my initial visit note.  PE: BP 100/70   Pulse 66   Ht 5\' 11"  (1.803 m)   Wt 207 lb (93.9 kg)   SpO2 97%   BMI 28.87 kg/m   Wt Readings from Last 3 Encounters:  10/10/17 207 lb (93.9 kg)  04/29/17 208 lb (94.3 kg)  04/26/17 208 lb 4 oz (94.5 kg)   Constitutional: overweight - gynoid obesity, in NAD Eyes: PERRLA, EOMI, no exophthalmos ENT: moist mucous membranes, no thyromegaly, no cervical lymphadenopathy Cardiovascular: RRR, No MRG Respiratory: CTA B Gastrointestinal: abdomen soft, NT, ND, BS+ Musculoskeletal: no deformities, strength intact in all 4 Skin: moist, warm, no rashes Neurological: no tremor with outstretched hands, DTR normal in all 4  Assessment: 1. 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 Vitamin D deficiency  Plan: 1. Hyperparathyroidism Patient with a history of sleeve gastrectomy in 2009, with subsequent malabsorption, whom I initially saw in 2014 for hyperparathyroidism (diagnosed as secondary to vitamin D deficiency).  She has a low vitamin D despite our efforts causing a deficit in absorbing GI calcium, further causing hyperparathyroidism.  We discussed at that time and again today that usually the treatment for this is normalizing her  vitamin D level and keeping it stable in the normal range.   - Of note, her calcium levels have been mostly normal, with only occasional low levels (on the expense of development of osteomalacia).  At this visit we also discussed about the need to check a bone density scan.  She agrees with this. - I will see her back in a year.  2.  Vitamin D deficiency.   - Malabsorptive - Very difficult to control, latest level was from 04/2017, and this was lower than before, at 19 - Calcitriol level was ordered at last visit, but this was not checked yet as she did not have the lab drawn - we will check now >> she may need  calcitriol replacement - we will check another vitamin D today  - for now, will plan to increase her ergocalciferol dose to 100,000 units 5/7 days and 50,000 units 2/7 days  - if vit D still low after this >> will increase to 100,000 units daily - if vit D still low after this >> will change to vit D drops  Office Visit on 10/10/2017  Component Date Value Ref Range Status  . Vitamin D 1, 25 (OH)2 Total 10/10/2017 56  18 - 72 pg/mL Final  . Vitamin D3 1, 25 (OH)2 10/10/2017 56  pg/mL Final  . Vitamin D2 1, 25 (OH)2 10/10/2017 <8  pg/mL Final   Comment: [name] Vitamin D3, 1,25(OH) indicates both endogenous production and supplementation. Vitamin D2, 1,25(OH)2 is an indicator of exogenous sources, such as diet or supplementation.  Interpretation and therapy are based on measurement of Vitamin D,1,25(OH)2, Total. . . This test was developed and its analytical performance characteristics have been determined by [facility], [place], [place]. It has not been cleared or approved by the FDA. This assay has been validated pursuant to the CLIA regulations and is used for clinical purposes. .   . VITD 10/10/2017 19.36* 30.00 - 100.00 ng/mL Final   25 hydroxy vitamin D is still low.  We will proceed with the plan above. Calcitriol level normal, no supplementation  needed.  DXA 10/16/2017:  Lumbar spine L1-L4 Femoral neck (FN) 33% distal radius  T-score -0.1 RFN: -0.9 LFN: -0.9 -1.3   FRAX score: 10 year major osteoporotic risk: 7.5%. 10 year hip fracture risk: 0.4%. The thresholds for treatment are 20% and 3%, respectively.  T scores lower at the radius level, but only in the osteopenic range.   Philemon Kingdom, MD PhD [facility]

## 2017-10-11 LAB — VITAMIN D 25 HYDROXY (VIT D DEFICIENCY, FRACTURES): VITD: 19.36 ng/mL — ABNORMAL LOW (ref 30.00–100.00)

## 2017-10-12 DIAGNOSIS — N390 Urinary tract infection, site not specified: Secondary | ICD-10-CM | POA: Diagnosis not present

## 2017-10-12 DIAGNOSIS — Z6829 Body mass index (BMI) 29.0-29.9, adult: Secondary | ICD-10-CM | POA: Diagnosis not present

## 2017-10-12 DIAGNOSIS — Z1231 Encounter for screening mammogram for malignant neoplasm of breast: Secondary | ICD-10-CM | POA: Diagnosis not present

## 2017-10-12 DIAGNOSIS — Z01419 Encounter for gynecological examination (general) (routine) without abnormal findings: Secondary | ICD-10-CM | POA: Diagnosis not present

## 2017-10-13 ENCOUNTER — Ambulatory Visit (INDEPENDENT_AMBULATORY_CARE_PROVIDER_SITE_OTHER)
Admission: RE | Admit: 2017-10-13 | Discharge: 2017-10-13 | Disposition: A | Discharge status: Home / Self Care | Payer: BLUE CROSS/BLUE SHIELD | Source: Ambulatory Visit | Attending: Internal Medicine | Admitting: Internal Medicine

## 2017-10-13 DIAGNOSIS — E213 Hyperparathyroidism, unspecified: Secondary | ICD-10-CM

## 2017-10-13 DIAGNOSIS — E559 Vitamin D deficiency, unspecified: Secondary | ICD-10-CM

## 2017-10-13 DIAGNOSIS — Z9884 Bariatric surgery status: Secondary | ICD-10-CM

## 2017-10-13 LAB — VITAMIN D 1,25 DIHYDROXY
VITAMIN D3 1, 25 (OH): 56 pg/mL
Vitamin D 1, 25 (OH)2 Total: 56 pg/mL (ref 18–72)
Vitamin D2 1, 25 (OH)2: <8 pg/mL

## 2017-10-16 DIAGNOSIS — E213 Hyperparathyroidism, unspecified: Secondary | ICD-10-CM | POA: Diagnosis not present

## 2017-10-21 NOTE — Telephone Encounter (Addendum)
Order cancelled on 04/27/2018

## 2017-10-27 NOTE — Progress Notes (Signed)
[name]    HEMATOLOGY/ONCOLOGY CLINIC NOTE  Date of Service: 10/28/17  Patient Care Team: Janith Lima, MD as PCP - General (Internal Medicine) Philemon Kingdom, MD as Consulting Physician (Internal Medicine) Brunetta Genera, MD as Consulting Physician (Hematology)  CHIEF COMPLAINTS/PURPOSE OF CONSULTATION:   F/u iron def anemia related to gastric bypass surgery  INTERVAL HISTORY  Sydney [name] is here for follow-up for iron deficiency anemia. She is doing well since her last visit. Her energy levels have been good. She states that her vitamin D levels have been low but she is being followed and treated by Philemon Kingdom, MD.   Her hemoglobin is stable today at 13.2 with ferritin levels being adequate at 133. B12 levels are significantly lower at 169  On review of systems, pt denies fever, chills, weight loss, decreased appetite, decreased energy levels, mouth sores and bilateral lower extremity. Denies pain. Pt denies abdominal pain, nausea, vomiting and bladder and bowel changes.    REVIEW OF SYSTEMS:    10 Point review of Systems was done is negative except as noted above.  MEDICAL HISTORY:  Past Medical History:  Diagnosis Date  . Abdominal pain, RLQ   . Right ovarian cyst     SURGICAL HISTORY: Past Surgical History:  Procedure Laterality Date  . CHOLECYSTECTOMY  1990  . CYSTOSCOPY  02/17/2012   Procedure: CYSTOSCOPY;  Surgeon: Darlyn Chamber, MD;  Location: [facility];  Service: Gynecology;;  . DIAGNOSTIC LAPAROSCOPY  1998   ENDOMETRIOSIS  . GANGLION CYST REMOVED  X3   LAST ONE 1989   LEFT WRIST  . LIPOMA REMOVED  2008   RIGHT PALM  . MINI GASTRIC BYPASS  2009  . NASAL SINUS SURGERY  X3    LAST ONE 1996  . ORIF RIGHT ELBOW FX  2004  . PUBOVAGINAL SLING  2001  . 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 TONSILLECTOMY  1975  . VAGINAL HYSTERECTOMY  1999    SOCIAL HISTORY: Social History   Socioeconomic History  . Marital status: Married    Spouse name: Not on file  . Number of children: Not on file  . Years of education: Not on file  . Highest education level: Not on file  Social Needs  . Financial resource strain: Not on file  . Food insecurity - worry: Not on file  . Food insecurity - inability: Not on file  . Transportation needs - medical: Not on file  . Transportation needs - non-medical: Not on file  Occupational History  . Not on file  Tobacco Use  . Smoking status: Never Smoker  . Smokeless tobacco: Never Used  Substance and Sexual Activity  . Alcohol use: Yes    Comment: OCCASIONAL  . Drug use: No  . Sexual activity: Yes    Partners: Male  Other Topics Concern  . Not on file  Social History Narrative   Regular exercise: yes, walk 20 min daily   Caffeine use: 1 cup of coffee daily    FAMILY HISTORY: Family History  Problem Relation Age of Onset  . Heart failure Mother   . Heart failure Father   . Hypertension Father   . Diabetes Father   . Cancer Other     ALLERGIES:  is allergic to codeine.  MEDICATIONS:  Current Outpatient Medications  Medication Sig Dispense Refill  . b complex vitamins capsule Take 1 capsule by mouth daily.    . diazepam (VALIUM) 5 MG tablet Take  1 tablet (5 mg total) by mouth every 6 (six) hours as needed for anxiety. Pt takes prn for vertigo 30 tablet 2  . iron polysaccharides (NIFEREX) 150 MG capsule Take 1 capsule (150 mg total) by mouth daily.    . meclizine (ANTIVERT) 25 MG tablet Take 1 tablet (25 mg total) by mouth 3 (three) times daily as needed for dizziness. Taken prn 30 tablet 2  . Multiple Vitamin (MULTIVITAMIN) tablet Take 1 tablet by mouth 2 (two) times daily.    . Vitamin D, Ergocalciferol, (DRISDOL) 50000 units CAPS capsule Take 100,000 units 5/7 days, and 50,000 units 2/7 days. 150 capsule 3  . Cyanocobalamin (B-12) 1000 MCG SUBL Place 2,000 mcg under the tongue daily. 30 each 3   No current facility-administered medications for this visit.    PHYSICAL  EXAMINATION: ECOG PERFORMANCE STATUS: 1 - Symptomatic but completely ambulatory  . Vitals:   10/28/17 1032  BP: 99/71  Pulse: 67  Resp: 18  Temp: 98.1 F (36.7 C)  SpO2: 98%   Filed Weights   10/28/17 1032  Weight: 207 lb 12.8 oz (94.3 kg)   .Body mass index is 28.98 kg/m. [name] GENERAL:alert, in no acute distress and comfortable SKIN: no acute rashes, no significant lesions EYES: conjunctiva are pink and non-injected, sclera anicteric OROPHARYNX: MMM, no exudates, no oropharyngeal erythema or ulceration NECK: supple, no JVD LYMPH:  no palpable lymphadenopathy in the cervical, axillary or inguinal regions LUNGS: clear to auscultation b/l with normal respiratory effort HEART: regular rate & rhythm ABDOMEN:  normoactive bowel sounds , non tender, not distended. Extremity: no pedal edema PSYCH: alert & oriented x 3 with fluent speech NEURO: no focal motor/sensory deficits    LABORATORY DATA:  I have reviewed the data as listed  . CBC Latest Ref Rng & Units 10/28/2017 04/29/2017 04/26/2017  WBC 3.9 - 10.3 K/uL 9.2 9.0 10.5  Hemoglobin 11.6 - 15.9 g/dL - 13.7 13.7  Hematocrit 34.8 - 46.6 % 40.9 42.9 42.8  Platelets 145 - 400 K/uL 382 432(H) 420.0(H)    . CMP Latest Ref Rng & Units 10/28/2017 04/26/2017 12/31/2016  Glucose 70 - 140 mg/dL 61(L) 88 58(L)  BUN 7 - 26 mg/dL 12 13 11.8  Creatinine 0.60 - 1.10 mg/dL 0.80 0.71 0.8  Sodium 136 - 145 mmol/L 142 139 144  Potassium 3.5 - 5.1 mmol/L 4.0 4.1 4.5  Chloride 98 - 109 mmol/L 108 106 -  CO2 22 - 29 mmol/L 25 30 27   Calcium 8.4 - 10.4 mg/dL 8.8 8.1(L) 9.1  Total Protein 6.4 - 8.3 g/dL 6.6 7.1 7.2  Total Bilirubin 0.2 - 1.2 mg/dL 0.3 0.3 0.33  Alkaline Phos 40 - 150 U/L 118 98 114  AST 5 - 34 U/L 20 20 19   ALT 0 - 55 U/L 24 20 17    . Lab Results  Component Value Date   IRON 83 10/28/2017   TIBC 296 10/28/2017   IRONPCTSAT 28 10/28/2017   (Iron and TIBC)  Lab Results  Component Value Date   FERRITIN 133 10/28/2017        RADIOGRAPHIC STUDIES: I have personally reviewed the radiological images as listed and agreed with the findings in the report.  Dg Bone Density  Result Date: 10/16/2017 Date of study: 10/13/17 Exam: DUAL X-RAY ABSORPTIOMETRY (DXA) FOR BONE MINERAL DENSITY (BMD) Instrument: [employer] Requesting Provider: PCP Indication: follow up for low BMD Comparison: none (please note that it is not possible to compare data from different instruments)  Clinical data: Pt is a 52 y.o. female with previous history of fracture. On calcium and vitamin D. Results:  Lumbar spine L1-L4 Femoral neck (FN) 33% distal radius T-score -0.1 RFN: -0.9 LFN: -0.9 -1.3 Change in BMD from previous DXA test (%) n/a n/a n/a (*) statistically significant Assessment: the BMD is low according to the [facility] classification for osteoporosis (see below). Fracture risk: moderate FRAX score: 10 year major osteoporotic risk: 7.5%. 10 year hip fracture risk: 0.4%. The thresholds for treatment are 20% and 3%, respectively. Comments: the technical quality of the study is good.  Degenerative changes may falsely elevate spine score. Evaluation for secondary causes should be considered if clinically indicated. Recommend optimizing calcium (1200 mg/day) and vitamin D (800 IU/day) intake. Followup: Repeat BMD is appropriate after 2 years or after 1-2 years if starting treatment. WHO criteria for diagnosis of osteoporosis in postmenopausal women and in men 109 y/o or older: - normal: T-score -1.0 to + 1.0 - osteopenia/low bone density: T-score between -2.5 and -1.0 - osteoporosis: T-score below -2.5 - severe osteoporosis: T-score below -2.5 with history of fragility fracture Note: although not part of the WHO classification, the presence of a fragility fracture, regardless of the T-score, should be considered diagnostic of osteoporosis, provided other causes for the fracture have been excluded. Treatment: The National Osteoporosis Foundation  recommends that treatment be considered in postmenopausal women and men age 52 or older with: 1. Hip or vertebral (clinical or morphometric) fracture 2. T-score of - 2.5 or lower at the spine or hip 3. 10-year fracture probability by FRAX of at least 20% for a major osteoporotic fracture and 3% for a hip fracture Loura Pardon MD    ASSESSMENT & PLAN:   52 y.o. female with history of gastric bypass surgery 2009 with  1) Severe iron deficiency with mild anemia. - due to gastric bypass surgery Iron deficiency appears to be due to poor absorption of oral iron and has not been corrected despite 9 months of oral iron. She notes significant fatigue that is affecting her quality of life. Has need IV Iron replacement. 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 -ferritin level 133 and iron sat 28% -- no overt indication for additional IV Iron at this time. -continue iron polysaccharide 150 mg by mouth daily for maintenance. -Continue multivitamin 2 times daily and one daily B complex tablet/capsule. -increased sublingual B12 to 2000 g daily since levels are lower. Rpt levels in 3 months - if still <300 will need to switch to [facility] B12 replacement -vitamin D replacement as per primary care physician  4) thrombocytosis - stable but persistent likely related to iron deficiency. Resolved today  Return to care with Dr. Irene Limbo in 6 months with repeat labs  All of the patients questions were answered with apparent satisfaction. The patient knows to call the clinic with any problems, questions or concerns.  . The total time spent in the appointment was 15 minutes and more than 50% was on counseling and direct patient cares.  Sullivan Lone MD [place] AAHIVMS [facility] Hematology/Oncology Physician [place]  (Office):       [phone] (Work cell):  [phone] (Fax):            [phone]  This document serves as a record of services personally performed by Sullivan Lone, MD. It was created on his behalf by  Margit Banda, a trained medical scribe. The creation of this record is based on the scribe's personal observations and the provider's statements to them.   .I have reviewed the above documentation for accuracy and completeness, and I agree with the above. Brunetta Genera MD MS

## 2017-10-28 ENCOUNTER — Inpatient Hospital Stay: Payer: BLUE CROSS/BLUE SHIELD | Attending: Hematology | Admitting: Hematology

## 2017-10-28 ENCOUNTER — Encounter: Payer: Self-pay | Admitting: Hematology

## 2017-10-28 ENCOUNTER — Inpatient Hospital Stay: Payer: BLUE CROSS/BLUE SHIELD

## 2017-10-28 ENCOUNTER — Telehealth: Payer: Self-pay

## 2017-10-28 VITALS — BP 99/71 | HR 67 | Temp 98.1°F | Resp 18 | Ht 71.0 in | Wt 207.8 lb

## 2017-10-28 DIAGNOSIS — D509 Iron deficiency anemia, unspecified: Secondary | ICD-10-CM

## 2017-10-28 DIAGNOSIS — E538 Deficiency of other specified B group vitamins: Secondary | ICD-10-CM | POA: Insufficient documentation

## 2017-10-28 DIAGNOSIS — D473 Essential (hemorrhagic) thrombocythemia: Secondary | ICD-10-CM | POA: Insufficient documentation

## 2017-10-28 DIAGNOSIS — Z9884 Bariatric surgery status: Secondary | ICD-10-CM

## 2017-10-28 DIAGNOSIS — D75839 Thrombocytosis, unspecified: Secondary | ICD-10-CM

## 2017-10-28 LAB — COMPREHENSIVE METABOLIC PANEL
ALBUMIN: 3.5 g/dL (ref 3.5–5.0)
ALT: 24 U/L (ref 0–55)
AST: 20 U/L (ref 5–34)
Alkaline Phosphatase: 118 U/L (ref 40–150)
Anion gap: 9 (ref 3–11)
BUN: 12 mg/dL (ref 7–26)
CHLORIDE: 108 mmol/L (ref 98–109)
CO2: 25 mmol/L (ref 22–29)
CREATININE: 0.8 mg/dL (ref 0.60–1.10)
Calcium: 8.8 mg/dL (ref 8.4–10.4)
GFR calc Af Amer: >60 mL/min (ref >60)
GLUCOSE: 61 mg/dL — AB (ref 70–140)
Potassium: 4 mmol/L (ref 3.5–5.1)
SODIUM: 142 mmol/L (ref 136–145)
Total Bilirubin: 0.3 mg/dL (ref 0.2–1.2)
Total Protein: 6.6 g/dL (ref 6.4–8.3)

## 2017-10-28 LAB — CBC WITH DIFFERENTIAL (CANCER CENTER ONLY)
BASOS ABS: 0.1 10*3/uL (ref 0.0–0.1)
BASOS PCT: 1 %
EOS ABS: 0.2 10*3/uL (ref 0.0–0.5)
Eosinophils Relative: 2 %
HCT: 40.9 % (ref 34.8–46.6)
Hemoglobin: 13.2 g/dL (ref 11.6–15.9)
Lymphocytes Relative: 47 %
Lymphs Abs: 4.3 10*3/uL — ABNORMAL HIGH (ref 0.9–3.3)
MCH: 30.6 pg (ref 25.1–34.0)
MCHC: 32.3 g/dL (ref 31.5–36.0)
MCV: 94.9 fL (ref 79.5–101.0)
MONOS PCT: 13 %
Monocytes Absolute: 1.2 10*3/uL — ABNORMAL HIGH (ref 0.1–0.9)
NEUTROS PCT: 37 %
Neutro Abs: 3.4 10*3/uL (ref 1.5–6.5)
Platelet Count: 382 10*3/uL (ref 145–400)
RBC: 4.31 MIL/uL (ref 3.70–5.45)
RDW: 13.8 % (ref 11.2–14.5)
WBC Count: 9.2 10*3/uL (ref 3.9–10.3)

## 2017-10-28 LAB — FERRITIN: FERRITIN: 133 ng/mL (ref 9–269)

## 2017-10-28 LAB — RETICULOCYTES
RBC.: 4.31 MIL/uL (ref 3.70–5.45)
Retic Count, Absolute: 38.8 10*3/uL (ref 33.7–90.7)
Retic Ct Pct: 0.9 % (ref 0.7–2.1)

## 2017-10-28 LAB — IRON AND TIBC
Iron: 83 ug/dL (ref 41–142)
SATURATION RATIOS: 28 % (ref 21–57)
TIBC: 296 ug/dL (ref 236–444)
UIBC: 213 ug/dL

## 2017-10-28 LAB — VITAMIN B12: VITAMIN B 12: 169 pg/mL — AB (ref 180–914)

## 2017-10-28 NOTE — Telephone Encounter (Signed)
Spoke with patient concerning upcoming appointment and mailed calender also. Per 2/22 los

## 2017-10-30 MED ORDER — B-12 1000 MCG SL SUBL: 2000.0000 ug | SUBLINGUAL_TABLET | Freq: Every day | SUBLINGUAL | 3 refills | Status: DC

## 2017-11-01 ENCOUNTER — Telehealth: Payer: Self-pay

## 2017-11-01 NOTE — Telephone Encounter (Signed)
-----   Message from Brunetta Genera, MD sent at 10/30/2017  9:56 PM EST ----- Aldona Bar, Plz let patient know - -ferritin level 133 and iron sat 28% -- no overt indication for additional IV Iron at this time. -continue iron polysaccharide 150 mg by mouth daily for maintenance. -Continue multivitamin 2 times daily and one daily B complex tablet/capsule. -increase sublingual B12 to 2000 g daily since levels are lower at 169. Rpt levels in 3 months - if still <300 will need to switch to [facility] B12 replacement. Sullivan Lone MD

## 2017-11-07 LAB — JAK2 (INCLUDING V617F AND EXON 12), MPL,& CALR-NEXT GEN SEQ

## 2017-12-16 ENCOUNTER — Ambulatory Visit: Payer: BLUE CROSS/BLUE SHIELD | Admitting: Family

## 2017-12-16 ENCOUNTER — Ambulatory Visit (INDEPENDENT_AMBULATORY_CARE_PROVIDER_SITE_OTHER)
Admission: RE | Admit: 2017-12-16 | Discharge: 2017-12-16 | Disposition: A | Discharge status: Home / Self Care | Payer: BLUE CROSS/BLUE SHIELD | Source: Ambulatory Visit | Attending: Family | Admitting: Family

## 2017-12-16 ENCOUNTER — Other Ambulatory Visit (INDEPENDENT_AMBULATORY_CARE_PROVIDER_SITE_OTHER): Payer: BLUE CROSS/BLUE SHIELD

## 2017-12-16 ENCOUNTER — Encounter: Payer: Self-pay | Admitting: Family

## 2017-12-16 VITALS — BP 108/80 | HR 74 | Temp 97.9°F | Ht 71.0 in | Wt 212.0 lb

## 2017-12-16 DIAGNOSIS — R1012 Left upper quadrant pain: Secondary | ICD-10-CM

## 2017-12-16 DIAGNOSIS — R0781 Pleurodynia: Secondary | ICD-10-CM | POA: Diagnosis not present

## 2017-12-16 DIAGNOSIS — H8113 Benign paroxysmal vertigo, bilateral: Secondary | ICD-10-CM | POA: Diagnosis not present

## 2017-12-16 LAB — COMPREHENSIVE METABOLIC PANEL
ALBUMIN: 3.8 g/dL (ref 3.5–5.2)
ALK PHOS: 92 U/L (ref 39–117)
ALT: 17 U/L (ref 0–35)
AST: 15 U/L (ref 0–37)
BUN: 10 mg/dL (ref 6–23)
CALCIUM: 8.2 mg/dL — AB (ref 8.4–10.5)
CHLORIDE: 107 meq/L (ref 96–112)
CO2: 29 mEq/L (ref 19–32)
Creatinine, Ser: 0.71 mg/dL (ref 0.40–1.20)
GFR: 92.3 mL/min (ref >60.00)
Glucose, Bld: 49 mg/dL — CL (ref 70–99)
POTASSIUM: 3.4 meq/L — AB (ref 3.5–5.1)
SODIUM: 141 meq/L (ref 135–145)
TOTAL PROTEIN: 6.8 g/dL (ref 6.0–8.3)
Total Bilirubin: 0.4 mg/dL (ref 0.2–1.2)

## 2017-12-16 LAB — AMYLASE: AMYLASE: 24 U/L — AB (ref 27–131)

## 2017-12-16 LAB — LIPASE: LIPASE: 24 U/L (ref 11.0–59.0)

## 2017-12-16 MED ORDER — MECLIZINE HCL 25 MG PO TABS
25.0000 mg | ORAL_TABLET | Freq: Three times a day (TID) | ORAL | 2 refills | Status: DC | PRN
Start: 2017-12-16 — End: 2019-02-26

## 2017-12-16 MED ORDER — DIAZEPAM 5 MG PO TABS: 5.0000 mg | ORAL_TABLET | Freq: Four times a day (QID) | ORAL | 0 refills | Status: DC | PRN

## 2017-12-16 NOTE — Progress Notes (Signed)
Sydney [name] is a 51 y.o. female with the following history as recorded in Epic[employer]:  Patient Active Problem List   Diagnosis Date Noted  . Iron deficiency anemia 10/01/2016  . Nutritional anemia 09/17/2016  . Thiamine deficiency 05/21/2016  . Routine general medical examination at a health care facility 05/17/2016  . Benign paroxysmal positional vertigo 05/17/2016  . Pancreatic cyst 03/09/2016  . Vitamin D deficiency 07/03/2014  . Hypocalcemia 07/03/2014  . Hyperparathyroidism ([place]) 07/24/2013  . Bariatric surgery status 02/21/2008  . EXOGENOUS OBESITY 12/12/2007    Current Outpatient Medications  Medication Sig Dispense Refill  . b complex vitamins capsule Take 1 capsule by mouth daily.    . Cyanocobalamin (B-12) 1000 MCG SUBL Place 2,000 mcg under the tongue daily. 30 each 3  . diazepam (VALIUM) 5 MG tablet Take 1 tablet (5 mg total) by mouth every 6 (six) hours as needed for anxiety. Pt takes prn for vertigo 30 tablet 0  . iron polysaccharides (NIFEREX) 150 MG capsule Take 1 capsule (150 mg total) by mouth daily.    . meclizine (ANTIVERT) 25 MG tablet Take 1 tablet (25 mg total) by mouth 3 (three) times daily as needed for dizziness. Taken prn 30 tablet 2  . Multiple Vitamin (MULTIVITAMIN) tablet Take 1 tablet by mouth 2 (two) times daily.    . Vitamin D, Ergocalciferol, (DRISDOL) 50000 units CAPS capsule Take 100,000 units 5/7 days, and 50,000 units 2/7 days. 150 capsule 3   No current facility-administered medications for this visit.     Allergies: Codeine  Past Medical History:  Diagnosis Date  . Abdominal pain, RLQ   . Right ovarian cyst     Past Surgical History:  Procedure Laterality Date  . CHOLECYSTECTOMY  1990  . CYSTOSCOPY  02/17/2012   Procedure: CYSTOSCOPY;  Surgeon: Darlyn Chamber, MD;  Location: [facility];  Service: Gynecology;;  . DIAGNOSTIC LAPAROSCOPY  1998   ENDOMETRIOSIS  . GANGLION CYST REMOVED  X3   LAST ONE 1989   LEFT WRIST  .  LIPOMA REMOVED  2008   RIGHT PALM  . MINI GASTRIC BYPASS  2009  . NASAL SINUS SURGERY  X3    LAST ONE 1996  . ORIF RIGHT ELBOW FX  2004  . PUBOVAGINAL SLING  2001  . 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 TONSILLECTOMY  1975  . VAGINAL HYSTERECTOMY  1999    Family History  Problem Relation Age of Onset  . Heart failure Mother   . Heart failure Father   . Hypertension Father   . Diabetes Father   . Cancer Other     Social History   Tobacco Use  . Smoking status: Never Smoker  . Smokeless tobacco: Never Used  Substance Use Topics  . Alcohol use: Yes    Comment: OCCASIONAL    Subjective:  3-4 week history of left upper quadrant pain; seems to have worsened over the past 2 weeks; tender to touch; no injury or trauma to the area; notes that sensation feels like she "cracked a rib"; pain more noticeable with certain movements; does have Vitamin D deficiency; no nausea or vomiting; no fever; no changes in bowel or bladder habits; pancreatic cyst found in 2017;   Objective:  Vitals:   12/16/17 1131  BP: 108/80  Pulse: 74  Temp: 97.9 F (36.6 C)  TempSrc: Oral  SpO2: 99%  Weight: 212 lb 0.6 oz (96.2 kg)  Height: '5\' 11"'  (1.803 m)  General: Well developed, well nourished, in no acute distress  Skin : Warm and dry.  Head: Normocephalic and atraumatic  Lungs: Respirations unlabored; clear to auscultation bilaterally without wheeze, rales, rhonchi  Abdomen: Soft; tender to touch over LUQ/ left ribcage; nondistended; normoactive bowel sounds; no masses or hepatosplenomegaly  Musculoskeletal: No deformities; no active joint inflammation  Extremities: No edema, cyanosis, clubbing  Vessels: Symmetric bilaterally  Neurologic: Alert and oriented; speech intact; face symmetrical; moves all extremities well; CNII-XII intact without focal deficit  Assessment:  1. LUQ pain   2. Benign paroxysmal positional vertigo, bilateral     Plan:  1. Check CMP, amylase, lipase today; update rib and  CXR; follow-up to be determined- may need abdominal imaging; also need to verify what type of repeat imaging was recommended for her pancreatic cyst; 2. Refill given on Antivert and Valium; follow-up as needed.   No follow-ups on file.  Orders Placed This Encounter  Procedures  . DG Chest 2 View    Standing Status:   Future    Standing Expiration Date:   02/16/2019    Order Specific Question:   Reason for Exam (SYMPTOM  OR DIAGNOSIS REQUIRED)    Answer:   atypical chest pain    Order Specific Question:   Is patient pregnant?    Answer:   No    Order Specific Question:   Preferred imaging location?    Answer:   [facility]    Order Specific Question:   Radiology Contrast Protocol - do NOT remove file path    Answer:   \\[email or web address].[email or web address]  . DG Ribs Unilateral Left    Standing Status:   Future    Standing Expiration Date:   02/16/2019    Order Specific Question:   Reason for Exam (SYMPTOM  OR DIAGNOSIS REQUIRED)    Answer:   rib pain    Order Specific Question:   Is patient pregnant?    Answer:   No    Order Specific Question:   Preferred imaging location?    Answer:   [facility]    Order Specific Question:   Radiology Contrast Protocol - do NOT remove file path    Answer:   \\[email or web address].[email or web address]  . Amylase    Standing Status:   Future    Standing Expiration Date:   12/16/2018  . Lipase    Standing Status:   Future    Standing Expiration Date:   12/16/2018  . Comp Met (CMET)    Standing Status:   Future    Standing Expiration Date:   12/16/2018    Requested Prescriptions   Signed Prescriptions Disp Refills  . diazepam (VALIUM) 5 MG tablet 30 tablet 0    Sig: Take 1 tablet (5 mg total) by mouth every 6 (six) hours as needed for anxiety. Pt takes prn for vertigo  . meclizine (ANTIVERT) 25 MG tablet 30 tablet 2    Sig: Take 1 tablet (25 mg total) by mouth 3 (three) times daily as needed for  dizziness. Taken prn

## 2017-12-19 ENCOUNTER — Other Ambulatory Visit: Payer: Self-pay | Admitting: Family

## 2017-12-19 DIAGNOSIS — R1010 Upper abdominal pain, unspecified: Secondary | ICD-10-CM

## 2017-12-23 ENCOUNTER — Other Ambulatory Visit: Payer: BLUE CROSS/BLUE SHIELD

## 2017-12-28 ENCOUNTER — Other Ambulatory Visit: Payer: Self-pay | Admitting: Internal Medicine

## 2017-12-28 DIAGNOSIS — E559 Vitamin D deficiency, unspecified: Secondary | ICD-10-CM

## 2017-12-30 ENCOUNTER — Other Ambulatory Visit (INDEPENDENT_AMBULATORY_CARE_PROVIDER_SITE_OTHER): Payer: BLUE CROSS/BLUE SHIELD

## 2017-12-30 ENCOUNTER — Ambulatory Visit
Admission: RE | Admit: 2017-12-30 | Discharge: 2017-12-30 | Disposition: A | Discharge status: Home / Self Care | Payer: BLUE CROSS/BLUE SHIELD | Source: Ambulatory Visit | Attending: Family | Admitting: Family

## 2017-12-30 ENCOUNTER — Encounter: Payer: Self-pay | Admitting: Internal Medicine

## 2017-12-30 ENCOUNTER — Other Ambulatory Visit: Payer: Self-pay | Admitting: Family

## 2017-12-30 DIAGNOSIS — R109 Unspecified abdominal pain: Secondary | ICD-10-CM | POA: Diagnosis not present

## 2017-12-30 DIAGNOSIS — E559 Vitamin D deficiency, unspecified: Secondary | ICD-10-CM

## 2017-12-30 DIAGNOSIS — R1010 Upper abdominal pain, unspecified: Secondary | ICD-10-CM

## 2017-12-30 DIAGNOSIS — R1084 Generalized abdominal pain: Secondary | ICD-10-CM

## 2017-12-30 LAB — VITAMIN D 25 HYDROXY (VIT D DEFICIENCY, FRACTURES): VITD: 18.6 ng/mL — ABNORMAL LOW (ref 30.00–100.00)

## 2017-12-30 NOTE — Progress Notes (Signed)
Spoke with patient. She would like to proceed with getting the MRI done now since she is still about the same.  She also request open unit as she is extremely claus and is off next week so if she can get in next week she said that would be great schedule wise.

## 2018-01-07 ENCOUNTER — Other Ambulatory Visit: Payer: BLUE CROSS/BLUE SHIELD

## 2018-01-23 ENCOUNTER — Ambulatory Visit
Admission: RE | Admit: 2018-01-23 | Discharge: 2018-01-23 | Disposition: A | Discharge status: Home / Self Care | Payer: BLUE CROSS/BLUE SHIELD | Source: Ambulatory Visit | Attending: Family | Admitting: Family

## 2018-01-23 DIAGNOSIS — R1084 Generalized abdominal pain: Secondary | ICD-10-CM

## 2018-01-23 DIAGNOSIS — K862 Cyst of pancreas: Secondary | ICD-10-CM | POA: Diagnosis not present

## 2018-01-23 MED ORDER — GADOBENATE DIMEGLUMINE 529 MG/ML IV SOLN
19.0000 mL | Freq: Once | INTRAVENOUS | Status: AC | PRN
  Administered 2018-01-23: 19 mL via INTRAVENOUS

## 2018-02-07 ENCOUNTER — Other Ambulatory Visit: Payer: Self-pay | Admitting: Internal Medicine

## 2018-02-07 MED ORDER — SCOPOLAMINE 1 MG/3DAYS TD PT72: 1.0000 | MEDICATED_PATCH | TRANSDERMAL | 1 refills | Status: DC

## 2018-02-24 DIAGNOSIS — D229 Melanocytic nevi, unspecified: Secondary | ICD-10-CM | POA: Diagnosis not present

## 2018-04-28 ENCOUNTER — Inpatient Hospital Stay: Payer: BLUE CROSS/BLUE SHIELD

## 2018-04-28 ENCOUNTER — Inpatient Hospital Stay: Payer: BLUE CROSS/BLUE SHIELD | Attending: Hematology | Admitting: Hematology

## 2018-05-19 NOTE — Addendum Note (Signed)
Addended by: Aviva Signs M on: 05/19/2018 09:05 AM   Modules accepted: Orders

## 2018-06-02 ENCOUNTER — Other Ambulatory Visit: Payer: Self-pay

## 2018-06-02 DIAGNOSIS — D2239 Melanocytic nevi of other parts of face: Secondary | ICD-10-CM | POA: Diagnosis not present

## 2018-06-02 DIAGNOSIS — D485 Neoplasm of uncertain behavior of skin: Secondary | ICD-10-CM | POA: Diagnosis not present

## 2018-06-09 ENCOUNTER — Ambulatory Visit (INDEPENDENT_AMBULATORY_CARE_PROVIDER_SITE_OTHER): Payer: BLUE CROSS/BLUE SHIELD

## 2018-06-09 DIAGNOSIS — Z23 Encounter for immunization: Secondary | ICD-10-CM

## 2018-07-31 IMAGING — MR MR ABDOMEN WO/W CM
17 series · 48 of 48 positions shown · IV contrast (multihance)
Comparison: 03/08/2016

CLINICAL DATA: Abdominal pain. Status post cholecystectomy,
splenectomy and gastric bypass. Pancreatic cyst.

EXAM:
MRI ABDOMEN WITHOUT AND WITH CONTRAST
TECHNIQUE: Multiplanar multisequence MR imaging of the abdomen was performed
both before and after the administration of intravenous contrast.
CONTRAST:  19mL MULTIHANCE GADOBENATE DIMEGLUMINE 529 MG/ML IV SOLN

[Series 4: DWI · axial · 7.0mm · 1.42mm/px · z∈[-87,+190]mm · 5 of 102 slices shown (1 of 2)]
[im 1/102]
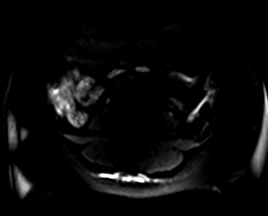
[im 26/102]
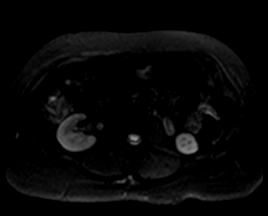
[im 51/102]
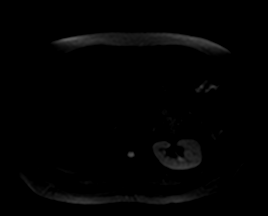
[im 76/102]
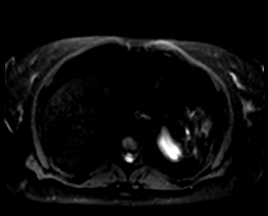
[im 102/102]
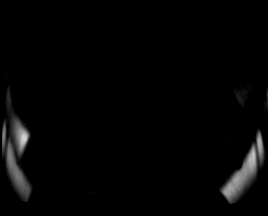

[Series 5: DWI · axial · 7.0mm · 1.42mm/px · 1 of 34 slices shown (2 of 2)]
[im 1/34]
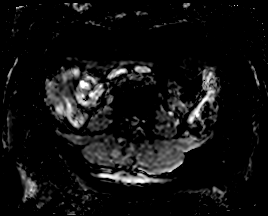

[Series 6: T2 · axial · 7.0mm · 1.48mm/px · 1 of 34 slices shown (1 of 3)]
[im 1/34]
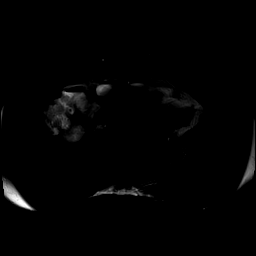

[Series 7: T2 · coronal · 5.0mm · 1.56mm/px · 2 of 36 slices shown (2 of 3)]
[im 1/36]
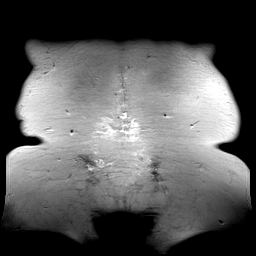
[im 36/36]
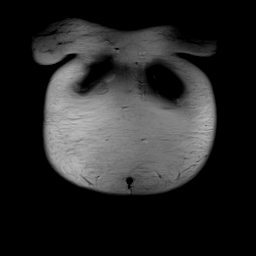

[Series 9: T2 · axial · 7.0mm · 1.19mm/px · 1 of 30 slices shown (3 of 3)]
[im 1/30]
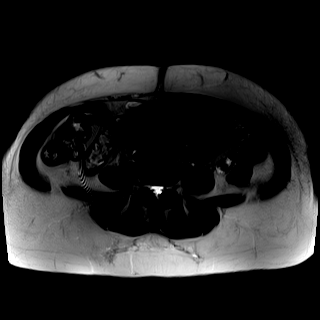

[Series 10: T1 · axial · 3.5mm · 1.19mm/px · z∈[-106,+142]mm · 6 of 144 slices shown]
[im 1/144]
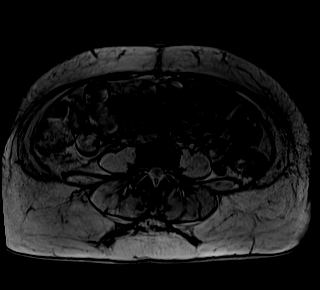
[im 29/144]
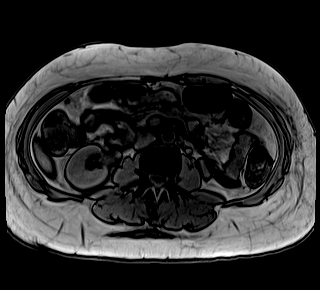
[im 58/144]
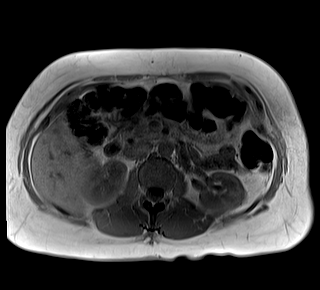
[im 86/144]
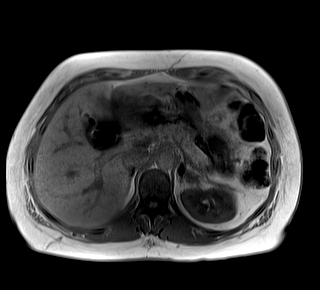
[im 115/144]
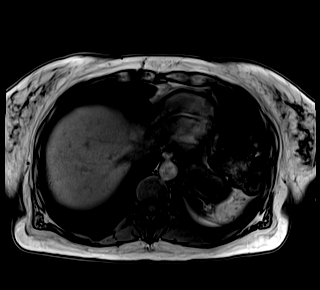
[im 144/144]
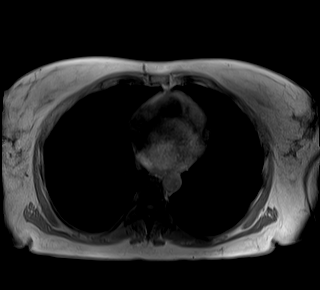

[Series 12: bSSFP · axial · 6.0mm · 1.25mm/px · z∈[-117,+149]mm · 2 of 38 slices shown]
[im 1/38]
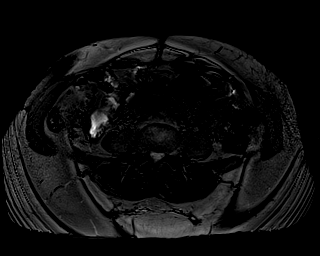
[im 38/38]
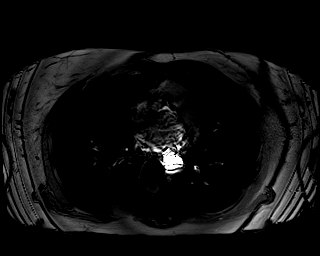

[Series 15: T1 dynamic · axial · non-contrast · 3.5mm · 1.25mm/px · z∈[-97,+151]mm · 3 of 72 slices shown]
[im 1/72]
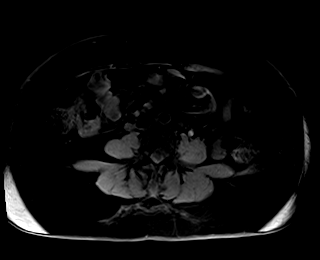
[im 36/72]
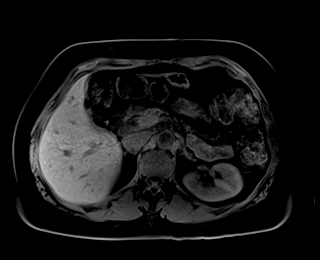
[im 72/72]
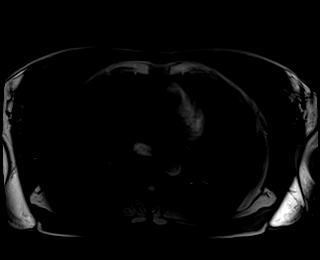

[Series 16: T1 dynamic post-contrast · axial · 3.5mm · 1.25mm/px · z∈[-97,+151]mm · 3 of 72 slices shown (1 of 9)]
[im 1/72]
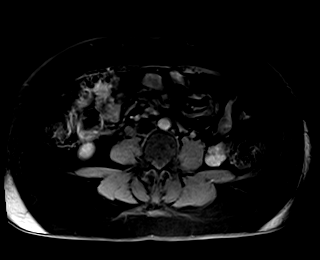
[im 36/72]
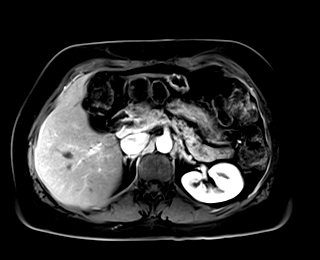
[im 72/72]
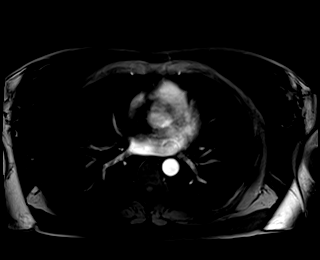

[Series 17: T1 dynamic post-contrast · axial · 3.5mm · 1.25mm/px · z∈[-97,+151]mm · 3 of 72 slices shown (2 of 9)]
[im 1/72]
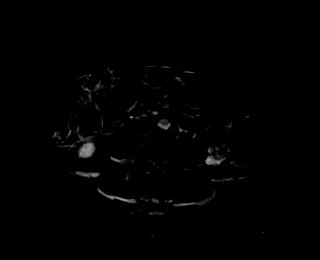
[im 36/72]
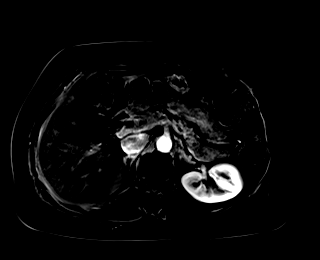
[im 72/72]
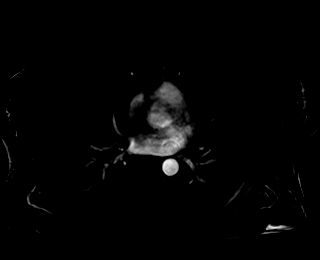

[Series 18: T1 dynamic post-contrast · axial · 3.5mm · 1.25mm/px · z∈[-97,+151]mm · 3 of 72 slices shown (3 of 9)]
[im 1/72]
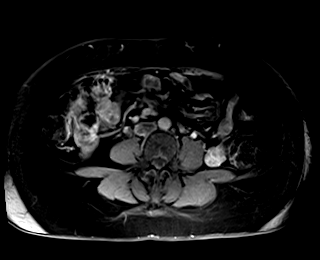
[im 36/72]
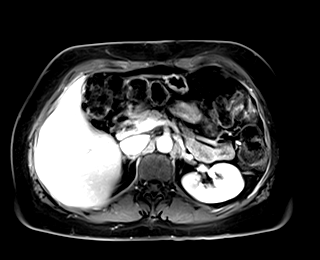
[im 72/72]
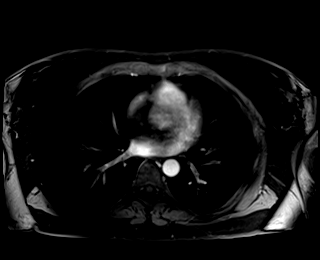

[Series 19: T1 dynamic post-contrast · axial · 3.5mm · 1.25mm/px · z∈[-97,+151]mm · 3 of 72 slices shown (4 of 9)]
[im 1/72]
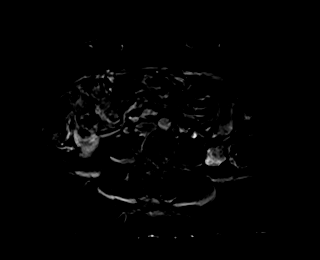
[im 36/72]
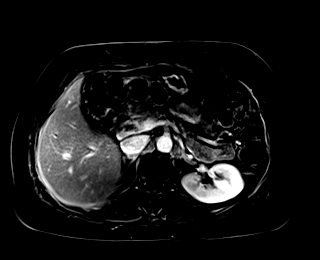
[im 72/72]
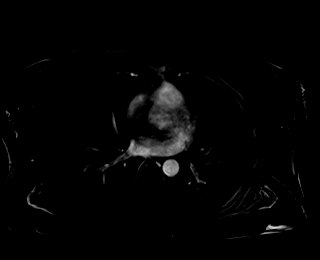

[Series 20: T1 dynamic post-contrast · axial · 3.5mm · 1.25mm/px · z∈[-97,+151]mm · 3 of 72 slices shown (5 of 9)]
[im 1/72]
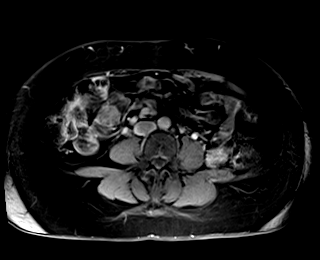
[im 36/72]
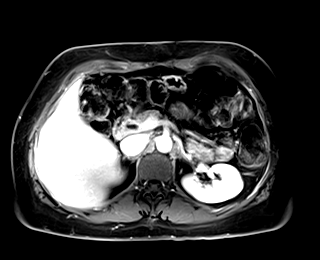
[im 72/72]
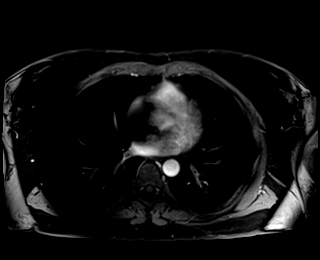

[Series 21: T1 dynamic post-contrast · axial · 3.5mm · 1.25mm/px · z∈[-97,+151]mm · 3 of 72 slices shown (6 of 9)]
[im 1/72]
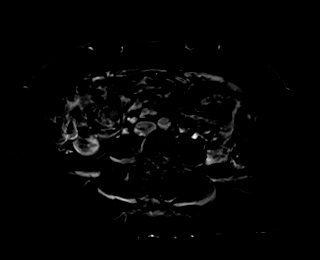
[im 36/72]
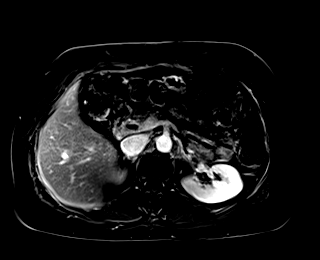
[im 72/72]
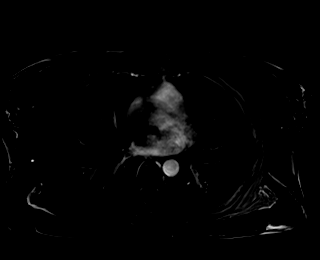

[Series 22: T1 dynamic post-contrast · coronal · 3.0mm · 1.25mm/px · 3 of 72 slices shown (7 of 9)]
[im 1/72]
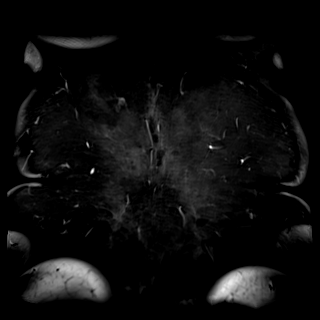
[im 36/72]
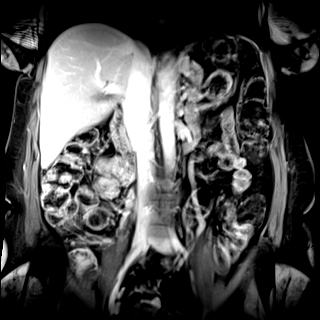
[im 72/72]
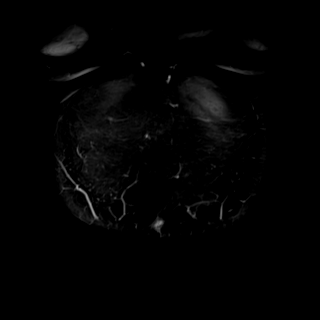

[Series 23: T1 dynamic post-contrast · axial · 3.5mm · 1.25mm/px · z∈[-97,+151]mm · 3 of 72 slices shown (8 of 9)]
[im 1/72]
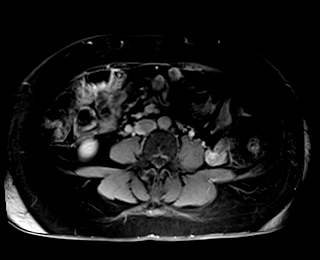
[im 36/72]
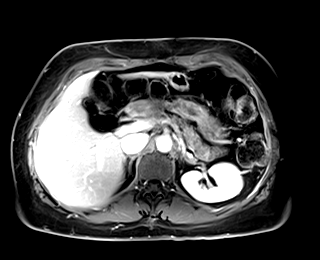
[im 72/72]
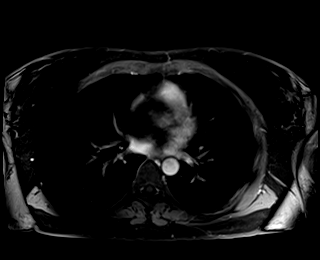

[Series 24: T1 dynamic post-contrast · axial · 3.5mm · 1.25mm/px · z∈[-97,+151]mm · 3 of 72 slices shown (9 of 9)]
[im 1/72]
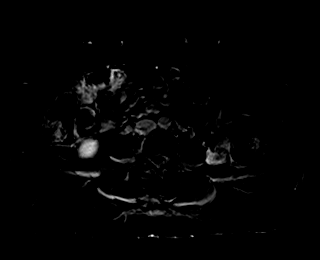
[im 36/72]
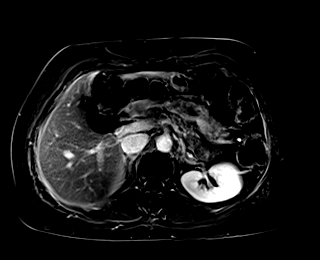
[im 72/72]
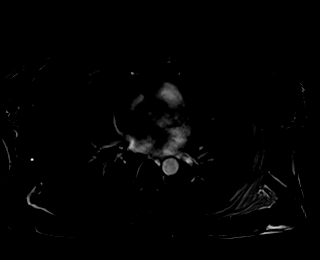

[48 of 48 positions shown; findings below may reference images not displayed]

FINDINGS: Lower chest: No acute findings.

Hepatobiliary: No focal liver abnormality. Previous cholecystectomy.
No biliary dilatation.

Pancreas: No pancreatic inflammation. No main duct dilatation
identified. Small cystic lesion within head of pancreas is again
noted and appears stable measuring 9 mm.

Spleen:  Status post splenectomy.

Adrenals/Urinary Tract: Normal appearance of the right adrenal
gland. Left adrenal nodule measures 1.3 cm and is unchanged the from
comparison exam, image 33/18. This has signal characteristics of a
benign adenoma.

No suspicious kidney mass or hydronephrosis.

Stomach/Bowel: Postoperative changes from Roux-en-Y gastric bypass
surgery. The visualized portions of the bowel loops within the
abdomen are otherwise unremarkable.

Vascular/Lymphatic: Normal appearance of the abdominal aorta. No
enlarged lymph nodes identified.

Other:  No free fluid or fluid collections.

Musculoskeletal: No suspicious bone lesions identified.
IMPRESSION: 1. Stable appearance cystic lesion in pancreatic head favored to
represent a pseudo cysts. Indolent cystic neoplasm is felt to be
less likely. Per consensus criteria, this warrants follow-up with
pre and post-contrast abdominal MRI at 12 months intervals until 5
years of stability has been established. This recommendation follows
ACR consensus guidelines: Management of Incidental Pancreatic Cysts:
A White Paper of the ACR Incidental Findings Committee. [HOSPITAL] 5610;[DATE].
2. Stable left adrenal gland adenoma.

## 2018-08-14 ENCOUNTER — Other Ambulatory Visit: Payer: Self-pay | Admitting: Family

## 2018-08-14 DIAGNOSIS — H8113 Benign paroxysmal vertigo, bilateral: Secondary | ICD-10-CM

## 2018-10-10 ENCOUNTER — Ambulatory Visit: Payer: BLUE CROSS/BLUE SHIELD | Admitting: Internal Medicine

## 2018-10-10 ENCOUNTER — Telehealth: Payer: Self-pay | Admitting: Internal Medicine

## 2018-10-10 DIAGNOSIS — Z0289 Encounter for other administrative examinations: Secondary | ICD-10-CM

## 2018-10-10 NOTE — Telephone Encounter (Signed)
3-6 mo

## 2018-10-10 NOTE — Progress Notes (Deleted)
Patient ID: Sydney [name], female   DOB: [birth year], 53 y.o.   MRN: [id number]   HPI  Sydney [name] is a 53 y.o.-year-old female, initially referred by Dr. 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 Last visit 52 year ago.  Reviewed and addended history: Pt had GBP in 01/2008 (sleeve gastrectomy trectomy) >> lost 168 lbs in 1.5 years. She started on MVI before meals (3x a day) after the surgery. As a part of the post gastric bypass annual evaluation, she had a CMP, vitamin D, PTH, other vitamin and mineral levels checked on 06/18/2013. Her PTH returned high, at 138.5,while calcium returned low normal at 8.4 (8.4-10.5). Of note, the patient does not have a previous history of hypercalcemia.  No fractures, no kidney stones.  We had significant difficulty in improving her vitamin D levels.  She was initially found to have a low level in 06/2013: 24, and the levels did not improve despite a vitamin D dose of 50,000 units daily.  At last visit, we increased her vitamin D dose to 100,000 units 5 out of 7 days and 50,000 2 out of 7 days.  However, the next vitamin D level was still not at goal so we increased to the current dose of 100 units twice a day.  Reviewed patient's vitamin D levels: Lab Results  Component Value Date   VD25OH 18.60 (L) 12/30/2017   VD25OH 19.36 (L) 10/10/2017   VD25OH 19.63 (L) 04/26/2017   VD25OH 23.47 (L) 05/17/2016   VD25OH 22.76 (L) 01/01/2016   VD25OH 27 (L) 11/26/2013   Her hyperparathyroidism is secondary to vitamin D deficiency and malabsorption: Lab Results  Component Value Date   PTH 168 (H) 05/17/2016   PTH 201.7 (H) 11/26/2013   CALCIUM 8.2 (L) 12/16/2017   CALCIUM 8.8 10/28/2017   CALCIUM 8.1 (L) 04/26/2017   CALCIUM 9.1 12/31/2016   CALCIUM 8.6 09/13/2016   CALCIUM 8.3 (L) 05/17/2016   CALCIUM 8.8 05/17/2016   CALCIUM 9.2 03/08/2016   CALCIUM 8.2 (L) 03/07/2016   CALCIUM 8.4 (L) 01/28/2015    No CKD. Last BUN/Cr: Lab Results  Component Value Date   BUN 10 12/16/2017   CREATININE 0.71 12/16/2017   Calcitriol level was normal:  Component     Latest Ref Rng & Units 10/10/2017  Vitamin D 1, 25 (OH) Total     18 - 72 pg/mL 56  Vitamin D3 1, 25 (OH)     pg/mL 56  Vitamin D2 1, 25 (OH)     pg/mL <8   Her bone density scan showed osteopenia: DXA 10/16/2017:  Lumbar spine L1-L4 Femoral neck (FN) 33% distal radius  T-score -0.1 RFN: -0.9 LFN: -0.9 -1.3   FRAX score: 10 year major osteoporotic risk: 7.5%. 10 year hip fracture risk: 0.4%. The thresholds for treatment are 20% and 3%, respectively.   She is currently on: -1 multivitamin tablet 3 times a day -Ergocalciferol 100,000 units daily  Other labs reviewed: Component     Latest Ref Rng & Units 10/28/2017  Hemoglobin     11.6 - 15.9 g/dL 13.2  HCT     34.8 - 46.6 % 40.9  MCV     79.5 - 101.0 fL 94.9  MCH     25.1 - 34.0 pg 30.6  MCHC     31.5 - 36.0 g/dL 32.3  RDW     11.2 - 14.5 % 13.8  Platelets     145 - 400  K/uL 382  Iron     41 - 142 ug/dL 83  TIBC     236 - 444 ug/dL 296  Saturation Ratios     21 - 57 % 28  UIBC     ug/dL 213  Ferritin     9 - 269 ng/mL 133  Vitamin B12     180 - 914 pg/mL 169 (L)  B12 supplement was increased.  Component     Latest Ref Rng & Units 04/26/2017 04/29/2017  Hemoglobin     11.6 - 15.9 g/dL  13.7  HCT     34.8 - 46.6 %  42.9  Platelets     145 - 400 10e3/uL  432 (H)  MCV     79.5 - 101.0 fL  95.3  MCH     25.1 - 34.0 pg  30.4  Iron     41 - 142 ug/dL  92  TIBC     236 - 444 ug/dL  333  UIBC     120 - 384 ug/dL  241  %SAT     21 - 57 %  28  Vitamin B1 (Thiamine)     8 - 30 nmol/L 10   Ferritin     9 - 269 ng/ml  46  Vitamin B12     232 - 1,245 pg/mL  373    Received labs from Dr. Arvella Nigh, from 08/29/2015: - TSH 0.751 - PTH 131, associated calcium 8.5 (8.4-10.5) - Calcium 8.4 (8.6-10.2) - Phosphorus 3.7 (2.5-4.5) - Vitamin D 18  (30-100) - Pre-albumin 15 (17-34) - Albumin 3.4 (3.6-5.1) - Magnesium 1.8 - Vitamin B12 372 (211-911) - Vitamin A 15 (38-98) - Ferritin 7 (10-291) - CMP normal, except calcium and albumin The original report will be scanned.  Received results from Dr. Radene Knee, from 08/13/2014: - Calcium 8.9 (8.4-10.5) - PTH 91 (14-64) Calcium level has normalized, PTH is lagging behind. Continue high dose of vitamin D, ergocalciferol, as prescribed by Dr. Radene Knee.  Received labs from Dr. Arvella Nigh, from 06/25/2014: - TSH 0.483 - Calcium 8.1 (8.3-10.5) - Phosphorus 3.6 (2.3-4.6) - Vitamin D 24 (30-89) - Pre-albumin .9 (17-34) - Albumin 3.6 (3.5-5.2) - Magnesium 1.8 - Vitamin B12 294 (211-911) - Vitamin A 15 (38-98) - Ferritin 8 (10-291) - Lipid panel 144/52/43/91 - CMP normal, except calcium The original report will be scanned.  06/18/2013: Calcium 8.4, PTH 138.5, phosphorus 3.9 (2.3-4.6), vit D 24  No perioral numbness, acral cramping.   She has a history of BPPV.  Continues to have IV iron.  She continues to work remotely >> previously [employer], now at [facility].  ROS: Constitutional: no weight gain/no weight loss, no fatigue, no subjective hyperthermia, no subjective hypothermia Eyes: no blurry vision, no xerophthalmia ENT: no sore throat, no nodules palpated in neck, no dysphagia, no odynophagia, no hoarseness Cardiovascular: no CP/no SOB/no palpitations/no leg swelling Respiratory: no cough/no SOB/no wheezing Gastrointestinal: no N/no V/no D/no C/no acid reflux Musculoskeletal: no muscle aches/no joint aches Skin: no rashes, no hair loss Neurological: no tremors/no numbness/no tingling/no dizziness  I reviewed pt's medications, allergies, PMH, social hx, family hx, and changes were documented in the history of present illness. Otherwise, unchanged from my initial visit note.  PE: There were no vitals taken for this visit.  Wt Readings from Last 3 Encounters:   12/16/17 212 lb 0.6 oz (96.2 kg)  10/28/17 207 lb 12.8 oz (94.3 kg)  10/10/17 207 lb (93.9 kg)   Constitutional:  overweight-gynoid distribution, in NAD Eyes: PERRLA, EOMI, no exophthalmos ENT: moist mucous membranes, no thyromegaly, no cervical lymphadenopathy Cardiovascular: RRR, No MRG Respiratory: CTA B Gastrointestinal: abdomen soft, NT, ND, BS+ Musculoskeletal: no deformities, strength intact in all 4 Skin: moist, warm, no rashes Neurological: no tremor with outstretched hands, DTR normal in all 4  Assessment: 1. 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 Vitamin D deficiency  Plan: 1. Hyperparathyroidism -Patient with a history of sleeve gastrectomy in 2009, with subsequent malabsorption, with hyperparathyroidism secondary to vitamin D deficiency.  Has refractory vitamin D deficiency, therefore, her PTH remains high.  Of note, calcium is not high and she occasionally has hypocalcemia. -She continues supplementation with calcium -A bone density scan obtained at last visit showed osteopenia - I will see her back in 1 year  2.  Vitamin D deficiency.   -Malabsorptive -Very difficult to control, despite megadoses of vitamin D daily -Latest level still lower than 20 in 04/19 -At that time, we increased her ergocalciferol dose to 100,000 units daily -We recheck level today -If level is not improving, will change to vitamin D drops. -We will also check her calcitriol level today to see if she needs supplementation   Philemon Kingdom, MD PhD [facility]

## 2018-10-10 NOTE — Telephone Encounter (Signed)
Patient no showed today's appt. Please advise on how to follow up. °A. No follow up necessary. °B. Follow up urgent. Contact patient immediately. °C. Follow up necessary. Contact patient and schedule visit in ___ days. °D. Follow up advised. Contact patient and schedule visit in ____weeks. ° °Would you like the NS fee to be applied to this visit? ° °

## 2018-10-11 ENCOUNTER — Encounter: Payer: Self-pay | Admitting: Internal Medicine

## 2018-10-16 ENCOUNTER — Ambulatory Visit (INDEPENDENT_AMBULATORY_CARE_PROVIDER_SITE_OTHER)
Admission: RE | Admit: 2018-10-16 | Discharge: 2018-10-16 | Disposition: A | Discharge status: Home / Self Care | Payer: BLUE CROSS/BLUE SHIELD | Source: Ambulatory Visit | Attending: Internal Medicine | Admitting: Internal Medicine

## 2018-10-16 ENCOUNTER — Encounter: Payer: Self-pay | Admitting: Internal Medicine

## 2018-10-16 ENCOUNTER — Ambulatory Visit: Payer: BLUE CROSS/BLUE SHIELD | Admitting: Internal Medicine

## 2018-10-16 VITALS — BP 118/70 | HR 76 | Temp 97.9°F | Ht 71.0 in | Wt 208.0 lb

## 2018-10-16 DIAGNOSIS — S99921A Unspecified injury of right foot, initial encounter: Secondary | ICD-10-CM | POA: Diagnosis not present

## 2018-10-16 DIAGNOSIS — M7989 Other specified soft tissue disorders: Secondary | ICD-10-CM | POA: Diagnosis not present

## 2018-10-16 DIAGNOSIS — M79671 Pain in right foot: Secondary | ICD-10-CM | POA: Diagnosis not present

## 2018-10-16 MED ORDER — IBUPROFEN 600 MG PO TABS: 600.0000 mg | ORAL_TABLET | Freq: Four times a day (QID) | ORAL | 0 refills | Status: DC | PRN

## 2018-10-16 NOTE — Patient Instructions (Signed)
Crush Injury of the Foot A crush injury of the foot happens when a great amount of force is suddenly applied to your foot. For example, this might happen if a heavy load falls on your foot. This injury can damage your skin and many parts (structures) in the foot and ankle joint. Treatment will depend on which parts of your foot and ankle joint are damaged and how severe your injury is. Follow these instructions at home: If you have a splint:  Wear the splint as told by your doctor. Remove it only as told by your doctor.  Loosen the splint if your toes tingle, get numb, or turn cold and blue.  Do not let your splint get wet if it is not waterproof.  Keep the splint clean. Wound care   If you have any skin wounds that were covered with bandages (dressings), follow instructions from your doctor about how to take care of your wounds. Make sure you: ? Wash your hands with soap and water before you change your bandage. If you cannot use soap and water, use hand sanitizer. ? Change your bandage as told by your doctor. ? Leave stitches (sutures), skin glue, or skin tape (adhesive) strips in place. They may need to stay in place for 2 weeks or longer. If tape strips get loose and curl up, you may trim the loose edges. Do not remove tape strips completely unless your doctor says it is okay.  If you have skin wounds, check them every day for signs of infection. Check for: ? More redness, swelling, or pain. ? More fluid or blood. ? Warmth. ? Pus or a bad smell. Managing pain, stiffness, and swelling  If directed, put ice on the injured area. ? Put ice in a plastic bag. ? Place a towel between your skin and the bag. ? Leave the ice on for 20 minutes, 2-3 times a day.  Raise (elevate) the injured area above the level of your heart while you are sitting or lying down. Driving  Do not drive or use heavy machinery while taking prescription pain medicine.  Ask your doctor when it is safe to drive  if you have a splint on your foot or leg. Activity  Return to your normal activities as told by your doctor. Ask your doctor what activities are safe for you.  Work with a physical therapist (PT) or occupational therapist (OT) as told by your doctor. General instructions  Do not put pressure on any part of the splint until it is fully hardened. This may take many hours.  If you have a splint and it is not waterproof, cover it with a watertight plastic bag when you take a bath or a shower.  Take over-the-counter and prescription medicines only as told by your doctor.  If you were prescribed an antibiotic, take it as told by your doctor. Do not stop taking the antibiotic before the prescription is done.  Do not use any tobacco products, such as cigarettes, chewing tobacco, and e-cigarettes. If you need help quitting, ask your doctor.  Keep all follow-up visits as told by your doctor. This is important. These include PT and OT visits. Contact a doctor if:  A wound with stitches opens up.  You have more redness, swelling, or pain in your foot.  You have more fluid or blood coming from your foot.  Your foot feels warm to the touch.  You have pus or a bad smell coming from your foot.  You  have a fever. Get help right away if:  You suddenly have very bad pain in your foot.  You had feeling (sensation) in your foot before but you suddenly lose feeling.  Your symptoms had gotten better and they suddenly get worse.  Your foot or toes are turning pink or blue. This information is not intended to replace advice given to you by your health care provider. Make sure you discuss any questions you have with your health care provider. Document Released: 08/04/2015 Document Revised: 01/29/2016 Document Reviewed: 04/16/2015 Elsevier Interactive Patient Education  2019 [employer].

## 2018-10-16 NOTE — Progress Notes (Signed)
Subjective:  Patient ID: Sydney [name], female    DOB: 01/01/1966  Age: 53 y.o. MRN: [id number]  CC: Foot Injury   HPI Sydney [name] presents for concerns about her right foot.  Dropped a piece of furniture on her first, second, and third digits about 2 days ago.  She complains of pain and swelling in the area.  Outpatient Medications Prior to Visit  Medication Sig Dispense Refill  . b complex vitamins capsule Take 1 capsule by mouth daily.    . Cyanocobalamin (B-12) 1000 MCG SUBL Place 2,000 mcg under the tongue daily. 30 each 3  . diazepam (VALIUM) 5 MG tablet Take 1 tablet (5 mg total) by mouth every 6 (six) hours as needed for anxiety. Pt takes prn for vertigo 30 tablet 0  . iron polysaccharides (NIFEREX) 150 MG capsule Take 1 capsule (150 mg total) by mouth daily.    . meclizine (ANTIVERT) 25 MG tablet Take 1 tablet (25 mg total) by mouth 3 (three) times daily as needed for dizziness. Taken prn 30 tablet 2  . Multiple Vitamin (MULTIVITAMIN) tablet Take 1 tablet by mouth 2 (two) times daily.    [name] scopolamine (TRANSDERM-SCOP, 1.5 MG,) 1 MG/3DAYS Place 1 patch (1.5 mg total) onto the skin every 3 (three) days. 4 patch 1  . Vitamin D, Ergocalciferol, (DRISDOL) 50000 units CAPS capsule Take 100,000 units 5/7 days, and 50,000 units 2/7 days. 150 capsule 3   No facility-administered medications prior to visit.     ROS Review of Systems  Musculoskeletal: Positive for arthralgias.  Skin: Negative for color change and wound.  Neurological: Negative for weakness.  All other systems reviewed and are negative.   Objective:  BP 118/70 (BP Location: Left Arm, Patient Position: Sitting, Cuff Size: Normal)   Pulse 76   Temp 97.9 F (36.6 C) (Oral)   Ht 5\' 11"  (1.803 m)   Wt 208 lb (94.3 kg)   SpO2 96%   BMI 29.01 kg/m   BP Readings from Last 3 Encounters:  10/16/18 118/70  12/16/17 108/80  10/28/17 99/71    Wt Readings from Last 3 Encounters:  10/16/18 208 lb (94.3 kg)    12/16/17 212 lb 0.6 oz (96.2 kg)  10/28/17 207 lb 12.8 oz (94.3 kg)    Physical Exam Musculoskeletal:     Right foot: Normal range of motion and normal capillary refill. Tenderness and bony tenderness present. No swelling, crepitus, deformity or laceration.     Comments: There is mild tenderness to palpation in the right first second and third toes but there is no ecchymosis, abrasions, wounds, or lacerations.     Lab Results  Component Value Date   WBC 9.2 10/28/2017   HGB 13.2 10/28/2017   HCT 40.9 10/28/2017   PLT 382 10/28/2017   GLUCOSE 49 (LL) 12/16/2017   CHOL 159 04/26/2017   TRIG 67.0 04/26/2017   HDL 51.60 04/26/2017   LDLCALC 94 04/26/2017   ALT 17 12/16/2017   AST 15 12/16/2017   NA 141 12/16/2017   K 3.4 (L) 12/16/2017   CL 107 12/16/2017   CREATININE 0.71 12/16/2017   BUN 10 12/16/2017   CO2 29 12/16/2017   TSH 0.71 04/26/2017   INR 1.20 03/07/2016    Mr Abdomen W Wo Contrast  Result Date: 01/23/2018 CLINICAL DATA:  Abdominal pain. Status post cholecystectomy, splenectomy and gastric bypass. Pancreatic cyst. EXAM: MRI ABDOMEN WITHOUT AND WITH CONTRAST TECHNIQUE: Multiplanar multisequence MR imaging of the abdomen was performed  both before and after the administration of intravenous contrast. CONTRAST:  13mL MULTIHANCE GADOBENATE DIMEGLUMINE 529 MG/ML IV SOLN COMPARISON:  03/08/2016 FINDINGS: Lower chest: No acute findings. Hepatobiliary: No focal liver abnormality. Previous cholecystectomy. No biliary dilatation. Pancreas: No pancreatic inflammation. No main duct dilatation identified. Small cystic lesion within head of pancreas is again noted and appears stable measuring 9 mm. Spleen:  Status post splenectomy. Adrenals/Urinary Tract: Normal appearance of the right adrenal gland. Left adrenal nodule measures 1.3 cm and is unchanged the from comparison exam, image 33/18. This has signal characteristics of a benign adenoma. No suspicious kidney mass or  hydronephrosis. 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 The visualized portions of the bowel loops within the abdomen are otherwise unremarkable. Vascular/Lymphatic: Normal appearance of the abdominal aorta. No enlarged lymph nodes identified. Other:  No free fluid or fluid collections. Musculoskeletal: No suspicious bone lesions identified. IMPRESSION: 1. 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 Indolent cystic neoplasm is felt to be less likely. Per consensus criteria, this warrants follow-up with pre and post-contrast abdominal MRI at 12 months intervals until 5 years of stability has been established. This recommendation follows ACR consensus guidelines: Management of Incidental Pancreatic Cysts: A White Paper of the ACR Incidental Findings Committee. J Am Coll Radiol 9169;[id number]. 2. Stable left adrenal gland adenoma. Electronically Signed   By: Kerby Moors M.D.   On: 01/23/2018 09:52    Dg Foot Complete Right  Result Date: 10/16/2018 CLINICAL DATA:  Heavy object fell on right foot. Pain and swelling. Initial encounter. EXAM: RIGHT FOOT COMPLETE - 3+ VIEW COMPARISON:  None. FINDINGS: Negative for fracture or dislocation. No focal soft tissue abnormality. Large plantar calcaneal spur. Calcaneal spurring along the insertion of the Achilles tendon. IMPRESSION: 1. No acute bone abnormality to the right foot. 2. Calcaneal spurring. Electronically Signed   By: Markus Daft M.D.   On: 10/16/2018 09:31    Assessment & Plan:   Sydney [name] was seen today for foot injury.  Diagnoses and all orders for this visit:  Foot trauma, right, initial encounter- Based on the injury, her symptoms, exam, and normal x-ray this is a contusion.  She was encouraged to rest, ice, elevate her right foot.  Will start ibuprofen as needed for the discomfort. -     DG Foot Complete Right; Future -     ibuprofen (ADVIL,MOTRIN) 600 MG tablet; Take 1  tablet (600 mg total) by mouth every 6 (six) hours as needed.   I am having Sydney [name]. Sydney [name] start on ibuprofen. I am also having her maintain her multivitamin, b complex vitamins, iron polysaccharides, Vitamin D (Ergocalciferol), B-12, diazepam, meclizine, and scopolamine.  Meds ordered this encounter  Medications  . ibuprofen (ADVIL,MOTRIN) 600 MG tablet    Sig: Take 1 tablet (600 mg total) by mouth every 6 (six) hours as needed.    Dispense:  30 tablet    Refill:  0     Follow-up: Return if symptoms worsen or fail to improve.  Scarlette Calico, MD

## 2018-10-18 ENCOUNTER — Encounter: Payer: Self-pay | Admitting: Internal Medicine

## 2018-10-26 DIAGNOSIS — Z1231 Encounter for screening mammogram for malignant neoplasm of breast: Secondary | ICD-10-CM | POA: Diagnosis not present

## 2018-10-26 DIAGNOSIS — Z01419 Encounter for gynecological examination (general) (routine) without abnormal findings: Secondary | ICD-10-CM | POA: Diagnosis not present

## 2018-10-26 DIAGNOSIS — Z6829 Body mass index (BMI) 29.0-29.9, adult: Secondary | ICD-10-CM | POA: Diagnosis not present

## 2018-10-26 DIAGNOSIS — E041 Nontoxic single thyroid nodule: Secondary | ICD-10-CM | POA: Diagnosis not present

## 2018-10-26 LAB — HM MAMMOGRAPHY: HM Mammogram: NORMAL (ref 0–4)

## 2018-10-26 LAB — HM PAP SMEAR

## 2018-10-27 ENCOUNTER — Other Ambulatory Visit: Payer: Self-pay | Admitting: Obstetrics and Gynecology

## 2018-10-27 DIAGNOSIS — E041 Nontoxic single thyroid nodule: Secondary | ICD-10-CM

## 2018-10-30 ENCOUNTER — Other Ambulatory Visit: Payer: BLUE CROSS/BLUE SHIELD

## 2018-11-03 ENCOUNTER — Ambulatory Visit
Admission: RE | Admit: 2018-11-03 | Discharge: 2018-11-03 | Disposition: A | Discharge status: Home / Self Care | Payer: BLUE CROSS/BLUE SHIELD | Source: Ambulatory Visit | Attending: Obstetrics and Gynecology | Admitting: Obstetrics and Gynecology

## 2018-11-03 DIAGNOSIS — E041 Nontoxic single thyroid nodule: Secondary | ICD-10-CM | POA: Diagnosis not present

## 2018-12-21 ENCOUNTER — Other Ambulatory Visit: Payer: Self-pay | Admitting: Hematology

## 2018-12-21 ENCOUNTER — Other Ambulatory Visit: Payer: Self-pay | Admitting: Family

## 2018-12-21 ENCOUNTER — Other Ambulatory Visit: Payer: Self-pay | Admitting: Internal Medicine

## 2018-12-21 DIAGNOSIS — H8113 Benign paroxysmal vertigo, bilateral: Secondary | ICD-10-CM

## 2018-12-21 MED ORDER — DIAZEPAM 5 MG PO TABS: 5.0000 mg | ORAL_TABLET | Freq: Four times a day (QID) | ORAL | 0 refills | Status: DC | PRN

## 2018-12-21 NOTE — Telephone Encounter (Signed)
Per database, rx was started on 12/16/2017 for #30 (a 7 day supply).

## 2018-12-21 NOTE — Telephone Encounter (Signed)
Last seen February 2019.  Please advise.

## 2018-12-21 NOTE — Telephone Encounter (Signed)
No lab or F/U since February 2019.  Please advise.

## 2018-12-25 MED ORDER — POLYSACCHARIDE IRON COMPLEX 150 MG PO CAPS: 150.0000 mg | ORAL_CAPSULE | Freq: Every day | ORAL | Status: DC

## 2019-01-12 ENCOUNTER — Encounter: Payer: Self-pay | Admitting: Internal Medicine

## 2019-01-12 ENCOUNTER — Ambulatory Visit (INDEPENDENT_AMBULATORY_CARE_PROVIDER_SITE_OTHER): Payer: BLUE CROSS/BLUE SHIELD | Admitting: Internal Medicine

## 2019-01-12 DIAGNOSIS — E559 Vitamin D deficiency, unspecified: Secondary | ICD-10-CM

## 2019-01-12 DIAGNOSIS — M85839 Other specified disorders of bone density and structure, unspecified forearm: Secondary | ICD-10-CM | POA: Diagnosis not present

## 2019-01-12 DIAGNOSIS — M810 Age-related osteoporosis without current pathological fracture: Secondary | ICD-10-CM | POA: Insufficient documentation

## 2019-01-12 DIAGNOSIS — E213 Hyperparathyroidism, unspecified: Secondary | ICD-10-CM | POA: Diagnosis not present

## 2019-01-12 DIAGNOSIS — M8580 Other specified disorders of bone density and structure, unspecified site: Secondary | ICD-10-CM | POA: Insufficient documentation

## 2019-01-12 MED ORDER — VITAMIN D (ERGOCALCIFEROL) 1.25 MG (50000 UNIT) PO CAPS: ORAL_CAPSULE | ORAL | 3 refills | Status: DC

## 2019-01-12 NOTE — Patient Instructions (Signed)
Please continue Ergocalciferol 100,000 units 5/7 days and 50,000 2/7 days.  Come back for labs in ~1 month  Please return in 1 year.

## 2019-01-12 NOTE — Progress Notes (Signed)
Patient ID: Sydney [name], female   DOB: [birth year], 53 y.o.   MRN: [id number]  Patient location: Home My location: Office  Referring Provider: Dr. Radene Knee  I connected with the patient on 01/12/19 at  9:25 AM EDT by a video enabled telemedicine application and verified that I am speaking with the correct person.   I discussed the limitations of evaluation and management by telemedicine and the availability of in person appointments. The patient expressed understanding and agreed to proceed.   Details of the encounter are shown below.  HPI  Sydney [name] is a 53 y.o.-year-old female, initially referred by Dr. Radene Knee, presenting for follow-up for secondary hyperparathyroidism and vitamin D deficiency and osteopenia diagnosed at last visit. Last visit 1 year ago  Reviewed and addended history: Pt had GBP in 01/2008 (sleeve gastrectomy trectomy) >> lost 168 lbs in 1.5 years. She started on MVI before meals (3x a day) after the surgery. As a part of the post gastric bypass annual evaluation, she had a CMP, vitamin D, PTH, other vitamin and mineral levels checked on 06/18/2013. Her PTH returned high, at 138.5,while calcium returned low normal at 8.4 (8.4-10.5). Of note, the patient does not have a previous history of hypercalcemia.  No fractures, no kidney stones.  We continue to have severe her vitamin D levels.  She was initially found to have a low level in 06/2013: 24, which was among the best levels that she had since her surgery  Reviewed pertinent labs: Lab Results  Component Value Date   VD25OH 18.60 (L) 12/30/2017   VD25OH 19.36 (L) 10/10/2017   VD25OH 19.63 (L) 04/26/2017   VD25OH 23.47 (L) 05/17/2016   VD25OH 22.76 (L) 01/01/2016   VD25OH 27 (L) 11/26/2013   Her high PTH is most likely due to malabsorption and vitamin D deficiency: Lab Results  Component Value Date   PTH 168 (H) 05/17/2016   PTH 201.7 (H) 11/26/2013   CALCIUM 8.2 (L) 12/16/2017   CALCIUM 8.8 10/28/2017    CALCIUM 8.1 (L) 04/26/2017   CALCIUM 9.1 12/31/2016   CALCIUM 8.6 09/13/2016   CALCIUM 8.3 (L) 05/17/2016   CALCIUM 8.8 05/17/2016   CALCIUM 9.2 03/08/2016   CALCIUM 8.2 (L) 03/07/2016   CALCIUM 8.4 (L) 01/28/2015   No history of CKD. Last BUN/Cr: Lab Results  Component Value Date   BUN 10 12/16/2017   CREATININE 0.71 12/16/2017   She is on home -1 multivitamin tablet 3 times a day -Ergocalciferol 100,000 units 5/7 days, 50,000 units 2/7 days - increased since last OV  Reviewed previous labs from Dr. Arvella Nigh, from 08/29/2015: - TSH 0.751 - PTH 131, associated calcium 8.5 (8.4-10.5) - Calcium 8.4 (8.6-10.2) - Phosphorus 3.7 (2.5-4.5) - Vitamin D 18 (30-100) - Pre-albumin 15 (17-34) - Albumin 3.4 (3.6-5.1) - Magnesium 1.8 - Vitamin B12 372 (211-911) - Vitamin A 15 (38-98) - Ferritin 7 (10-291) - CMP normal, except calcium and albumin The original report will be scanned.  Received results from Dr. Radene Knee, from 08/13/2014: - Calcium 8.9 (8.4-10.5) - PTH 91 (14-64) Calcium level has normalized, PTH is lagging behind. Continue high dose of vitamin D, ergocalciferol, as prescribed by Dr. Radene Knee.  Received labs from Dr. Arvella Nigh, from 06/25/2014: - TSH 0.483 - Calcium 8.1 (8.3-10.5) - Phosphorus 3.6 (2.3-4.6) - Vitamin D 24 (30-89) - Pre-albumin .9 (17-34) - Albumin 3.6 (3.5-5.2) - Magnesium 1.8 - Vitamin B12 294 (211-911) - Vitamin A 15 (38-98) - Ferritin 8 (10-291) - Lipid panel 144/52/43/91 -  CMP normal, except calcium The original report will be scanned.  06/18/2013: Calcium 8.4, PTH 138.5, phosphorus 3.9 (2.3-4.6), vit D 24  She had a small thyroid nodule in isthmus - palpated by Dr. Radene Knee >> Thyroid U/S (10/2018): 1.3 cm small isthmic nodule  She denies perioral numbness, acral cramping.    She has BPPV.  On meclizine.  She started to have hot flushes.  She continues to have occasional IV iron infusions.  She continues to work remotely >>  prev. 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  Now she is at home due to the coronavirus pandemic.  ROS: Constitutional: no weight gain/no weight loss, no fatigue, + hot flushes. No subjective hypothermia Eyes: no blurry vision, no xerophthalmia ENT: no sore throat, no nodules palpated in neck, no dysphagia, no odynophagia, no hoarseness Cardiovascular: no CP/no SOB/no palpitations/no leg swelling Respiratory: no cough/no SOB/no wheezing Gastrointestinal: no N/no V/no D/no C/no acid reflux Musculoskeletal: no muscle aches/no joint aches Skin: no rashes, no hair loss Neurological: no tremors/no numbness/no tingling/no dizziness  I reviewed pt's medications, allergies, PMH, social hx, family hx, and changes were documented in the history of present illness. Otherwise, unchanged from my initial visit note.  Past Medical History:  Diagnosis Date  . Abdominal pain, RLQ   . Right ovarian cyst    Past Surgical History:  Procedure Laterality Date  . CHOLECYSTECTOMY  1990  . CYSTOSCOPY  02/17/2012   Procedure: CYSTOSCOPY;  Surgeon: Darlyn Chamber, MD;  Location: [facility];  Service: Gynecology;;  . DIAGNOSTIC LAPAROSCOPY  1998   ENDOMETRIOSIS  . GANGLION CYST REMOVED  X3   LAST ONE 1989   LEFT WRIST  . LIPOMA REMOVED  2008   RIGHT PALM  . MINI GASTRIC BYPASS  2009  . NASAL SINUS SURGERY  X3    LAST ONE 1996  . ORIF RIGHT ELBOW FX  2004  . PUBOVAGINAL SLING  2001  . 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 TONSILLECTOMY  1975  . VAGINAL HYSTERECTOMY  1999   Social History   Socioeconomic History  . Marital status: Married    Spouse name: Not on file  . Number of children: Not on file  . Years of education: Not on file  . Highest education level: Not on file  Occupational History  . Not on file  Social Needs  . Financial resource strain: Not on file  . Food insecurity:    Worry: Not on file    Inability: Not on file   . Transportation needs:    Medical: Not on file    Non-medical: Not on file  Tobacco Use  . Smoking status: Never Smoker  . Smokeless tobacco: Never Used  Substance and Sexual Activity  . Alcohol use: Yes    Comment: OCCASIONAL  . Drug use: No  . Sexual activity: Yes    Partners: Male  Lifestyle  . Physical activity:    Days per week: Not on file    Minutes per session: Not on file  . Stress: Not on file  Relationships  . Social connections:    Talks on phone: Not on file    Gets together: Not on file    Attends religious service: Not on file    Active member of club or organization: Not on file    Attends meetings of clubs or organizations: Not on file    Relationship status:  Not on file  . Intimate partner violence:    Fear of current or ex partner: Not on file    Emotionally abused: Not on file    Physically abused: Not on file    Forced sexual activity: Not on file  Other Topics Concern  . Not on file  Social History Narrative   Regular exercise: yes, walk 20 min daily   Caffeine use: 1 cup of coffee daily   Current Outpatient Medications on File Prior to Visit  Medication Sig Dispense Refill  . b complex vitamins capsule Take 1 capsule by mouth daily.    . Cyanocobalamin (B-12) 1000 MCG SUBL Place 2,000 mcg under the tongue daily. 30 each 3  . diazepam (VALIUM) 5 MG tablet Take 1 tablet (5 mg total) by mouth every 6 (six) hours as needed for anxiety. Pt takes prn for vertigo 30 tablet 0  . ibuprofen (ADVIL,MOTRIN) 600 MG tablet Take 1 tablet (600 mg total) by mouth every 6 (six) hours as needed. 30 tablet 0  . iron polysaccharides (NIFEREX) 150 MG capsule Take 1 capsule (150 mg total) by mouth daily.    . meclizine (ANTIVERT) 25 MG tablet Take 1 tablet (25 mg total) by mouth 3 (three) times daily as needed for dizziness. Taken prn 30 tablet 2  . Multiple Vitamin (MULTIVITAMIN) tablet Take 1 tablet by mouth 2 (two) times daily.    . Vitamin D, Ergocalciferol,  (DRISDOL) 50000 units CAPS capsule Take 100,000 units 5/7 days, and 50,000 units 2/7 days. 150 capsule 3   No current facility-administered medications on file prior to visit.    Allergies  Allergen Reactions  . Codeine Hives   Family History  Problem Relation Age of Onset  . Heart failure Mother   . Heart failure Father   . Hypertension Father   . Diabetes Father   . Cancer Other     PE: There were no vitals taken for this visit.  Wt Readings from Last 3 Encounters:  10/16/18 208 lb (94.3 kg)  12/16/17 212 lb 0.6 oz (96.2 kg)  10/28/17 207 lb 12.8 oz (94.3 kg)   Constitutional:  in NAD  The physical exam was not performed (virtual visit).  Assessment: 1. 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 Vitamin D deficiency  3.  Osteopenia  4. Thyroid cyst  Plan: 1. Hyperparathyroidism -Patient with a history of sleeve gastrectomy in 2009, with subsequent malabsorption, whom I initially saw in 2014 for her hyperparathyroidism (we diagnosed this that secondary to her vitamin D deficiency).  She has a low vitamin D despite our efforts to supplement this vitamin.  Therefore, her GI calcium absorption is impaired, causing secondary hyperparathyroidism.  The treatment for this is normalizing her vitamin D levels. -Of note, her calcium levels have been mostly normal, with occasional low levels -Bone density at last visit showed osteopenia at the level of the radius, which could be contributed by her elevated PTH -We will recheck her PTH again after we normalize her vitamin D - I will see her back in 1 year  2.  Vitamin D deficiency.   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  Osteopenia -Reviewed scan obtained after last visit: Her T score was lower at the level of the radius -For now we will continue to follow this trying to bring her vitamin D levels are normal -We will repeat her DXA scan in 2 years  4. Thyroid cyst - new - small, isthmic - no neck compression sxs - will follow this clinically for now  Needs refills.   Philemon Kingdom, MD PhD [facility]

## 2019-02-26 ENCOUNTER — Other Ambulatory Visit: Payer: Self-pay

## 2019-02-26 ENCOUNTER — Encounter: Payer: Self-pay | Admitting: Internal Medicine

## 2019-02-26 ENCOUNTER — Ambulatory Visit (INDEPENDENT_AMBULATORY_CARE_PROVIDER_SITE_OTHER): Payer: BC Managed Care – PPO | Admitting: Internal Medicine

## 2019-02-26 VITALS — BP 108/74 | HR 77 | Temp 98.0°F | Resp 16 | Ht 71.0 in | Wt 202.0 lb

## 2019-02-26 DIAGNOSIS — L563 Solar urticaria: Secondary | ICD-10-CM | POA: Diagnosis not present

## 2019-02-26 MED ORDER — LEVOCETIRIZINE DIHYDROCHLORIDE 5 MG PO TABS: 10.0000 mg | ORAL_TABLET | Freq: Every evening | ORAL | 0 refills | Status: DC

## 2019-02-26 MED ORDER — MONTELUKAST SODIUM 10 MG PO TABS: 10.0000 mg | ORAL_TABLET | Freq: Every day | ORAL | 0 refills | Status: DC

## 2019-02-26 NOTE — Patient Instructions (Signed)
Sun Sensitivity Sun sensitivity, also called photosensitivity, is a condition in which exposure to sunlight causes skin irritation. There are many types of sun sensitivity. Some medicines, skin products, and medical conditions can cause sun sensitivity that can range from mild to severe. If medicines or products are causing sun sensitivity, the condition may go away after you stop taking the medicine or using the product. In some cases, sun sensitivity is a long-lasting (chronic) condition. What are the causes? Common causes of sun sensitivity include:  Certain medicines.  Certain makeup products (cosmetics), lotions, or medicines that are applied to the skin.  Some health conditions, including: ? Connective tissue diseases, such as lupus. ? Redness and inflammation of the face or eyes (rosacea). ? White patches of skin (vitiligo). In some cases, sun sensitivity may be passed along from parent to child (inherited), or the cause may not be known. What increases the risk? This condition is more likely to develop in people who:  Have family members who have sun sensitivity.  Are in sunlight for long periods of time. What are the signs or symptoms? Symptoms of sun sensitivity vary. Common symptoms include:  Red (sunburned) skin that may be bumpy or form blisters.  Hives.  Itchy skin (pruritus).  Pain where skin is exposed to the sun.  Dry, flaky skin.  Swelling of the skin. Often with this condition, sunburn or other symptoms develop after a short amount of sun exposure. How is this diagnosed? This condition is diagnosed based on your symptoms. It is important to talk with your health care provider about your symptoms, including:  The timing of your symptoms, such as whether you have symptoms immediately after being in the sun, or after a long period of being in the sun.  How long your symptoms last.  Where your symptoms occur on your face or body. Some types of sun  sensitivity can be diagnosed with:  A skin biopsy. This a test that involves taking a small sample of skin to be examined.  A blood test to check for certain medical conditions or genetic factors.  Light testing to see how you react to UV (ultraviolet) light, which is the type of light that comes from the sun. How is this treated? This condition is often treated by preventing known causes of sun sensitivity. This may include protecting your skin from the sun and avoiding certain skin products. Your health care provider may recommend that you take an antihistamine medicine or apply prescription cream to affected areas to help relieve symptoms. Follow these instructions at home:  Do not break any blisters that you have.  Do not scratch your skin while you have symptoms. Doing that can make symptoms worse.  Apply a cool cloth to affected areas. This may help to relieve symptoms. Do not apply ice to your affected skin. Icing can cause further damage.  Take over-the-counter and prescription medicines only as told by your health care provider. How is this prevented?   Avoid skin products that cause symptoms, such as cosmetics and lotions.  Avoid being in the sun while taking medicines that are known to cause sun sensitivity. Ask your health care provider if any medicines that you take may cause sun sensitivity.  Avoid the sun when it is strongest, usually between 10 a.m. and 4 p.m.  Cover your skin with pants, long sleeves, and a hat when you are exposed to sunlight.  Apply sunscreen to dry skin 15 or more minutes before you go outside,  even on cloudy days. ? Use a sunscreen with an SPF of 15 or higher. Consider using an SPF of 30 or higher if you will be exposed to the sun for long periods of time. ? Use a waterproof or water-resistant sunscreen that protects against all of the sun's rays (broad-spectrum). ? Apply a thick coat of sunscreen over all exposed skin. Typically, a palm-size  amount of sunscreen will cover your whole body. ? Do not use sunscreen on a baby who is younger than 40 months of age.  Reapply sunscreen: ? About every 2 hours, whether it is sunny or cloudy. Reapply more often if you are sweating a lot. ? After swimming or playing in the water. Contact a health care provider if:  Your symptoms do not improve with treatment.  Your rash does not go away.  Your pain or itching is not controlled with medicine.  Your skin becomes more painful and swollen.  You have a fever.  You have open blisters. Get help right away if:  You have bleeding underneath skin that was exposed to the sun.  You have pus or fluid coming from any blisters.  You suddenly develop a rash as well as: ? Swelling around your eyes or lips. ? Trouble swallowing. ? Trouble breathing. Summary  Sun sensitivity, also called photosensitivity, is a condition in which exposure to sunlight causes skin irritation.  Some medicines, skin products, and medical conditions can cause sun sensitivity.  Avoid being in the sun while taking medicines that are known to cause sun sensitivity.  Apply sunscreen to dry skin 15 or more minutes before you go outside, even on cloudy days. Reapply sunscreen as directed. This information is not intended to replace advice given to you by your health care provider. Make sure you discuss any questions you have with your health care provider. Document Released: 02/07/2017 Document Revised: 02/07/2017 Document Reviewed: 02/07/2017 Elsevier Interactive Patient Education  2019 [employer].

## 2019-02-26 NOTE — Progress Notes (Signed)
Subjective:  Patient ID: Sydney [name], female    DOB: [birth year]  Age: 53 y.o. MRN: [id number]  CC: Rash   HPI Sydney [name] presents for concerns about a 10-day history of itchy rash.  She went to the beach about 2 weeks ago and got tanned.  Now for the last 10 days she has had an itchy, whelping rash that comes and goes from different parts of her body including her face, chin, neck, torso, fingers, and arms.  She saw another provider about 7 days ago and took a course of prednisone which did not help much.  She takes Benadryl at bedtime which controls the symptoms and allows her to sleep for 6 hours but the symptoms then recur during the day.  Outpatient Medications Prior to Visit  Medication Sig Dispense Refill   b complex vitamins capsule Take 1 capsule by mouth daily.     Cyanocobalamin (B-12) 1000 MCG SUBL Place 2,000 mcg under the tongue daily. 30 each 3   diazepam (VALIUM) 5 MG tablet Take 1 tablet (5 mg total) by mouth every 6 (six) hours as needed for anxiety. Pt takes prn for vertigo 30 tablet 0   iron polysaccharides (NIFEREX) 150 MG capsule Take 1 capsule (150 mg total) by mouth daily.     Multiple Vitamin (MULTIVITAMIN) tablet Take 1 tablet by mouth 2 (two) times daily.     Vitamin D, Ergocalciferol, (DRISDOL) 1.25 MG (50000 UT) CAPS capsule Take 100,000 units 5/7 days, and 50,000 units 2/7 days. 100 capsule 3   ibuprofen (ADVIL,MOTRIN) 600 MG tablet Take 1 tablet (600 mg total) by mouth every 6 (six) hours as needed. 30 tablet 0   meclizine (ANTIVERT) 25 MG tablet Take 1 tablet (25 mg total) by mouth 3 (three) times daily as needed for dizziness. Taken prn 30 tablet 2   No facility-administered medications prior to visit.     ROS Review of Systems  Constitutional: Negative for chills, diaphoresis, fatigue and fever.  HENT: Negative.  Negative for sore throat.   Respiratory: Negative for cough, chest tightness, shortness of breath and wheezing.     Cardiovascular: Negative for chest pain, palpitations and leg swelling.  Gastrointestinal: Negative for abdominal pain, diarrhea, nausea and vomiting.  Endocrine: Negative.   Genitourinary: Negative.   Musculoskeletal: Negative.   Skin: Positive for rash. Negative for color change.  Neurological: Negative.  Negative for dizziness and weakness.  Hematological: Negative for adenopathy. Does not bruise/bleed easily.  Psychiatric/Behavioral: Negative.     Objective:  BP 108/74 (BP Location: Left Arm, Patient Position: Sitting, Cuff Size: Normal)    Pulse 77    Temp 98 F (36.7 C) (Oral)    Resp 16    Ht 5\' 11"  (1.803 m)    Wt 202 lb (91.6 kg)    SpO2 96%    BMI 28.17 kg/m   BP Readings from Last 3 Encounters:  02/26/19 108/74  10/16/18 118/70  12/16/17 108/80    Wt Readings from Last 3 Encounters:  02/26/19 202 lb (91.6 kg)  10/16/18 208 lb (94.3 kg)  12/16/17 212 lb 0.6 oz (96.2 kg)    Physical Exam Vitals signs reviewed.  Constitutional:      General: She is not in acute distress.    Appearance: She is not ill-appearing, toxic-appearing or diaphoretic.  HENT:     Nose: Nose normal. No congestion or rhinorrhea.     Mouth/Throat:     Mouth: Mucous membranes are moist.  Pharynx: No oropharyngeal exudate or posterior oropharyngeal erythema.  Eyes:     Conjunctiva/sclera: Conjunctivae normal.  Neck:     Musculoskeletal: Normal range of motion. No neck rigidity.  Cardiovascular:     Rate and Rhythm: Normal rate and regular rhythm.     Heart sounds: No murmur. No gallop.   Pulmonary:     Effort: Pulmonary effort is normal.     Breath sounds: No stridor. No wheezing, rhonchi or rales.  Abdominal:     General: Abdomen is protuberant. There is no distension.     Palpations: Abdomen is soft. There is no hepatomegaly, splenomegaly or mass.     Tenderness: There is no abdominal tenderness.  Lymphadenopathy:     Cervical: No cervical adenopathy.  Skin:    General: Skin is  warm.     Findings: Erythema and rash present. No ecchymosis. Rash is urticarial. Rash is not macular, nodular or vesicular.     Comments: There are blanching urticarial wheals on the inner surface of both upper extremities bilaterally.  There are similar lesions on the torso, buttocks, thighs, and fingers.  There is no involvement of the face or mucous membranes.  There are no targets, vesicles, or pustules.  The background skin shows xerosis and excessive tanning.  Neurological:     General: No focal deficit present.  Psychiatric:        Mood and Affect: Mood normal.     Lab Results  Component Value Date   WBC 9.2 10/28/2017   HGB 13.2 10/28/2017   HCT 40.9 10/28/2017   PLT 382 10/28/2017   GLUCOSE 49 (LL) 12/16/2017   CHOL 159 04/26/2017   TRIG 67.0 04/26/2017   HDL 51.60 04/26/2017   LDLCALC 94 04/26/2017   ALT 17 12/16/2017   AST 15 12/16/2017   NA 141 12/16/2017   K 3.4 (L) 12/16/2017   CL 107 12/16/2017   CREATININE 0.71 12/16/2017   BUN 10 12/16/2017   CO2 29 12/16/2017   TSH 0.71 04/26/2017   INR 1.20 03/07/2016    US Thyroid  Result Date: 11/03/2018 CLINICAL DATA:  Palpable abnormality. Nodule palpable by physical exam. EXAM: THYROID ULTRASOUND TECHNIQUE: Ultrasound examination of the thyroid gland and adjacent soft tissues was performed. COMPARISON:  None. FINDINGS: Parenchymal Echotexture: Normal Isthmus: 1.1 cm Right lobe: 4.5 x 1.7 x 1.0 cm Left lobe: 5.8 x 1.3 x 1.3 cm _________________________________________________________ Estimated total number of nodules >/= 1 cm: 1 Number of spongiform nodules >/=  2 cm not described below (TR1): 0 Number of mixed cystic and solid nodules >/= 1.5 cm not described below (TR2): 0 _________________________________________________________ 1.3 x 1.1 x 1.1 cm simple cyst in the isthmus does not meet criteria for biopsy nor follow-up. Other small nodules measure 0.4 cm or less in size. IMPRESSION: There are no nodules which meet  criteria for biopsy nor follow-up There is a 1.3 cm simple cyst in the isthmus which may correspond to the palpable abnormality. The above is in keeping with the ACR TI-RADS recommendations - J Am Coll Radiol 2017;14:587-595. Electronically Signed   By: Marybelle Killings M.D.   On: 11/03/2018 15:03    Assessment & Plan:   Ortencia was seen today for rash.  Diagnoses and all orders for this visit:  Urticaria, solar -     montelukast (SINGULAIR) 10 MG tablet; Take 1 tablet (10 mg total) by mouth at bedtime. -     levocetirizine (XYZAL) 5 MG tablet; Take 2 tablets (10 mg  total) by mouth every evening.   I have discontinued Jearlean Demauro. Mcclanahan's meclizine and ibuprofen. I am also having her start on montelukast and levocetirizine. Additionally, I am having her maintain her multivitamin, b complex vitamins, B-12, iron polysaccharides, diazepam, and Vitamin D (Ergocalciferol).  Meds ordered this encounter  Medications   montelukast (SINGULAIR) 10 MG tablet    Sig: Take 1 tablet (10 mg total) by mouth at bedtime.    Dispense:  90 tablet    Refill:  0   levocetirizine (XYZAL) 5 MG tablet    Sig: Take 2 tablets (10 mg total) by mouth every evening.    Dispense:  180 tablet    Refill:  0     Follow-up: Return in about 3 weeks (around 03/19/2019).  Scarlette Calico, MD

## 2019-04-17 ENCOUNTER — Encounter: Payer: Self-pay | Admitting: Internal Medicine

## 2019-04-18 ENCOUNTER — Other Ambulatory Visit: Payer: Self-pay | Admitting: Internal Medicine

## 2019-04-18 DIAGNOSIS — L237 Allergic contact dermatitis due to plants, except food: Secondary | ICD-10-CM | POA: Insufficient documentation

## 2019-04-18 MED ORDER — CLOBETASOL PROPIONATE 0.05 % EX OINT: 1.0000 "application " | TOPICAL_OINTMENT | Freq: Two times a day (BID) | CUTANEOUS | 0 refills | Status: DC

## 2019-05-24 ENCOUNTER — Other Ambulatory Visit: Payer: Self-pay | Admitting: Internal Medicine

## 2019-05-24 DIAGNOSIS — L563 Solar urticaria: Secondary | ICD-10-CM

## 2019-05-24 MED ORDER — MONTELUKAST SODIUM 10 MG PO TABS: 10.0000 mg | ORAL_TABLET | Freq: Every day | ORAL | 0 refills | Status: DC

## 2019-05-24 MED ORDER — LEVOCETIRIZINE DIHYDROCHLORIDE 5 MG PO TABS: 10.0000 mg | ORAL_TABLET | Freq: Every evening | ORAL | 0 refills | Status: DC

## 2019-10-23 ENCOUNTER — Ambulatory Visit (INDEPENDENT_AMBULATORY_CARE_PROVIDER_SITE_OTHER): Payer: BC Managed Care – PPO | Admitting: Internal Medicine

## 2019-10-23 ENCOUNTER — Other Ambulatory Visit: Payer: Self-pay

## 2019-10-23 ENCOUNTER — Encounter: Payer: Self-pay | Admitting: Internal Medicine

## 2019-10-23 VITALS — BP 112/76 | HR 65 | Temp 98.6°F | Ht 71.0 in | Wt 217.0 lb

## 2019-10-23 DIAGNOSIS — H8113 Benign paroxysmal vertigo, bilateral: Secondary | ICD-10-CM

## 2019-10-23 DIAGNOSIS — D508 Other iron deficiency anemias: Secondary | ICD-10-CM

## 2019-10-23 DIAGNOSIS — Z23 Encounter for immunization: Secondary | ICD-10-CM

## 2019-10-23 DIAGNOSIS — E213 Hyperparathyroidism, unspecified: Secondary | ICD-10-CM | POA: Diagnosis not present

## 2019-10-23 DIAGNOSIS — E519 Thiamine deficiency, unspecified: Secondary | ICD-10-CM | POA: Diagnosis not present

## 2019-10-23 DIAGNOSIS — Z0000 Encounter for general adult medical examination without abnormal findings: Secondary | ICD-10-CM

## 2019-10-23 DIAGNOSIS — Z1211 Encounter for screening for malignant neoplasm of colon: Secondary | ICD-10-CM

## 2019-10-23 DIAGNOSIS — D539 Nutritional anemia, unspecified: Secondary | ICD-10-CM | POA: Diagnosis not present

## 2019-10-23 DIAGNOSIS — Z9884 Bariatric surgery status: Secondary | ICD-10-CM

## 2019-10-23 DIAGNOSIS — E559 Vitamin D deficiency, unspecified: Secondary | ICD-10-CM | POA: Diagnosis not present

## 2019-10-23 DIAGNOSIS — E538 Deficiency of other specified B group vitamins: Secondary | ICD-10-CM

## 2019-10-23 MED ORDER — DIAZEPAM 5 MG PO TABS: 5.0000 mg | ORAL_TABLET | Freq: Four times a day (QID) | ORAL | 1 refills | Status: DC | PRN

## 2019-10-23 MED ORDER — MECLIZINE HCL 25 MG PO TABS: 25.0000 mg | ORAL_TABLET | Freq: Three times a day (TID) | ORAL | 2 refills | Status: DC | PRN

## 2019-10-23 NOTE — Patient Instructions (Signed)
Health Maintenance, Female Adopting a healthy lifestyle and getting preventive care are important in promoting health and wellness. Ask your health care provider about:  The right schedule for you to have regular tests and exams.  Things you can do on your own to prevent diseases and keep yourself healthy. What should I know about diet, weight, and exercise? Eat a healthy diet   Eat a diet that includes plenty of vegetables, fruits, low-fat dairy products, and lean protein.  Do not eat a lot of foods that are high in solid fats, added sugars, or sodium. Maintain a healthy weight Body mass index (BMI) is used to identify weight problems. It estimates body fat based on height and weight. Your health care provider can help determine your BMI and help you achieve or maintain a healthy weight. Get regular exercise Get regular exercise. This is one of the most important things you can do for your health. Most adults should:  Exercise for at least 150 minutes each week. The exercise should increase your heart rate and make you sweat (moderate-intensity exercise).  Do strengthening exercises at least twice a week. This is in addition to the moderate-intensity exercise.  Spend less time sitting. Even light physical activity can be beneficial. Watch cholesterol and blood lipids Have your blood tested for lipids and cholesterol at 54 years of age, then have this test every 5 years. Have your cholesterol levels checked more often if:  Your lipid or cholesterol levels are high.  You are older than 53 years of age.  You are at high risk for heart disease. What should I know about cancer screening? Depending on your health history and family history, you may need to have cancer screening at various ages. This may include screening for:  Breast cancer.  Cervical cancer.  Colorectal cancer.  Skin cancer.  Lung cancer. What should I know about heart disease, diabetes, and high blood  pressure? Blood pressure and heart disease  High blood pressure causes heart disease and increases the risk of stroke. This is more likely to develop in people who have high blood pressure readings, are of African descent, or are overweight.  Have your blood pressure checked: ? Every 3-5 years if you are 18-39 years of age. ? Every year if you are 40 years old or older. Diabetes Have regular diabetes screenings. This checks your fasting blood sugar level. Have the screening done:  Once every three years after age 40 if you are at a normal weight and have a low risk for diabetes.  More often and at a younger age if you are overweight or have a high risk for diabetes. What should I know about preventing infection? Hepatitis B If you have a higher risk for hepatitis B, you should be screened for this virus. Talk with your health care provider to find out if you are at risk for hepatitis B infection. Hepatitis C Testing is recommended for:  Everyone born from 1945 through 1965.  Anyone with known risk factors for hepatitis C. Sexually transmitted infections (STIs)  Get screened for STIs, including gonorrhea and chlamydia, if: ? You are sexually active and are younger than 53 years of age. ? You are older than 53 years of age and your health care provider tells you that you are at risk for this type of infection. ? Your sexual activity has changed since you were last screened, and you are at increased risk for chlamydia or gonorrhea. Ask your health care provider if   you are at risk.  Ask your health care provider about whether you are at high risk for HIV. Your health care provider may recommend a prescription medicine to help prevent HIV infection. If you choose to take medicine to prevent HIV, you should first get tested for HIV. You should then be tested every 3 months for as long as you are taking the medicine. Pregnancy  If you are about to stop having your period (premenopausal) and  you may become pregnant, seek counseling before you get pregnant.  Take 400 to 800 micrograms (mcg) of folic acid every day if you become pregnant.  Ask for birth control (contraception) if you want to prevent pregnancy. Osteoporosis and menopause Osteoporosis is a disease in which the bones lose minerals and strength with aging. This can result in bone fractures. If you are 65 years old or older, or if you are at risk for osteoporosis and fractures, ask your health care provider if you should:  Be screened for bone loss.  Take a calcium or vitamin D supplement to lower your risk of fractures.  Be given hormone replacement therapy (HRT) to treat symptoms of menopause. Follow these instructions at home: Lifestyle  Do not use any products that contain nicotine or tobacco, such as cigarettes, e-cigarettes, and chewing tobacco. If you need help quitting, ask your health care provider.  Do not use street drugs.  Do not share needles.  Ask your health care provider for help if you need support or information about quitting drugs. Alcohol use  Do not drink alcohol if: ? Your health care provider tells you not to drink. ? You are pregnant, may be pregnant, or are planning to become pregnant.  If you drink alcohol: ? Limit how much you use to 0-1 drink a day. ? Limit intake if you are breastfeeding.  Be aware of how much alcohol is in your drink. In the [place] one drink equals one 12 oz bottle of beer (355 mL), one 5 oz glass of wine (148 mL), or one 1 oz glass of hard liquor (44 mL). General instructions  Schedule regular health, dental, and eye exams.  Stay current with your vaccines.  Tell your health care provider if: ? You often feel depressed. ? You have ever been abused or do not feel safe at home. Summary  Adopting a healthy lifestyle and getting preventive care are important in promoting health and wellness.  Follow your health care provider's instructions about healthy  diet, exercising, and getting tested or screened for diseases.  Follow your health care provider's instructions on monitoring your cholesterol and blood pressure. This information is not intended to replace advice given to you by your health care provider. Make sure you discuss any questions you have with your health care provider. Document Revised: 08/16/2018 Document Reviewed: 08/16/2018 Elsevier Patient Education  2020 [employer]  

## 2019-10-23 NOTE — Progress Notes (Signed)
Subjective:  Patient ID: Sydney [name], female    DOB: Jul 07, 1966  Age: 54 y.o. MRN: [id number]  CC: Annual Exam and Anemia  This visit occurred during the SARS-CoV-2 public health emergency.  Safety protocols were in place, including screening questions prior to the visit, additional usage of staff PPE, and extensive cleaning of exam room while observing appropriate contact time as indicated for disinfecting solutions.    HPI Sydney [name] presents for a CPX.  Since I last saw her she tells me she feels like she has developed menopausal symptoms.  She complains of hot flashes, fatigue, and weight gain.  She has an upcoming appointment with her gynecologist to discuss diagnosis and treatment options.  She is taking a multivitamin 3 times a day and high-dose vitamin D supplement.  She denies any recent episodes of shortness of breath, palpitations, edema, or changes in her bowel habits.  She has mild, intermittent, recurrent episodes of vertigo.  She requests a refill on prescriptions for meclizine and diazepam.   Outpatient Medications Prior to Visit  Medication Sig Dispense Refill  . b complex vitamins capsule Take 1 capsule by mouth daily.    . Cyanocobalamin (B-12) 1000 MCG SUBL Place 2,000 mcg under the tongue daily. 30 each 3  . iron polysaccharides (NIFEREX) 150 MG capsule Take 1 capsule (150 mg total) by mouth daily.    [name] levocetirizine (XYZAL) 5 MG tablet Take 2 tablets (10 mg total) by mouth every evening. 180 tablet 0  . montelukast (SINGULAIR) 10 MG tablet Take 1 tablet (10 mg total) by mouth at bedtime. 90 tablet 0  . Multiple Vitamin (MULTIVITAMIN) tablet Take 1 tablet by mouth 2 (two) times daily.    . Vitamin D, Ergocalciferol, (DRISDOL) 1.25 MG (50000 UT) CAPS capsule Take 100,000 units 5/7 days, and 50,000 units 2/7 days. 100 capsule 3  . diazepam (VALIUM) 5 MG tablet Take 1 tablet (5 mg total) by mouth every 6 (six) hours as needed for anxiety. Pt takes prn for  vertigo 30 tablet 0  . clobetasol ointment (TEMOVATE) [id number] % Apply 1 application topically 2 (two) times daily. 45 g 0   No facility-administered medications prior to visit.    ROS Review of Systems  Constitutional: Positive for fatigue and unexpected weight change. Negative for appetite change, chills, diaphoresis and fever.  HENT: Negative.   Eyes: Negative.   Respiratory: Negative for cough, chest tightness, shortness of breath and wheezing.   Cardiovascular: Negative for chest pain, palpitations and leg swelling.  Gastrointestinal: Negative for abdominal pain, blood in stool, constipation, diarrhea, nausea and vomiting.  Endocrine: Negative.   Genitourinary: Negative.  Negative for difficulty urinating.  Musculoskeletal: Negative.   Skin: Negative.  Negative for pallor.  Neurological: Negative.  Negative for dizziness, seizures, weakness, light-headedness, numbness and headaches.  Hematological: Negative for adenopathy. Does not bruise/bleed easily.  Psychiatric/Behavioral: Negative.     Objective:  BP 112/76 (BP Location: Left Arm, Patient Position: Sitting, Cuff Size: Large)   Pulse 65   Temp 98.6 F (37 C) (Oral)   Ht 5\' 11"  (1.803 m)   Wt 217 lb (98.4 kg)   SpO2 95%   BMI 30.27 kg/m   BP Readings from Last 3 Encounters:  10/23/19 112/76  02/26/19 108/74  10/16/18 118/70    Wt Readings from Last 3 Encounters:  10/23/19 217 lb (98.4 kg)  02/26/19 202 lb (91.6 kg)  10/16/18 208 lb (94.3 kg)    Physical Exam Vitals reviewed.  Constitutional:      Appearance: Normal appearance.  HENT:     Nose: Nose normal.     Mouth/Throat:     Mouth: Mucous membranes are moist.  Eyes:     General: No scleral icterus.    Conjunctiva/sclera: Conjunctivae normal.  Cardiovascular:     Rate and Rhythm: Normal rate and regular rhythm.     Heart sounds: No murmur.  Pulmonary:     Effort: Pulmonary effort is normal.     Breath sounds: No stridor. No wheezing, rhonchi or  rales.  Abdominal:     General: Abdomen is flat. Bowel sounds are normal. There is no distension.     Palpations: Abdomen is soft. There is no hepatomegaly, splenomegaly or mass.     Tenderness: There is no abdominal tenderness. There is no guarding.     Hernia: No hernia is present.  Musculoskeletal:        General: Normal range of motion.     Cervical back: Neck supple.     Right lower leg: No edema.     Left lower leg: No edema.  Lymphadenopathy:     Cervical: No cervical adenopathy.  Skin:    General: Skin is warm and dry.     Coloration: Skin is not pale.  Neurological:     General: No focal deficit present.     Mental Status: She is alert. Mental status is at baseline.  Psychiatric:        Mood and Affect: Mood normal.        Behavior: Behavior normal.     Lab Results  Component Value Date   WBC 11.0 (H) 10/23/2019   HGB 13.7 10/23/2019   HCT 41.6 10/23/2019   PLT 435.0 (H) 10/23/2019   GLUCOSE 97 10/23/2019   CHOL 175 10/23/2019   TRIG 73.0 10/23/2019   HDL 51.50 10/23/2019   LDLCALC 109 (H) 10/23/2019   ALT 17 12/16/2017   AST 15 12/16/2017   NA 139 10/23/2019   K 4.5 10/23/2019   CL 105 10/23/2019   CREATININE 0.77 10/23/2019   BUN 18 10/23/2019   CO2 28 10/23/2019   TSH 1.44 10/23/2019   INR 1.20 03/07/2016    US THYROID  Result Date: 11/03/2018 CLINICAL DATA:  Palpable abnormality. Nodule palpable by physical exam. EXAM: THYROID ULTRASOUND TECHNIQUE: Ultrasound examination of the thyroid gland and adjacent soft tissues was performed. COMPARISON:  None. FINDINGS: Parenchymal Echotexture: Normal Isthmus: 1.1 cm Right lobe: 4.5 x 1.7 x 1.0 cm Left lobe: 5.8 x 1.3 x 1.3 cm _________________________________________________________ Estimated total number of nodules >/= 1 cm: 1 Number of spongiform nodules >/=  2 cm not described below (TR1): 0 Number of mixed cystic and solid nodules >/= 1.5 cm not described below (TR2): 0  _________________________________________________________ 1.3 x 1.1 x 1.1 cm simple cyst in the isthmus does not meet criteria for biopsy nor follow-up. Other small nodules measure 0.4 cm or less in size. IMPRESSION: There are no nodules which meet criteria for biopsy nor follow-up There is a 1.3 cm simple cyst in the isthmus which may correspond to the palpable abnormality. The above is in keeping with the ACR TI-RADS recommendations - J Am Coll Radiol 2017;14:587-595. Electronically Signed   By: Marybelle Killings M.D.   On: 11/03/2018 15:03    Assessment & Plan:   Karis was seen today for annual exam and anemia.  Diagnoses and all orders for this visit:  Hyperparathyroidism (Gas)- Her vitamin D level and  calcium level are normal now.  I will monitor her PTH level. -     TSH -     PTH, intact and calcium -     Basic metabolic panel  Bariatric surgery status- I will monitor her for vitamin deficiencies. -     IBC panel -     Vitamin B12 -     Folate -     VITAMIN D 25 Hydroxy (Vit-D Deficiency, Fractures) -     Vitamin B1 -     Zinc; Future -     Zinc  Other iron deficiency anemia- Her H&H and iron levels are normal now. -     IBC panel -     CBC with Differential/Platelet -     Ferritin  Nutritional anemia - Her H&H are normal now.  She has developed a mild leukocytosis and thrombocytosis.  She is asymptomatic with this.  I will treat the folate deficiency and monitor for other vitamin deficiencies.  I have asked her to return in about 2 months for me to recheck her white cell count and platelet count and will monitor for lymphoproliferative disease. -     IBC panel -     Vitamin B12 -     CBC with Differential/Platelet -     Ferritin -     Folate -     Zinc; Future -     Zinc  Thiamine deficiency -     CBC with Differential/Platelet -     Vitamin B1  Vitamin D deficiency -     VITAMIN D 25 Hydroxy (Vit-D Deficiency, Fractures)  Routine general medical examination at a health  care facility- Exam completed, labs reviewed, vaccines reviewed and updated, cervical cancer screening is up-to-date, breast cancer screening is up-to-date, will screen for colon cancer/polyps with Cologuard. -     Lipid panel  Benign paroxysmal positional vertigo due to bilateral vestibular disorder  Colon cancer screening -     Cologuard  Need for influenza vaccination -     Flu Vaccine QUAD 36+ mos IM  Benign paroxysmal positional vertigo, bilateral -     diazepam (VALIUM) 5 MG tablet; Take 1 tablet (5 mg total) by mouth every 6 (six) hours as needed for anxiety. Pt takes prn for vertigo -     meclizine (ANTIVERT) 25 MG tablet; Take 1 tablet (25 mg total) by mouth 3 (three) times daily as needed for dizziness. Taken prn  Folate deficiency -     folic acid (FOLVITE) 1 MG tablet; Take 1 tablet (1 mg total) by mouth daily.   I have changed Anoop Amend. Hoben's meclizine. I am also having her start on folic acid. Additionally, I am having her maintain her multivitamin, b complex vitamins, B-12, iron polysaccharides, Vitamin D (Ergocalciferol), clobetasol ointment, montelukast, levocetirizine, and diazepam.  Meds ordered this encounter  Medications  . diazepam (VALIUM) 5 MG tablet    Sig: Take 1 tablet (5 mg total) by mouth every 6 (six) hours as needed for anxiety. Pt takes prn for vertigo    Dispense:  30 tablet    Refill:  1  . meclizine (ANTIVERT) 25 MG tablet    Sig: Take 1 tablet (25 mg total) by mouth 3 (three) times daily as needed for dizziness. Taken prn    Dispense:  65 tablet    Refill:  2  . folic acid (FOLVITE) 1 MG tablet    Sig: Take 1 tablet (1 mg total) by mouth  daily.    Dispense:  90 tablet    Refill:  1     Follow-up: Return in about 6 months (around 04/21/2020).  Scarlette Calico, MD

## 2019-10-24 DIAGNOSIS — E538 Deficiency of other specified B group vitamins: Secondary | ICD-10-CM | POA: Insufficient documentation

## 2019-10-24 LAB — LIPID PANEL
Cholesterol: 175 mg/dL (ref 0–200)
HDL: 51.5 mg/dL (ref >39.00)
LDL Cholesterol: 109 mg/dL — ABNORMAL HIGH (ref 0–99)
NonHDL: 123.89
Total CHOL/HDL Ratio: 3
Triglycerides: 73 mg/dL (ref 0.0–149.0)
VLDL: 14.6 mg/dL (ref 0.0–40.0)

## 2019-10-24 LAB — CBC WITH DIFFERENTIAL/PLATELET
Basophils Absolute: 0.1 10*3/uL (ref 0.0–0.1)
Basophils Relative: 1.3 % (ref 0.0–3.0)
Eosinophils Absolute: 0.4 10*3/uL (ref 0.0–0.7)
Eosinophils Relative: 3.6 % (ref 0.0–5.0)
HCT: 41.6 % (ref 36.0–46.0)
Hemoglobin: 13.7 g/dL (ref 12.0–15.0)
Lymphocytes Relative: 32.4 % (ref 12.0–46.0)
Lymphs Abs: 3.6 10*3/uL (ref 0.7–4.0)
MCHC: 32.9 g/dL (ref 30.0–36.0)
MCV: 92.1 fl (ref 78.0–100.0)
Monocytes Absolute: 0.9 10*3/uL (ref 0.1–1.0)
Monocytes Relative: 8.3 % (ref 3.0–12.0)
Neutro Abs: 6 10*3/uL (ref 1.4–7.7)
Neutrophils Relative %: 54.4 % (ref 43.0–77.0)
Platelets: 435 10*3/uL — ABNORMAL HIGH (ref 150.0–400.0)
RBC: 4.52 Mil/uL (ref 3.87–5.11)
RDW: 13.7 % (ref 11.5–15.5)
WBC: 11 10*3/uL — ABNORMAL HIGH (ref 4.0–10.5)

## 2019-10-24 LAB — BASIC METABOLIC PANEL
BUN: 18 mg/dL (ref 6–23)
CO2: 28 mEq/L (ref 19–32)
Calcium: 8.8 mg/dL (ref 8.4–10.5)
Chloride: 105 mEq/L (ref 96–112)
Creatinine, Ser: 0.77 mg/dL (ref 0.40–1.20)
GFR: 78.51 mL/min (ref >60.00)
Glucose, Bld: 97 mg/dL (ref 70–99)
Potassium: 4.5 mEq/L (ref 3.5–5.1)
Sodium: 139 mEq/L (ref 135–145)

## 2019-10-24 LAB — VITAMIN D 25 HYDROXY (VIT D DEFICIENCY, FRACTURES): VITD: 32.08 ng/mL (ref 30.00–100.00)

## 2019-10-24 LAB — ZINC: Zinc: 65 ug/dL (ref 60–130)

## 2019-10-24 LAB — IBC PANEL
Iron: 58 ug/dL (ref 42–145)
Saturation Ratios: 15.4 % — ABNORMAL LOW (ref 20.0–50.0)
Transferrin: 269 mg/dL (ref 212.0–360.0)

## 2019-10-24 LAB — FERRITIN: Ferritin: 92.3 ng/mL (ref 10.0–291.0)

## 2019-10-24 LAB — VITAMIN B12: Vitamin B-12: 345 pg/mL (ref 211–911)

## 2019-10-24 LAB — HM MAMMOGRAPHY

## 2019-10-24 LAB — TSH: TSH: 1.44 u[IU]/mL (ref 0.35–4.50)

## 2019-10-24 LAB — FOLATE: Folate: 5 ng/mL — ABNORMAL LOW (ref >5.9)

## 2019-10-24 MED ORDER — FOLIC ACID 1 MG PO TABS: 1.0000 mg | ORAL_TABLET | Freq: Every day | ORAL | 1 refills | Status: DC

## 2019-10-25 ENCOUNTER — Encounter: Payer: Self-pay | Admitting: Internal Medicine

## 2019-10-26 ENCOUNTER — Encounter: Payer: Self-pay | Admitting: Internal Medicine

## 2019-10-26 LAB — PTH, INTACT AND CALCIUM
Calcium: 9.3 mg/dL (ref 8.6–10.4)
PTH: 112 pg/mL — ABNORMAL HIGH (ref 14–64)

## 2019-10-26 LAB — VITAMIN B1: Vitamin B1 (Thiamine): 20 nmol/L (ref 8–30)

## 2019-11-06 DIAGNOSIS — Z1231 Encounter for screening mammogram for malignant neoplasm of breast: Secondary | ICD-10-CM | POA: Diagnosis not present

## 2019-11-06 DIAGNOSIS — Z6832 Body mass index (BMI) 32.0-32.9, adult: Secondary | ICD-10-CM | POA: Diagnosis not present

## 2019-11-06 DIAGNOSIS — Z01419 Encounter for gynecological examination (general) (routine) without abnormal findings: Secondary | ICD-10-CM | POA: Diagnosis not present

## 2019-11-14 ENCOUNTER — Encounter: Payer: Self-pay | Admitting: Family Medicine

## 2019-11-14 ENCOUNTER — Other Ambulatory Visit: Payer: Self-pay

## 2019-11-14 ENCOUNTER — Ambulatory Visit: Payer: Self-pay

## 2019-11-14 ENCOUNTER — Ambulatory Visit: Payer: BC Managed Care – PPO | Admitting: Family Medicine

## 2019-11-14 VITALS — BP 108/72 | HR 86 | Ht 71.0 in | Wt 217.0 lb

## 2019-11-14 DIAGNOSIS — M25521 Pain in right elbow: Secondary | ICD-10-CM | POA: Diagnosis not present

## 2019-11-14 DIAGNOSIS — M7711 Lateral epicondylitis, right elbow: Secondary | ICD-10-CM | POA: Insufficient documentation

## 2019-11-14 NOTE — Patient Instructions (Signed)
Exercises 3x a week Wrist brace Note for standing desk Pen[employer]aid Ice Avoid overhand lifting See me in 4-5 weeks

## 2019-11-14 NOTE — Assessment & Plan Note (Signed)
Right lateral epicondylitis.  Discussed with patient in great length.  Patient does do a lot of repetitive wrist extension who told her she will need to avoid this.  Unable to do nitroglycerin secondary to history of migraines.  Topical anti-inflammatories, avoiding overhead lifting, wrist brace, home exercises, follow-up in 4 to 5 weeks.  Worsening symptoms consider x-rays, physical therapy, PRP.

## 2019-11-14 NOTE — Progress Notes (Signed)
[place] Phone: [phone] Subjective:   [name], am serving as a scribe for Dr. Hulan Saas. This visit occurred during the SARS-CoV-2 public health emergency.  Safety protocols were in place, including screening questions prior to the visit, additional usage of staff PPE, and extensive cleaning of exam room while observing appropriate contact time as indicated for disinfecting solutions.  I'm seeing this patient by the request  of:  Janith Lima, MD  CC: Right elbow pain  [id number]  Sydney [name] is a 54 y.o. female coming in with complaint of right elbow pain. Patient states that she has screws in the elbow from a break 17 years ago. Pain occurring over lateral epicondyle for 2 months. Pain increases with picking up heavier items. Denies any radiating symptoms.  Rates the severity of pain is 8 out of 10       Past Medical History:  Diagnosis Date  . Abdominal pain, RLQ   . Right ovarian cyst    Past Surgical History:  Procedure Laterality Date  . CHOLECYSTECTOMY  1990  . CYSTOSCOPY  02/17/2012   Procedure: CYSTOSCOPY;  Surgeon: Darlyn Chamber, MD;  Location: [facility];  Service: Gynecology;;  . DIAGNOSTIC LAPAROSCOPY  1998   ENDOMETRIOSIS  . GANGLION CYST REMOVED  X3   LAST ONE 1989   LEFT WRIST  . LIPOMA REMOVED  2008   RIGHT PALM  . MINI GASTRIC BYPASS  2009  . NASAL SINUS SURGERY  X3    LAST ONE 1996  . ORIF RIGHT ELBOW FX  2004  . PUBOVAGINAL SLING  2001  . 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 TONSILLECTOMY  1975  . VAGINAL HYSTERECTOMY  1999   Social History   Socioeconomic History  . Marital status: Married    Spouse name: Not on file  . Number of children: Not on file  . Years of education: Not on file  . Highest education level: Not on file  Occupational History  . Not on file  Tobacco Use  . Smoking status: Never Smoker  . Smokeless tobacco: Never  Used  Substance and Sexual Activity  . Alcohol use: Yes    Comment: OCCASIONAL  . Drug use: No  . Sexual activity: Yes    Partners: Male  Other Topics Concern  . Not on file  Social History Narrative   Regular exercise: yes, walk 20 min daily   Caffeine use: 1 cup of coffee daily   Social Determinants of Health   Financial Resource Strain:   . Difficulty of Paying Living Expenses: Not on file  Food Insecurity:   . Worried About Charity fundraiser in the Last Year: Not on file  . Ran Out of Food in the Last Year: Not on file  Transportation Needs:   . Lack of Transportation (Medical): Not on file  . Lack of Transportation (Non-Medical): Not on file  Physical Activity:   . Days of Exercise per Week: Not on file  . Minutes of Exercise per Session: Not on file  Stress:   . Feeling of Stress : Not on file  Social Connections:   . Frequency of Communication with Friends and Family: Not on file  . Frequency of Social Gatherings with Friends and Family: Not on file  . Attends Religious Services: Not on file  . Active Member of Clubs or Organizations: Not on file  . Attends  Club or Organization Meetings: Not on file  . Marital Status: Not on file   Allergies  Allergen Reactions  . Codeine Hives   Family History  Problem Relation Age of Onset  . Heart failure Mother   . Heart failure Father   . Hypertension Father   . Diabetes Father   . Cancer Other       Current Outpatient Medications (Respiratory):  .  levocetirizine (XYZAL) 5 MG tablet, Take 2 tablets (10 mg total) by mouth every evening. .  montelukast (SINGULAIR) 10 MG tablet, Take 1 tablet (10 mg total) by mouth at bedtime.   Current Outpatient Medications (Hematological):  [name]  Cyanocobalamin (B-12) 1000 MCG SUBL, Place 2,000 mcg under the tongue daily. .  folic acid (FOLVITE) 1 MG tablet, Take 1 tablet (1 mg total) by mouth daily. .  iron polysaccharides (NIFEREX) 150 MG capsule, Take 1 capsule (150 mg total)  by mouth daily.  Current Outpatient Medications (Other):  .  b complex vitamins capsule, Take 1 capsule by mouth daily. .  diazepam (VALIUM) 5 MG tablet, Take 1 tablet (5 mg total) by mouth every 6 (six) hours as needed for anxiety. Pt takes prn for vertigo .  meclizine (ANTIVERT) 25 MG tablet, Take 1 tablet (25 mg total) by mouth 3 (three) times daily as needed for dizziness. Taken prn .  Multiple Vitamin (MULTIVITAMIN) tablet, Take 1 tablet by mouth 2 (two) times daily. .  Vitamin D, Ergocalciferol, (DRISDOL) 1.25 MG (50000 UT) CAPS capsule, Take 100,000 units 5/7 days, and 50,000 units 2/7 days.   Reviewed prior external information including notes and imaging from  primary care provider As well as notes that were available from care everywhere and other healthcare systems.  Past medical history, social, surgical and family history all reviewed in electronic medical record.  No pertanent information unless stated regarding to the chief complaint.   Review of Systems:  No headache, visual changes, nausea, vomiting, diarrhea, constipation, dizziness, abdominal pain, skin rash, fevers, chills, night sweats, weight loss, swollen lymph nodes, body aches, joint swelling, chest pain, shortness of breath, mood changes. POSITIVE muscle aches  Objective  Blood pressure 108/72, pulse 86, height 5\' 11"  (1.803 m), weight 217 lb (98.4 kg), SpO2 97 %.   General: No apparent distress alert and oriented x3 mood and affect normal, dressed appropriately.  HEENT: Pupils equal, extraocular movements intact  Respiratory: Patient's speak in full sentences and does not appear short of breath  Cardiovascular: No lower extremity edema, non tender, no erythema  Skin: Warm dry intact with no signs of infection or rash on extremities or on axial skeleton.  Abdomen: Soft nontender  Neuro: Cranial nerves II through XII are intact, neurovascularly intact in all extremities with 2+ DTRs and 2+ pulses.  Lymph: No  lymphadenopathy of posterior or anterior cervical chain or axillae bilaterally.  Gait normal with good balance and coordination.  MSK:  Non tender with full range of motion and good stability and symmetric strength and tone of shoulders,  wrist, hip, knee and ankles bilaterally.  Right elbow exam shows the patient does have tender to palpation over the lateral epicondylar area, severe tenderness noted with resisted wrist extension.  Patient does have an incision from previous surgery.  Well-healed but mild scar tissue formation in the area.  Lacks the last 2 degrees of extension in the last 2 degrees of supination.  Good grip strength neurovascularly intact distally  Limited musculoskeletal ultrasound was performed and interpreted by Alroy Dust  Koren Bound  Limited ultrasound of patient's lateral epicondylar region shows the patient does have a partial-thickness tear of the common extensor tendon.  No significant retraction noted.  No swelling of the joint.  Some postsurgical changes of the radial head is noted.  Hypoechoic changes and increasing Doppler flow in neovascularization noted.   Impression: Common extensor tendon tear partial  97110; 15 additional minutes spent for Therapeutic exercises as stated in above notes.  This included exercises focusing on stretching, strengthening, with significant focus on eccentric aspects.   Long term goals include an improvement in range of motion, strength, endurance as well as avoiding reinjury. Patient's frequency would include in 1-2 times a day, 3-5 times a week for a duration of 6-12 weeks.   Elbow anatomy was reviewed, and tendinopathy was explained.  Pt. given a home rehab program. Start with isometrics and ROM, then a series of concentric and eccentric exercises should be done starting with no weight, work up to 1 lb, hammer, etc.  Use counterforce strap if working or using hands.  Formal PT would be beneficial. Emphasized stretching an cross-friction  massage Emphasized proper palms up lifting biomechanics to unload ECRB Proper technique shown and discussed handout in great detail with ATC.  All questions were discussed and answered.     Impression and Recommendations:     This case required medical decision making of moderate complexity. The above documentation has been reviewed and is accurate and complete Lyndal Pulley, DO       Note: This dictation was prepared with Dragon dictation along with smaller phrase technology. Any transcriptional errors that result from this process are unintentional.

## 2019-12-15 ENCOUNTER — Ambulatory Visit: Payer: BC Managed Care – PPO

## 2019-12-18 ENCOUNTER — Ambulatory Visit (INDEPENDENT_AMBULATORY_CARE_PROVIDER_SITE_OTHER): Payer: BC Managed Care – PPO

## 2019-12-18 ENCOUNTER — Other Ambulatory Visit: Payer: Self-pay

## 2019-12-18 ENCOUNTER — Ambulatory Visit: Payer: BC Managed Care – PPO | Admitting: Family Medicine

## 2019-12-18 ENCOUNTER — Encounter: Payer: Self-pay | Admitting: Family Medicine

## 2019-12-18 VITALS — BP 108/78 | HR 81 | Ht 71.0 in | Wt 217.0 lb

## 2019-12-18 DIAGNOSIS — M25521 Pain in right elbow: Secondary | ICD-10-CM

## 2019-12-18 DIAGNOSIS — M7711 Lateral epicondylitis, right elbow: Secondary | ICD-10-CM | POA: Diagnosis not present

## 2019-12-18 MED ORDER — MELOXICAM 15 MG PO TABS: 15.0000 mg | ORAL_TABLET | Freq: Every day | ORAL | 0 refills | Status: DC

## 2019-12-18 NOTE — Assessment & Plan Note (Addendum)
Chronic problem with worsening symptoms at this time.  Discussed with patient about not making improvement and given different treatment options.  Patient declined PRP or the potential for physical therapy secondary to financial restraints and social determinants of health.  Patient started on meloxicam that I think will be beneficial and warned of potential side effects.  Continue bracing and home exercises.  Follow-up in 6 weeks and will consider physical therapy or injections.  Social determinants of health include patient's history of migraines still unable to use the microphone nitroglycerin protocol.

## 2019-12-18 NOTE — Patient Instructions (Signed)
Meloxicam daily for 10 days then as needed  Continue with everything else See me in 6 weeks

## 2019-12-18 NOTE — Progress Notes (Signed)
[place] Phone: [phone] Subjective:   Sydney [name], am serving as a scribe for Dr. Hulan Saas. This visit occurred during the SARS-CoV-2 public health emergency.  Safety protocols were in place, including screening questions prior to the visit, additional usage of staff PPE, and extensive cleaning of exam room while observing appropriate contact time as indicated for disinfecting solutions.  I'm seeing this patient by the request  of:  Janith Lima, MD  CC: Elbow pain follow-up  [id number]   11/14/2019 Right lateral epicondylitis.  Discussed with patient in great length.  Patient does do a lot of repetitive wrist extension who told her she will need to avoid this.  Unable to do nitroglycerin secondary to history of migraines.  Topical anti-inflammatories, avoiding overhead lifting, wrist brace, home exercises, follow-up in 4 to 5 weeks.  Worsening symptoms consider x-rays, physical therapy, PRP.  Update 12/18/2019 Sydney [name] is a 54 y.o. female coming in with complaint of right elbow pain. Patient states that her elbow seems to be more irritated. Has been wearing brace during the day but less at night. Has not worn brace to bed much since she removed it in her sleep. Pain continues over lateral epicondyle. Intensity of pain is worse especially when she picks up cup of coffee.      Past Medical History:  Diagnosis Date  . Abdominal pain, RLQ   . Right ovarian cyst    Past Surgical History:  Procedure Laterality Date  . CHOLECYSTECTOMY  1990  . CYSTOSCOPY  02/17/2012   Procedure: CYSTOSCOPY;  Surgeon: Darlyn Chamber, MD;  Location: [facility];  Service: Gynecology;;  . DIAGNOSTIC LAPAROSCOPY  1998   ENDOMETRIOSIS  . GANGLION CYST REMOVED  X3   LAST ONE 1989   LEFT WRIST  . LIPOMA REMOVED  2008   RIGHT PALM  . MINI GASTRIC BYPASS  2009  . NASAL SINUS SURGERY  X3    LAST ONE  1996  . ORIF RIGHT ELBOW FX  2004  . PUBOVAGINAL SLING  2001  . 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 TONSILLECTOMY  1975  . VAGINAL HYSTERECTOMY  1999   Social History   Socioeconomic History  . Marital status: Married    Spouse name: Not on file  . Number of children: Not on file  . Years of education: Not on file  . Highest education level: Not on file  Occupational History  . Not on file  Tobacco Use  . Smoking status: Never Smoker  . Smokeless tobacco: Never Used  Substance and Sexual Activity  . Alcohol use: Yes    Comment: OCCASIONAL  . Drug use: [name]  . Sexual activity: Yes    Partners: Male  Other Topics Concern  . Not on file  Social History Narrative   Regular exercise: yes, walk 20 min daily   Caffeine use: 1 cup of coffee daily   Social Determinants of Health   Financial Resource Strain:   . Difficulty of Paying Living Expenses:   Food Insecurity:   . Worried About Charity fundraiser in the Last Year:   . Arboriculturist in the Last Year:   Transportation Needs:   . Film/video editor (Medical):   [name] Lack of Transportation (Non-Medical):   Physical Activity:   . Days of Exercise per Week:   . Minutes of Exercise per Session:   Stress:   .  Feeling of Stress :   Social Connections:   . Frequency of Communication with Friends and Family:   . Frequency of Social Gatherings with Friends and Family:   . Attends Religious Services:   . Active Member of Clubs or Organizations:   . Attends Archivist Meetings:   [name] Marital Status:    Allergies  Allergen Reactions  . Codeine Hives   Family History  Problem Relation Age of Onset  . Heart failure Mother   . Heart failure Father   . Hypertension Father   . Diabetes Father   . Cancer Other       Current Outpatient Medications (Respiratory):  .  levocetirizine (XYZAL) 5 MG tablet, Take 2 tablets (10 mg total) by mouth every evening. .  montelukast (SINGULAIR) 10 MG tablet, Take 1 tablet (10 mg  total) by mouth at bedtime.  Current Outpatient Medications (Analgesics):  .  meloxicam (MOBIC) 15 MG tablet, Take 1 tablet (15 mg total) by mouth daily.  Current Outpatient Medications (Hematological):  [name]  Cyanocobalamin (B-12) 1000 MCG SUBL, Place 2,000 mcg under the tongue daily. .  folic acid (FOLVITE) 1 MG tablet, Take 1 tablet (1 mg total) by mouth daily. .  iron polysaccharides (NIFEREX) 150 MG capsule, Take 1 capsule (150 mg total) by mouth daily.  Current Outpatient Medications (Other):  .  b complex vitamins capsule, Take 1 capsule by mouth daily. .  diazepam (VALIUM) 5 MG tablet, Take 1 tablet (5 mg total) by mouth every 6 (six) hours as needed for anxiety. Pt takes prn for vertigo .  meclizine (ANTIVERT) 25 MG tablet, Take 1 tablet (25 mg total) by mouth 3 (three) times daily as needed for dizziness. Taken prn .  Multiple Vitamin (MULTIVITAMIN) tablet, Take 1 tablet by mouth 2 (two) times daily. .  Vitamin D, Ergocalciferol, (DRISDOL) 1.25 MG (50000 UT) CAPS capsule, Take 100,000 units 5/7 days, and 50,000 units 2/7 days.   Reviewed prior external information including notes and imaging from  primary care provider As well as notes that were available from care everywhere and other healthcare systems.  Past medical history, social, surgical and family history all reviewed in electronic medical record.  [name] pertanent information unless stated regarding to the chief complaint.   Review of Systems:  [name] headache, visual changes, nausea, vomiting, diarrhea, constipation, dizziness, abdominal pain, skin rash, fevers, chills, night sweats, weight loss, swollen lymph nodes, body aches, joint swelling, chest pain, shortness of breath, mood changes. POSITIVE muscle aches  Objective  Blood pressure 108/78, pulse 81, height 5\' 11"  (1.803 m), weight 217 lb (98.4 kg), SpO2 97 %.   General: [name] apparent distress alert and oriented x3 mood and affect normal, dressed appropriately.  HEENT:  Pupils equal, extraocular movements intact  Respiratory: Patient's speak in full sentences and does not appear short of breath  Cardiovascular: [name] lower extremity edema, non tender, [name] erythema  Neuro: Cranial nerves II through XII are intact, neurovascularly intact in all extremities with 2+ DTRs and 2+ pulses.  Gait normal with good balance and coordination.  MSK: Right elbow exam shows the patient is wearing a brace on her wrist.  She is missing pain with resisted extension of the wrist at the elbow.  Severely tender over the lateral epicondylar region.  Good grip strength still noted.  Neurovascular intact distally.  Limited musculoskeletal ultrasound was performed and interpreted by Lyndal Pulley  Limited ultrasound of patient's right elbow shows the patient does have hypoechoic  changes and increasing Doppler flow with neovascularization at the insertion of the common extensor tendon lateral epicondylar region.  [name] avulsion noted. Impression: Worsening lateral epicondylitis with intersubstance tearing noted.    Impression and Recommendations:     This case required medical decision making of moderate complexity. The above documentation has been reviewed and is accurate and complete Lyndal Pulley, DO       Note: This dictation was prepared with Dragon dictation along with smaller phrase technology. Any transcriptional errors that result from this process are unintentional.

## 2020-01-14 ENCOUNTER — Other Ambulatory Visit: Payer: Self-pay | Admitting: Family Medicine

## 2020-01-17 ENCOUNTER — Other Ambulatory Visit: Payer: Self-pay

## 2020-01-17 ENCOUNTER — Encounter: Payer: Self-pay | Admitting: Physician Assistant

## 2020-01-17 ENCOUNTER — Ambulatory Visit: Payer: BC Managed Care – PPO | Admitting: Physician Assistant

## 2020-01-17 DIAGNOSIS — D1801 Hemangioma of skin and subcutaneous tissue: Secondary | ICD-10-CM

## 2020-01-17 DIAGNOSIS — D485 Neoplasm of uncertain behavior of skin: Secondary | ICD-10-CM | POA: Diagnosis not present

## 2020-01-17 DIAGNOSIS — L988 Other specified disorders of the skin and subcutaneous tissue: Secondary | ICD-10-CM | POA: Diagnosis not present

## 2020-01-17 DIAGNOSIS — D229 Melanocytic nevi, unspecified: Secondary | ICD-10-CM | POA: Diagnosis not present

## 2020-01-17 DIAGNOSIS — L918 Other hypertrophic disorders of the skin: Secondary | ICD-10-CM

## 2020-01-17 DIAGNOSIS — D2272 Melanocytic nevi of left lower limb, including hip: Secondary | ICD-10-CM | POA: Diagnosis not present

## 2020-01-17 DIAGNOSIS — L814 Other melanin hyperpigmentation: Secondary | ICD-10-CM

## 2020-01-17 DIAGNOSIS — B078 Other viral warts: Secondary | ICD-10-CM | POA: Diagnosis not present

## 2020-01-17 DIAGNOSIS — Z1283 Encounter for screening for malignant neoplasm of skin: Secondary | ICD-10-CM

## 2020-01-17 NOTE — Progress Notes (Signed)
Follow up Visit  Subjective  Sydney [name] is a 54 y.o. female who presents for the following: Skin Problem (check spot on left upper thigh, maybe a wart and used otc frezze and it didnt go away. #2 spot right arm x 2-3 months #3 spot on tailbone and hs got darker). Rough growth on left upper thigh. Warty texture, tried otc wart remover but it didn't clear. It scales and has been there a coupe months. It is not sore. Raised mole right forearm. Seems to get red and irritated and then fades. This has been a problem for a couple months. Mole on buttocks present for years but seems to have gotten darker.   Objective  Well appearing patient in no apparent distress; mood and affect are within normal limits.  A full examination was performed including head, eyes, ears, nose, lips, neck, chest, axillae, abdomen, back, buttocks, bilateral upper extremities, bilateral lower extremities, hands, feet, fingers, toes, fingernails, and toenails. All findings within normal limits unless otherwise noted below. No suspicious moles noted on back.   Objective  Left Thigh - Anterior: Warty papule     Objective  Right Forearm - Posterior: Inflamed papule     Objective  Left Hip (side) - Posterior: Dark brown macule     Objective  Pubic: Tiny flesh colored papule frontal pubic area.  Assessment & Plan    Lentigines - Scattered tan macules - Discussed due to sun exposure - Benign, observe - Call for any changes   Melanocytic Nevi - Tan-brown and/or pink-flesh-colored symmetric macules and papules - Benign appearing on exam today - Observation - Call clinic for new or changing moles - Recommend daily use of broad spectrum spf 30+ sunscreen to sun-exposed areas.   Hemangiomas - Red papules - Discussed benign nature - Observe - Call for any changes    Skin cancer screening performed today.   Neoplasm of uncertain behavior of skin (3) Left Thigh - Anterior  Skin / nail  biopsy Type of biopsy: tangential   Informed consent: discussed and consent obtained   Procedure prep:  Patient was prepped and draped in usual sterile fashion (Non sterile) Prep type:  Chlorhexidine Anesthesia: the lesion was anesthetized in a standard fashion   Anesthetic:  1% lidocaine w/ epinephrine 1-100,000 local infiltration Instrument used: flexible razor blade   Hemostasis achieved with: aluminum chloride   Outcome: patient tolerated procedure well   Post-procedure details: wound care instructions given    Specimen 1 - Surgical pathology Differential Diagnosis: r/o wart Check Margins: No  Right Forearm - Posterior  Skin / nail biopsy Type of biopsy: tangential   Informed consent: discussed and consent obtained   Procedure prep:  Patient was prepped and draped in usual sterile fashion (Non sterile) Prep type:  Chlorhexidine Anesthesia: the lesion was anesthetized in a standard fashion   Anesthetic:  1% lidocaine w/ epinephrine 1-100,000 local infiltration Instrument used: flexible razor blade   Hemostasis achieved with: aluminum chloride   Outcome: patient tolerated procedure well   Post-procedure details: wound care instructions given    Specimen 2 - Surgical pathology Differential Diagnosis: ak Check Margins: No  Left Hip (side) - Posterior  Skin / nail biopsy Type of biopsy: tangential   Informed consent: discussed and consent obtained   Procedure prep:  Patient was prepped and draped in usual sterile fashion (Non sterile) Prep type:  Chlorhexidine Anesthesia: the lesion was anesthetized in a standard fashion   Anesthetic:  1% lidocaine w/ epinephrine 1-100,000  local infiltration Instrument used: flexible razor blade   Hemostasis achieved with: aluminum chloride   Outcome: patient tolerated procedure well   Post-procedure details: wound care instructions given    Specimen 3 - Surgical pathology Differential Diagnosis: sk vs atypia Check Margins: No  Skin  tag Pubic  Epidermal / dermal shaving - Pubic  Informed consent: discussed and consent obtained   Timeout: patient name, date of birth, surgical site, and procedure verified   Instrument used: scissors   Hemostasis achieved with: ferric subsulfate   Outcome: patient tolerated procedure well   Additional details:  No charge

## 2020-01-17 NOTE — Patient Instructions (Signed)

## 2020-01-23 ENCOUNTER — Telehealth: Payer: Self-pay

## 2020-01-23 NOTE — Telephone Encounter (Signed)
Phone call to patient with her Pathology results and Anderson Malta Clark-Bruning's recommendations.  Patient aware of results and recommendations.

## 2020-01-23 NOTE — Telephone Encounter (Signed)
-----   Message from Arlyss Gandy, [place] sent at 01/23/2020  8:44 AM EDT ----- Mertha Finders benign but may grow back. Can freeze if it does. Recheck mod atypia in 3-4 months.

## 2020-01-31 ENCOUNTER — Ambulatory Visit: Payer: BC Managed Care – PPO | Admitting: Family Medicine

## 2020-02-11 ENCOUNTER — Ambulatory Visit: Payer: Self-pay

## 2020-02-11 ENCOUNTER — Other Ambulatory Visit: Payer: Self-pay

## 2020-02-11 ENCOUNTER — Ambulatory Visit: Payer: BC Managed Care – PPO | Admitting: Family Medicine

## 2020-02-11 ENCOUNTER — Other Ambulatory Visit: Payer: Self-pay | Admitting: Family Medicine

## 2020-02-11 ENCOUNTER — Encounter: Payer: Self-pay | Admitting: Family Medicine

## 2020-02-11 VITALS — BP 116/82 | HR 87 | Ht 71.0 in | Wt 224.0 lb

## 2020-02-11 DIAGNOSIS — M7711 Lateral epicondylitis, right elbow: Secondary | ICD-10-CM | POA: Diagnosis not present

## 2020-02-11 DIAGNOSIS — M25521 Pain in right elbow: Secondary | ICD-10-CM

## 2020-02-11 NOTE — Assessment & Plan Note (Addendum)
Discussed with patient in great length.  Discussed icing regimen and home exercises, we discussed that a counterforce brace instead of the wrist brace secondary to patient not feeling like making any significant improvement at the moment.  We discussed which activities to do which will avoid.  Increase activity slowly.  Refilled the meloxicam.  Follow-up again in 4 to 8 weeks

## 2020-02-11 NOTE — Progress Notes (Signed)
[place] [address] [place] Phone: [phone] Subjective:   I Sydney [name] am serving as a Education administrator for Dr. Hulan Saas.  This visit occurred during the SARS-CoV-2 public health emergency.  Safety protocols were in place, including screening questions prior to the visit, additional usage of staff PPE, and extensive cleaning of exam room while observing appropriate contact time as indicated for disinfecting solutions.   I'm seeing this patient by the request  of:  Janith Lima, MD  CC: Right elbow pain follow-up  [id number]   12/18/2019 Chronic problem with worsening symptoms at this time.  Discussed with patient about not making improvement and given different treatment options.  Patient declined PRP or the potential for physical therapy secondary to financial restraints and social determinants of health.  Patient started on meloxicam that I think will be beneficial and warned of potential side effects.  Continue bracing and home exercises.  Follow-up in 6 weeks and will consider physical therapy or injections.  Social determinants of health include patient's history of migraines still unable to use the microphone nitroglycerin protocol.  02/11/2020 Sydney [name] is a 54 y.o. female coming in with complaint of right elbow pain. Patient doesn't believe she has mad progress. Past few weeks fingers are sore. Not wearing the brace as much as she should but has been doing the exercises.  Patient feels at this moment she has not made any significant improvement.     Past Medical History:  Diagnosis Date  . Abdominal pain, RLQ   . Right ovarian cyst    Past Surgical History:  Procedure Laterality Date  . CHOLECYSTECTOMY  1990  . CYSTOSCOPY  02/17/2012   Procedure: CYSTOSCOPY;  Surgeon: Darlyn Chamber, MD;  Location: [facility];  Service: Gynecology;;  . DIAGNOSTIC LAPAROSCOPY  1998   ENDOMETRIOSIS  . GANGLION  CYST REMOVED  X3   LAST ONE 1989   LEFT WRIST  . LIPOMA REMOVED  2008   RIGHT PALM  . MINI GASTRIC BYPASS  2009  . NASAL SINUS SURGERY  X3    LAST ONE 1996  . ORIF RIGHT ELBOW FX  2004  . PUBOVAGINAL SLING  2001  . 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 TONSILLECTOMY  1975  . VAGINAL HYSTERECTOMY  1999   Social History   Socioeconomic History  . Marital status: Married    Spouse name: Not on file  . Number of children: Not on file  . Years of education: Not on file  . Highest education level: Not on file  Occupational History  . Not on file  Tobacco Use  . Smoking status: Never Smoker  . Smokeless tobacco: Never Used  Substance and Sexual Activity  . Alcohol use: Yes    Comment: OCCASIONAL  . Drug use: No  . Sexual activity: Yes    Partners: Male  Other Topics Concern  . Not on file  Social History Narrative   Regular exercise: yes, walk 20 min daily   Caffeine use: 1 cup of coffee daily   Social Determinants of Health   Financial Resource Strain:   . Difficulty of Paying Living Expenses:   Food Insecurity:   . Worried About Charity fundraiser in the Last Year:   . Arboriculturist in the Last Year:   Transportation Needs:   . Film/video editor (Medical):   [name] Lack of Transportation (Non-Medical):   Physical Activity:   .  Days of Exercise per Week:   . Minutes of Exercise per Session:   Stress:   . Feeling of Stress :   Social Connections:   . Frequency of Communication with Friends and Family:   . Frequency of Social Gatherings with Friends and Family:   . Attends Religious Services:   . Active Member of Clubs or Organizations:   . Attends Archivist Meetings:   [name] Marital Status:    Allergies  Allergen Reactions  . Codeine Hives   Family History  Problem Relation Age of Onset  . Heart failure Mother   . Heart failure Father   . Hypertension Father   . Diabetes Father   . Cancer Other       Current Outpatient Medications (Respiratory):    .  levocetirizine (XYZAL) 5 MG tablet, Take 2 tablets (10 mg total) by mouth every evening. .  montelukast (SINGULAIR) 10 MG tablet, Take 1 tablet (10 mg total) by mouth at bedtime.  Current Outpatient Medications (Analgesics):  .  meloxicam (MOBIC) 15 MG tablet, TAKE 1 TABLET(15 MG) BY MOUTH DAILY  Current Outpatient Medications (Hematological):  [name]  Cyanocobalamin (B-12) 1000 MCG SUBL, Place 2,000 mcg under the tongue daily. .  folic acid (FOLVITE) 1 MG tablet, Take 1 tablet (1 mg total) by mouth daily. .  iron polysaccharides (NIFEREX) 150 MG capsule, Take 1 capsule (150 mg total) by mouth daily.  Current Outpatient Medications (Other):  .  b complex vitamins capsule, Take 1 capsule by mouth daily. .  diazepam (VALIUM) 5 MG tablet, Take 1 tablet (5 mg total) by mouth every 6 (six) hours as needed for anxiety. Pt takes prn for vertigo .  meclizine (ANTIVERT) 25 MG tablet, Take 1 tablet (25 mg total) by mouth 3 (three) times daily as needed for dizziness. Taken prn .  Multiple Vitamin (MULTIVITAMIN) tablet, Take 1 tablet by mouth 2 (two) times daily. .  Vitamin D, Ergocalciferol, (DRISDOL) 1.25 MG (50000 UT) CAPS capsule, Take 100,000 units 5/7 days, and 50,000 units 2/7 days.   Reviewed prior external information including notes and imaging from  primary care provider As well as notes that were available from care everywhere and other healthcare systems.  Past medical history, social, surgical and family history all reviewed in electronic medical record.  No pertanent information unless stated regarding to the chief complaint.   Review of Systems:  No headache, visual changes, nausea, vomiting, diarrhea, constipation, dizziness, abdominal pain, skin rash, fevers, chills, night sweats, weight loss, swollen lymph nodes, body aches, joint swelling, chest pain, shortness of breath, mood changes. POSITIVE muscle aches  Objective  Blood pressure 116/82, pulse 87, height 5\' 11"  (1.803 m),  weight 224 lb (101.6 kg), SpO2 98 %.   General: No apparent distress alert and oriented x3 mood and affect normal, dressed appropriately.  HEENT: Pupils equal, extraocular movements intact  Respiratory: Patient's speak in full sentences and does not appear short of breath  Cardiovascular: No lower extremity edema, non tender, no erythema  Neuro: Cranial nerves II through XII are intact, neurovascularly intact in all extremities with 2+ DTRs and 2+ pulses.  Gait normal with good balance and coordination.  MSK: Right elbow exam shows the patient is still tender to palpation over the lateral epicondylar region.  Patient does have pain with resisted wrist extension.  Neurovascular intact distally.  5/5 strength of the hand.  Deep tendon reflexes intact      Impression and Recommendations:  The above documentation has been reviewed and is accurate and complete Lyndal Pulley, DO       Note: This dictation was prepared with Dragon dictation along with smaller phrase technology. Any transcriptional errors that result from this process are unintentional.

## 2020-02-11 NOTE — Patient Instructions (Addendum)
Good to see you Meloxicam refilled Counter force or tennis elbow brace Use wrist brace at night See me again in 5 weeks we will discuss injections if not perfect

## 2020-03-28 ENCOUNTER — Ambulatory Visit: Payer: BC Managed Care – PPO | Admitting: Family Medicine

## 2020-04-04 ENCOUNTER — Ambulatory Visit: Payer: BC Managed Care – PPO | Admitting: Family Medicine

## 2020-04-04 NOTE — Progress Notes (Deleted)
[place] Phone: [phone] Subjective:    I'm seeing this patient by the request  of:  Janith Lima, MD  CC:   [id number]   02/11/2020 Discussed with patient in great length.  Discussed icing regimen and home exercises, we discussed that a counterforce brace instead of the wrist brace secondary to patient not feeling like making any significant improvement at the moment.  We discussed which activities to do which will avoid.  Increase activity slowly.  Refilled the meloxicam.  Follow-up again in 4 to 8 weeks   Update 04/04/2020 Sydney [name] is a 54 y.o. female coming in with complaint of right elbow pain. Patient states       Past Medical History:  Diagnosis Date  . Abdominal pain, RLQ   . Right ovarian cyst    Past Surgical History:  Procedure Laterality Date  . CHOLECYSTECTOMY  1990  . CYSTOSCOPY  02/17/2012   Procedure: CYSTOSCOPY;  Surgeon: Darlyn Chamber, MD;  Location: [facility];  Service: Gynecology;;  . DIAGNOSTIC LAPAROSCOPY  1998   ENDOMETRIOSIS  . GANGLION CYST REMOVED  X3   LAST ONE 1989   LEFT WRIST  . LIPOMA REMOVED  2008   RIGHT PALM  . MINI GASTRIC BYPASS  2009  . NASAL SINUS SURGERY  X3    LAST ONE 1996  . ORIF RIGHT ELBOW FX  2004  . PUBOVAGINAL SLING  2001  . 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 TONSILLECTOMY  1975  . VAGINAL HYSTERECTOMY  1999   Social History   Socioeconomic History  . Marital status: Married    Spouse name: Not on file  . Number of children: Not on file  . Years of education: Not on file  . Highest education level: Not on file  Occupational History  . Not on file  Tobacco Use  . Smoking status: Never Smoker  . Smokeless tobacco: Never Used  Vaping Use  . Vaping Use: Never used  Substance and Sexual Activity  . Alcohol use: Yes    Comment: OCCASIONAL  . Drug use: No  . Sexual activity: Yes    Partners: Male  Other Topics  Concern  . Not on file  Social History Narrative   Regular exercise: yes, walk 20 min daily   Caffeine use: 1 cup of coffee daily   Social Determinants of Health   Financial Resource Strain:   . Difficulty of Paying Living Expenses:   Food Insecurity:   . Worried About Charity fundraiser in the Last Year:   . Arboriculturist in the Last Year:   Transportation Needs:   . Film/video editor (Medical):   [name] Lack of Transportation (Non-Medical):   Physical Activity:   . Days of Exercise per Week:   . Minutes of Exercise per Session:   Stress:   . Feeling of Stress :   Social Connections:   . Frequency of Communication with Friends and Family:   . Frequency of Social Gatherings with Friends and Family:   . Attends Religious Services:   . Active Member of Clubs or Organizations:   . Attends Archivist Meetings:   [name] Marital Status:    Allergies  Allergen Reactions  . Codeine Hives   Family History  Problem Relation Age of Onset  . Heart failure Mother   . Heart failure Father   . Hypertension Father   .  Diabetes Father   . Cancer Other       Current Outpatient Medications (Respiratory):  .  levocetirizine (XYZAL) 5 MG tablet, Take 2 tablets (10 mg total) by mouth every evening. .  montelukast (SINGULAIR) 10 MG tablet, Take 1 tablet (10 mg total) by mouth at bedtime.  Current Outpatient Medications (Analgesics):  .  meloxicam (MOBIC) 15 MG tablet, TAKE 1 TABLET(15 MG) BY MOUTH DAILY  Current Outpatient Medications (Hematological):  [name]  Cyanocobalamin (B-12) 1000 MCG SUBL, Place 2,000 mcg under the tongue daily. .  folic acid (FOLVITE) 1 MG tablet, Take 1 tablet (1 mg total) by mouth daily. .  iron polysaccharides (NIFEREX) 150 MG capsule, Take 1 capsule (150 mg total) by mouth daily.  Current Outpatient Medications (Other):  .  b complex vitamins capsule, Take 1 capsule by mouth daily. .  diazepam (VALIUM) 5 MG tablet, Take 1 tablet (5 mg total) by mouth  every 6 (six) hours as needed for anxiety. Pt takes prn for vertigo .  meclizine (ANTIVERT) 25 MG tablet, Take 1 tablet (25 mg total) by mouth 3 (three) times daily as needed for dizziness. Taken prn .  Multiple Vitamin (MULTIVITAMIN) tablet, Take 1 tablet by mouth 2 (two) times daily. .  Vitamin D, Ergocalciferol, (DRISDOL) 1.25 MG (50000 UT) CAPS capsule, Take 100,000 units 5/7 days, and 50,000 units 2/7 days.   Reviewed prior external information including notes and imaging from  primary care provider As well as notes that were available from care everywhere and other healthcare systems.  Past medical history, social, surgical and family history all reviewed in electronic medical record.  No pertanent information unless stated regarding to the chief complaint.   Review of Systems:  No headache, visual changes, nausea, vomiting, diarrhea, constipation, dizziness, abdominal pain, skin rash, fevers, chills, night sweats, weight loss, swollen lymph nodes, body aches, joint swelling, chest pain, shortness of breath, mood changes. POSITIVE muscle aches  Objective  There were no vitals taken for this visit.   General: No apparent distress alert and oriented x3 mood and affect normal, dressed appropriately.  HEENT: Pupils equal, extraocular movements intact  Respiratory: Patient's speak in full sentences and does not appear short of breath  Cardiovascular: No lower extremity edema, non tender, no erythema  Neuro: Cranial nerves II through XII are intact, neurovascularly intact in all extremities with 2+ DTRs and 2+ pulses.  Gait normal with good balance and coordination.  MSK:  Non tender with full range of motion and good stability and symmetric strength and tone of shoulders, elbows, wrist, hip, knee and ankles bilaterally.     Impression and Recommendations:     The above documentation has been reviewed and is accurate and complete Lyndal Pulley, DO       Note: This dictation was  prepared with Dragon dictation along with smaller phrase technology. Any transcriptional errors that result from this process are unintentional.

## 2020-04-25 ENCOUNTER — Ambulatory Visit: Payer: BC Managed Care – PPO | Admitting: Family Medicine

## 2020-04-25 NOTE — Progress Notes (Deleted)
[place] Phone: [phone] Subjective:    I'm seeing this patient by the request  of:  Janith Lima, MD  CC:   [id number]   02/11/2020 Discussed with patient in great length.  Discussed icing regimen and home exercises, we discussed that a counterforce brace instead of the wrist brace secondary to patient not feeling like making any significant improvement at the moment.  We discussed which activities to do which will avoid.  Increase activity slowly.  Refilled the meloxicam.  Follow-up again in 4 to 8 weeks   Update 04/25/2020 Sydney [name] is a 54 y.o. female coming in with complaint of right elbow pain. Patient states     Past Medical History:  Diagnosis Date  . Abdominal pain, RLQ   . Right ovarian cyst    Past Surgical History:  Procedure Laterality Date  . CHOLECYSTECTOMY  1990  . CYSTOSCOPY  02/17/2012   Procedure: CYSTOSCOPY;  Surgeon: Darlyn Chamber, MD;  Location: [facility];  Service: Gynecology;;  . DIAGNOSTIC LAPAROSCOPY  1998   ENDOMETRIOSIS  . GANGLION CYST REMOVED  X3   LAST ONE 1989   LEFT WRIST  . LIPOMA REMOVED  2008   RIGHT PALM  . MINI GASTRIC BYPASS  2009  . NASAL SINUS SURGERY  X3    LAST ONE 1996  . ORIF RIGHT ELBOW FX  2004  . PUBOVAGINAL SLING  2001  . 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 TONSILLECTOMY  1975  . VAGINAL HYSTERECTOMY  1999   Social History   Socioeconomic History  . Marital status: Married    Spouse name: Not on file  . Number of children: Not on file  . Years of education: Not on file  . Highest education level: Not on file  Occupational History  . Not on file  Tobacco Use  . Smoking status: Never Smoker  . Smokeless tobacco: Never Used  Vaping Use  . Vaping Use: Never used  Substance and Sexual Activity  . Alcohol use: Yes    Comment: OCCASIONAL  . Drug use: No  . Sexual activity: Yes    Partners: Male  Other Topics Concern    . Not on file  Social History Narrative   Regular exercise: yes, walk 20 min daily   Caffeine use: 1 cup of coffee daily   Social Determinants of Health   Financial Resource Strain:   . Difficulty of Paying Living Expenses: Not on file  Food Insecurity:   . Worried About Charity fundraiser in the Last Year: Not on file  . Ran Out of Food in the Last Year: Not on file  Transportation Needs:   . Lack of Transportation (Medical): Not on file  . Lack of Transportation (Non-Medical): Not on file  Physical Activity:   . Days of Exercise per Week: Not on file  . Minutes of Exercise per Session: Not on file  Stress:   . Feeling of Stress : Not on file  Social Connections:   . Frequency of Communication with Friends and Family: Not on file  . Frequency of Social Gatherings with Friends and Family: Not on file  . Attends Religious Services: Not on file  . Active Member of Clubs or Organizations: Not on file  . Attends Archivist Meetings: Not on file  . Marital Status: Not on file   Allergies  Allergen Reactions  . Codeine  Hives   Family History  Problem Relation Age of Onset  . Heart failure Mother   . Heart failure Father   . Hypertension Father   . Diabetes Father   . Cancer Other       Current Outpatient Medications (Respiratory):  .  levocetirizine (XYZAL) 5 MG tablet, Take 2 tablets (10 mg total) by mouth every evening. .  montelukast (SINGULAIR) 10 MG tablet, Take 1 tablet (10 mg total) by mouth at bedtime.  Current Outpatient Medications (Analgesics):  .  meloxicam (MOBIC) 15 MG tablet, TAKE 1 TABLET(15 MG) BY MOUTH DAILY  Current Outpatient Medications (Hematological):  [name]  Cyanocobalamin (B-12) 1000 MCG SUBL, Place 2,000 mcg under the tongue daily. .  folic acid (FOLVITE) 1 MG tablet, Take 1 tablet (1 mg total) by mouth daily. .  iron polysaccharides (NIFEREX) 150 MG capsule, Take 1 capsule (150 mg total) by mouth daily.  Current Outpatient  Medications (Other):  .  b complex vitamins capsule, Take 1 capsule by mouth daily. .  diazepam (VALIUM) 5 MG tablet, Take 1 tablet (5 mg total) by mouth every 6 (six) hours as needed for anxiety. Pt takes prn for vertigo .  meclizine (ANTIVERT) 25 MG tablet, Take 1 tablet (25 mg total) by mouth 3 (three) times daily as needed for dizziness. Taken prn .  Multiple Vitamin (MULTIVITAMIN) tablet, Take 1 tablet by mouth 2 (two) times daily. .  Vitamin D, Ergocalciferol, (DRISDOL) 1.25 MG (50000 UT) CAPS capsule, Take 100,000 units 5/7 days, and 50,000 units 2/7 days.   Reviewed prior external information including notes and imaging from  primary care provider As well as notes that were available from care everywhere and other healthcare systems.  Past medical history, social, surgical and family history all reviewed in electronic medical record.  No pertanent information unless stated regarding to the chief complaint.   Review of Systems:  No headache, visual changes, nausea, vomiting, diarrhea, constipation, dizziness, abdominal pain, skin rash, fevers, chills, night sweats, weight loss, swollen lymph nodes, body aches, joint swelling, chest pain, shortness of breath, mood changes. POSITIVE muscle aches  Objective  There were no vitals taken for this visit.   General: No apparent distress alert and oriented x3 mood and affect normal, dressed appropriately.  HEENT: Pupils equal, extraocular movements intact  Respiratory: Patient's speak in full sentences and does not appear short of breath  Cardiovascular: No lower extremity edema, non tender, no erythema  Neuro: Cranial nerves II through XII are intact, neurovascularly intact in all extremities with 2+ DTRs and 2+ pulses.  Gait normal with good balance and coordination.  MSK:  Non tender with full range of motion and good stability and symmetric strength and tone of shoulders, elbows, wrist, hip, knee and ankles bilaterally.     Impression  and Recommendations:     The above documentation has been reviewed and is accurate and complete Jacqualin Combes       Note: This dictation was prepared with Dragon dictation along with smaller phrase technology. Any transcriptional errors that result from this process are unintentional.

## 2020-04-28 ENCOUNTER — Ambulatory Visit: Payer: BC Managed Care – PPO | Admitting: Physician Assistant

## 2020-07-01 ENCOUNTER — Encounter: Payer: Self-pay | Admitting: Internal Medicine

## 2020-07-01 DIAGNOSIS — Z1211 Encounter for screening for malignant neoplasm of colon: Secondary | ICD-10-CM | POA: Diagnosis not present

## 2020-07-01 DIAGNOSIS — Z1212 Encounter for screening for malignant neoplasm of rectum: Secondary | ICD-10-CM | POA: Diagnosis not present

## 2020-07-01 LAB — COLOGUARD

## 2020-07-12 LAB — EXTERNAL GENERIC LAB PROCEDURE: COLOGUARD: NEGATIVE

## 2020-07-12 LAB — COLOGUARD
COLOGUARD: NEGATIVE
Cologuard: NEGATIVE

## 2020-07-14 ENCOUNTER — Encounter: Payer: Self-pay | Admitting: Internal Medicine

## 2020-08-15 ENCOUNTER — Ambulatory Visit: Payer: BC Managed Care – PPO | Admitting: Family Medicine

## 2020-09-03 ENCOUNTER — Other Ambulatory Visit: Payer: Self-pay

## 2020-09-03 ENCOUNTER — Encounter: Payer: Self-pay | Admitting: Family Medicine

## 2020-09-03 ENCOUNTER — Telehealth: Payer: Self-pay | Admitting: Internal Medicine

## 2020-09-03 ENCOUNTER — Ambulatory Visit: Payer: BC Managed Care – PPO | Admitting: Family Medicine

## 2020-09-03 ENCOUNTER — Ambulatory Visit (INDEPENDENT_AMBULATORY_CARE_PROVIDER_SITE_OTHER): Payer: BC Managed Care – PPO

## 2020-09-03 ENCOUNTER — Ambulatory Visit: Payer: Self-pay

## 2020-09-03 VITALS — BP 120/90 | HR 96 | Ht 71.0 in | Wt 218.0 lb

## 2020-09-03 DIAGNOSIS — M25511 Pain in right shoulder: Secondary | ICD-10-CM

## 2020-09-03 DIAGNOSIS — G8929 Other chronic pain: Secondary | ICD-10-CM

## 2020-09-03 DIAGNOSIS — H8113 Benign paroxysmal vertigo, bilateral: Secondary | ICD-10-CM

## 2020-09-03 MED ORDER — MELOXICAM 7.5 MG PO TABS
7.5000 mg | ORAL_TABLET | Freq: Every day | ORAL | 0 refills | Status: DC
Start: 2020-09-03 — End: 2020-09-29

## 2020-09-03 NOTE — Progress Notes (Signed)
[place] [address] [place] Phone: [phone] Subjective:   I [name] am serving as a Education administrator for Dr. Hulan Saas.  This visit occurred during the SARS-CoV-2 public health emergency.  Safety protocols were in place, including screening questions prior to the visit, additional usage of staff PPE, and extensive cleaning of exam room while observing appropriate contact time as indicated for disinfecting solutions.   I'm seeing this patient by the request  of:  Janith Lima, MD  CC: Right arm pain  [id number]   02/11/2020 Discussed with patient in great length.  Discussed icing regimen and home exercises, we discussed that a counterforce brace instead of the wrist brace secondary to patient not feeling like making any significant improvement at the moment.  We discussed which activities to do which will avoid.  Increase activity slowly.  Refilled the meloxicam.  Follow-up again in 4 to 8 weeks  Update 09/03/2020 SHALITHA BELLUS is a 54 y.o. female coming in with complaint of right elbow pain. States the right shoulder has been painful today. ROM is limited. States she feels a catch with certain motions. Has tried voltaren gel and icy hot. 5/10 at its worse. Pain radiates to her shoulder blade. Entire arm aches at times. Arm is weak at times. Doesn't remember MOI. Been bothering her for about a month or 2.  Patient states he recently started having increasing elbow pain again but not as severe.      Past Medical History:  Diagnosis Date  . Abdominal pain, RLQ   . Right ovarian cyst    Past Surgical History:  Procedure Laterality Date  . CHOLECYSTECTOMY  1990  . CYSTOSCOPY  02/17/2012   Procedure: CYSTOSCOPY;  Surgeon: Darlyn Chamber, MD;  Location: [facility];  Service: Gynecology;;  . DIAGNOSTIC LAPAROSCOPY  1998   ENDOMETRIOSIS  . GANGLION CYST REMOVED  X3   LAST ONE 1989   LEFT WRIST  . LIPOMA REMOVED   2008   RIGHT PALM  . MINI GASTRIC BYPASS  2009  . NASAL SINUS SURGERY  X3    LAST ONE 1996  . ORIF RIGHT ELBOW FX  2004  . PUBOVAGINAL SLING  2001  . 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 TONSILLECTOMY  1975  . VAGINAL HYSTERECTOMY  1999   Social History   Socioeconomic History  . Marital status: Married    Spouse name: Not on file  . Number of children: Not on file  . Years of education: Not on file  . Highest education level: Not on file  Occupational History  . Not on file  Tobacco Use  . Smoking status: Never Smoker  . Smokeless tobacco: Never Used  Vaping Use  . Vaping Use: Never used  Substance and Sexual Activity  . Alcohol use: Yes    Comment: OCCASIONAL  . Drug use: No  . Sexual activity: Yes    Partners: Male  Other Topics Concern  . Not on file  Social History Narrative   Regular exercise: yes, walk 20 min daily   Caffeine use: 1 cup of coffee daily   Social Determinants of Health   Financial Resource Strain: Not on file  Food Insecurity: Not on file  Transportation Needs: Not on file  Physical Activity: Not on file  Stress: Not on file  Social Connections: Not on file   Allergies  Allergen Reactions  . Codeine Hives   Family History  Problem Relation Age of Onset  . Heart failure Mother   . Heart failure Father   . Hypertension Father   . Diabetes Father   . Cancer Other       Current Outpatient Medications (Respiratory):  .  levocetirizine (XYZAL) 5 MG tablet, Take 2 tablets (10 mg total) by mouth every evening. .  montelukast (SINGULAIR) 10 MG tablet, Take 1 tablet (10 mg total) by mouth at bedtime.  Current Outpatient Medications (Analgesics):  .  meloxicam (MOBIC) 7.5 MG tablet, Take 1 tablet (7.5 mg total) by mouth daily.  Current Outpatient Medications (Hematological):  [name]  Cyanocobalamin (B-12) 1000 MCG SUBL, Place 2,000 mcg under the tongue daily. .  folic acid (FOLVITE) 1 MG tablet, Take 1 tablet (1 mg total) by mouth daily. .  iron  polysaccharides (NIFEREX) 150 MG capsule, Take 1 capsule (150 mg total) by mouth daily.  Current Outpatient Medications (Other):  .  b complex vitamins capsule, Take 1 capsule by mouth daily. .  diazepam (VALIUM) 5 MG tablet, Take 1 tablet (5 mg total) by mouth every 6 (six) hours as needed for anxiety. Pt takes prn for vertigo .  meclizine (ANTIVERT) 25 MG tablet, Take 1 tablet (25 mg total) by mouth 3 (three) times daily as needed for dizziness. Taken prn .  Multiple Vitamin (MULTIVITAMIN) tablet, Take 1 tablet by mouth 2 (two) times daily. .  Vitamin D, Ergocalciferol, (DRISDOL) 1.25 MG (50000 UT) CAPS capsule, Take 100,000 units 5/7 days, and 50,000 units 2/7 days.   Reviewed prior external information including notes and imaging from  primary care provider As well as notes that were available from care everywhere and other healthcare systems.  Past medical history, social, surgical and family history all reviewed in electronic medical record.  No pertanent information unless stated regarding to the chief complaint.   Review of Systems:  No headache, visual changes, nausea, vomiting, diarrhea, constipation, dizziness, abdominal pain, skin rash, fevers, chills, night sweats, weight loss, swollen lymph nodes, body aches, joint swelling, chest pain, shortness of breath, mood changes. POSITIVE muscle aches  Objective  Blood pressure 120/90, pulse 96, height 5\' 11"  (1.803 m), weight 218 lb (98.9 kg), SpO2 96 %.   General: No apparent distress alert and oriented x3 mood and affect normal, dressed appropriately.  HEENT: Pupils equal, extraocular movements intact  Respiratory: Patient's speak in full sentences and does not appear short of breath  Cardiovascular: No lower extremity edema, non tender, no erythema  Right elbow exam shows some very minimal pain over the lateral epicondylar region.  No weakness noted.  Good grip strength. Right shoulder exam positive Hawkins and Neer's.  Patient  does have near full range of motion may be lacking the last 5 degrees of forward flexion.  Mild pain with internal and external range of motion of the shoulder.  Patient i mild limitation actually with internal range of motion to the sacrum.  Mild positive crossover.  Limited musculoskeletal ultrasound was performed and interpreted by Lyndal Pulley  Limited ultrasound of patient's right shoulder shows the patient does have hypoechoic changes within the subacromial bursa does have some irregularity noted of the supraspinatus but not a true acute tear.  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  This included exercises focusing on stretching, strengthening, with significant focus on eccentric aspects.   Long  term goals include an improvement in range of motion, strength, endurance as well as avoiding reinjury. Patient's frequency would include in 1-2 times a day, 3-5 times a week for a duration of 6-12 weeks. Shoulder Exercises that included:  Basic scapular stabilization to include adduction and depression of scapula Scaption, focusing on proper movement and good control Internal and External rotation utilizing a theraband, with elbow tucked at side entire time Rows with theraband   Proper technique shown and discussed handout in great detail with ATC.  All questions were discussed and answered.      Impression and Recommendations:     The above documentation has been reviewed and is accurate and complete Judi Saa, DO

## 2020-09-03 NOTE — Patient Instructions (Addendum)
Good to see you Meloxicam 7.5  Do not use NSAIDS such as Advil or Aleve when taking Meloxicam It is ok to use Tylenol for additional pain relief Voltaren gel and ice Keep hands within peripheral vision  See me again in 5-6 weeks if not better PT or injection

## 2020-09-03 NOTE — Assessment & Plan Note (Signed)
Been going over 2 months she states.  Does not remember any true injury.  Discussed with patient that on ultrasound it does appear that patient does have a subacromial bursitis as well as the tendinitis.  Possible thickening of the anterior capsule that is consistent with a potential early frozen shoulder.  Rotator cuff has some mild changes noted but no true acute tear appreciated.  Patient does have positive impingement.  Discussed with patient at this point she would like to try the meloxicam 7.5 daily we discussed icing regimen, topical anti-inflammatories and home exercises.  X-rays pending.  Differential includes cervical radiculopathy.  Follow-up again in 4 to 6 weeks.  Continue pain consider injection as well as formal physical therapy

## 2020-09-04 NOTE — Telephone Encounter (Signed)
Last RF per controlled database: 10/23/19 Last OV: 10/23/19 Next OV: None scheduled

## 2020-09-28 ENCOUNTER — Other Ambulatory Visit: Payer: Self-pay | Admitting: Family Medicine

## 2020-10-10 ENCOUNTER — Ambulatory Visit: Payer: Self-pay

## 2020-10-10 ENCOUNTER — Other Ambulatory Visit: Payer: Self-pay

## 2020-10-10 ENCOUNTER — Encounter: Payer: Self-pay | Admitting: Family Medicine

## 2020-10-10 ENCOUNTER — Ambulatory Visit (INDEPENDENT_AMBULATORY_CARE_PROVIDER_SITE_OTHER): Payer: BC Managed Care – PPO | Admitting: Family Medicine

## 2020-10-10 VITALS — BP 102/64 | HR 80 | Ht 71.0 in | Wt 217.0 lb

## 2020-10-10 DIAGNOSIS — M25511 Pain in right shoulder: Secondary | ICD-10-CM

## 2020-10-10 DIAGNOSIS — G8929 Other chronic pain: Secondary | ICD-10-CM

## 2020-10-10 NOTE — Progress Notes (Signed)
[name] Phone: [phone] Subjective:   Sydney [name], am serving as a scribe for Dr. Hulan Saas. This visit occurred during the SARS-CoV-2 public health emergency.  Safety protocols were in place, including screening questions prior to the visit, additional usage of staff PPE, and extensive cleaning of exam room while observing appropriate contact time as indicated for disinfecting solutions.   I'm seeing this patient by the request  of:  Janith Lima, MD  CC: Shoulder pain follow-up   [id number]   09/03/2020 Been going over 2 months she states.  Does not remember any true injury.  Discussed with patient that on ultrasound it does appear that patient does have a subacromial bursitis as well as the tendinitis.  Possible thickening of the anterior capsule that is consistent with a potential early frozen shoulder.  Rotator cuff has some mild changes noted but [name] true acute tear appreciated.  Patient does have positive impingement.  Discussed with patient at this point she would like to try the meloxicam 7.5 daily we discussed icing regimen, topical anti-inflammatories and home exercises.  X-rays pending.  Differential includes cervical radiculopathy.  Follow-up again in 4 to 6 weeks.  Continue pain consider injection as well as formal physical therapy   Update 10/10/2020 Sydney [name] is a 55 y.o. female coming in with complaint of right shoulder pain. Pain has improved. Feels that meloxicam helped. Feels pain and resistance with shoulder abduction. Achy pain when she lies on that shoulder.  Patient states that she feels approximately 45% better.  Still has significant limited range of motion.      Past Medical History:  Diagnosis Date  . Abdominal pain, RLQ   . Right ovarian cyst    Past Surgical History:  Procedure Laterality Date  . CHOLECYSTECTOMY  1990  . CYSTOSCOPY  02/17/2012   Procedure:  CYSTOSCOPY;  Surgeon: Darlyn Chamber, MD;  Location: [facility];  Service: Gynecology;;  . DIAGNOSTIC LAPAROSCOPY  1998   ENDOMETRIOSIS  . GANGLION CYST REMOVED  X3   LAST ONE 1989   LEFT WRIST  . LIPOMA REMOVED  2008   RIGHT PALM  . MINI GASTRIC BYPASS  2009  . NASAL SINUS SURGERY  X3    LAST ONE 1996  . ORIF RIGHT ELBOW FX  2004  . PUBOVAGINAL SLING  2001  . 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 TONSILLECTOMY  1975  . VAGINAL HYSTERECTOMY  1999   Social History   Socioeconomic History  . Marital status: Married    Spouse name: Not on file  . Number of children: Not on file  . Years of education: Not on file  . Highest education level: Not on file  Occupational History  . Not on file  Tobacco Use  . Smoking status: Never Smoker  . Smokeless tobacco: Never Used  Vaping Use  . Vaping Use: Never used  Substance and Sexual Activity  . Alcohol use: Yes    Comment: OCCASIONAL  . Drug use: [name]  . Sexual activity: Yes    Partners: Male  Other Topics Concern  . Not on file  Social History Narrative   Regular exercise: yes, walk 20 min daily   Caffeine use: 1 cup of coffee daily   Social Determinants of Health   Financial Resource Strain: Not on file  Food Insecurity: Not on file  Transportation Needs: Not on file  Physical Activity:  Not on file  Stress: Not on file  Social Connections: Not on file   Allergies  Allergen Reactions  . Codeine Hives   Family History  Problem Relation Age of Onset  . Heart failure Mother   . Heart failure Father   . Hypertension Father   . Diabetes Father   . Cancer Other       Current Outpatient Medications (Respiratory):  .  levocetirizine (XYZAL) 5 MG tablet, Take 2 tablets (10 mg total) by mouth every evening. .  montelukast (SINGULAIR) 10 MG tablet, Take 1 tablet (10 mg total) by mouth at bedtime.  Current Outpatient Medications (Analgesics):  .  meloxicam (MOBIC) 7.5 MG tablet, TAKE 1 TABLET(7.5 MG) BY MOUTH  DAILY  Current Outpatient Medications (Hematological):  [name]  Cyanocobalamin (B-12) 1000 MCG SUBL, Place 2,000 mcg under the tongue daily. .  folic acid (FOLVITE) 1 MG tablet, Take 1 tablet (1 mg total) by mouth daily. .  iron polysaccharides (NIFEREX) 150 MG capsule, Take 1 capsule (150 mg total) by mouth daily.  Current Outpatient Medications (Other):  .  b complex vitamins capsule, Take 1 capsule by mouth daily. .  diazepam (VALIUM) 5 MG tablet, TAKE 1 TABLET(5 MG) BY MOUTH EVERY 6 HOURS AS NEEDED FOR ANXIETY. PATIENT TAKE AS NEEDED FOR VERTIGO .  meclizine (ANTIVERT) 25 MG tablet, Take 1 tablet (25 mg total) by mouth 3 (three) times daily as needed for dizziness. Taken prn .  Multiple Vitamin (MULTIVITAMIN) tablet, Take 1 tablet by mouth 2 (two) times daily. .  Vitamin D, Ergocalciferol, (DRISDOL) 1.25 MG (50000 UT) CAPS capsule, Take 100,000 units 5/7 days, and 50,000 units 2/7 days.   Reviewed prior external information including notes and imaging from  primary care provider As well as notes that were available from care everywhere and other healthcare systems.  Past medical history, social, surgical and family history all reviewed in electronic medical record.  [name] pertanent information unless stated regarding to the chief complaint.   Review of Systems:  [name] headache, visual changes, nausea, vomiting, diarrhea, constipation, dizziness, abdominal pain, skin rash, fevers, chills, night sweats, weight loss, swollen lymph nodes, body aches, joint swelling, chest pain, shortness of breath, mood changes. POSITIVE muscle aches  Objective  Blood pressure 102/64, pulse 80, height 5\' 11"  (1.803 m), weight 217 lb (98.4 kg), SpO2 95 %.   General: [name] apparent distress alert and oriented x3 mood and affect normal, dressed appropriately.  HEENT: Pupils equal, extraocular movements intact  Respiratory: Patient's speak in full sentences and does not appear short of breath  Cardiovascular: [name] lower  extremity edema, non tender, [name] erythema  Gait normal with good balance and coordination.  MSK: Right shoulder exam actually shows significant decrease in range of motion.  Forward flexion of 120 degrees, internal rotation to only lateral hip, external rotation of 5 degrees.  Patient even has some weakness of the rotator cuff with 4 out of 5 strength compared to the previous exam. Contralateral shoulder unremarkable.   Impression and Recommendations:     The above documentation has been reviewed and is accurate and complete Lyndal Pulley, DO

## 2020-10-10 NOTE — Assessment & Plan Note (Signed)
Patient did state that she was feeling somewhat better.  Unfortunately on exam she has had increasing weakness noted today of the rotator cuff as well as patient has had decreasing range of motion in internal and external range of motion.  This can still be associated with potentially frozen shoulder but I am concerned that this could be a rotator cuff tear that could be contributing to this as well.  Patient does have some history of claustrophobia but does have a prescription for the Valium which she will take before the test.  Depending on findings we will discuss if we can still continue conservative therapy or patient will need surgical intervention.  Patient is in agreement with the plan.

## 2020-10-10 NOTE — Patient Instructions (Signed)
Good to see you MRA of the right shoulder Will contact you on Mychart about results and next steps

## 2020-10-28 ENCOUNTER — Other Ambulatory Visit: Payer: Self-pay | Admitting: Family Medicine

## 2020-11-04 ENCOUNTER — Ambulatory Visit
Admission: RE | Admit: 2020-11-04 | Discharge: 2020-11-04 | Disposition: A | Discharge status: Home / Self Care | Payer: BC Managed Care – PPO | Source: Ambulatory Visit | Attending: Family Medicine | Admitting: Family Medicine

## 2020-11-04 ENCOUNTER — Other Ambulatory Visit: Payer: Self-pay

## 2020-11-04 DIAGNOSIS — M19011 Primary osteoarthritis, right shoulder: Secondary | ICD-10-CM | POA: Diagnosis not present

## 2020-11-04 DIAGNOSIS — G8929 Other chronic pain: Secondary | ICD-10-CM | POA: Diagnosis not present

## 2020-11-04 DIAGNOSIS — M25511 Pain in right shoulder: Secondary | ICD-10-CM | POA: Diagnosis not present

## 2020-11-04 MED ORDER — IOPAMIDOL (ISOVUE-M 200) INJECTION 41%
12.0000 mL | Freq: Once | INTRAMUSCULAR | Status: AC
  Administered 2020-11-04: 12 mL via INTRA_ARTICULAR

## 2020-11-05 ENCOUNTER — Encounter: Payer: Self-pay | Admitting: Family Medicine

## 2020-11-05 NOTE — Progress Notes (Signed)
[place] [address] [place] Phone: [phone] Subjective:   I [name] am serving as a Education administrator for Dr. Hulan Saas.  This visit occurred during the SARS-CoV-2 public health emergency.  Safety protocols were in place, including screening questions prior to the visit, additional usage of staff PPE, and extensive cleaning of exam room while observing appropriate contact time as indicated for disinfecting solutions.   I'm seeing this patient by the request  of:  Janith Lima, MD  CC: Right shoulder pain follow-up  [id number]  Sydney [name] is a 55 y.o. female coming in with complaint of right shoulder pain. Patient is here for injection today for RTC tear. Patient states she would like a shoulder injection. States she has been avoiding it as much as she can. Does not want surgery.   MRI right shoulder 11/04/2020  IMPRESSION: Rotator cuff tendinopathy is most notable in the supraspinatus where there is a 0.6 cm from front to back interstitial tear.  Moderate acromioclavicular osteoarthritis.  Small volume of subacromial/subdeltoid fluid compatible with bursitis.       Past Medical History:  Diagnosis Date  . Abdominal pain, RLQ   . Right ovarian cyst    Past Surgical History:  Procedure Laterality Date  . CHOLECYSTECTOMY  1990  . CYSTOSCOPY  02/17/2012   Procedure: CYSTOSCOPY;  Surgeon: Darlyn Chamber, MD;  Location: [facility];  Service: Gynecology;;  . DIAGNOSTIC LAPAROSCOPY  1998   ENDOMETRIOSIS  . GANGLION CYST REMOVED  X3   LAST ONE 1989   LEFT WRIST  . LIPOMA REMOVED  2008   RIGHT PALM  . MINI GASTRIC BYPASS  2009  . NASAL SINUS SURGERY  X3    LAST ONE 1996  . ORIF RIGHT ELBOW FX  2004  . PUBOVAGINAL SLING  2001  . 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 TONSILLECTOMY  1975  . VAGINAL HYSTERECTOMY  1999   Social History   Socioeconomic History  . Marital status: Married    Spouse  name: Not on file  . Number of children: Not on file  . Years of education: Not on file  . Highest education level: Not on file  Occupational History  . Not on file  Tobacco Use  . Smoking status: Never Smoker  . Smokeless tobacco: Never Used  Vaping Use  . Vaping Use: Never used  Substance and Sexual Activity  . Alcohol use: Yes    Comment: OCCASIONAL  . Drug use: No  . Sexual activity: Yes    Partners: Male  Other Topics Concern  . Not on file  Social History Narrative   Regular exercise: yes, walk 20 min daily   Caffeine use: 1 cup of coffee daily   Social Determinants of Health   Financial Resource Strain: Not on file  Food Insecurity: Not on file  Transportation Needs: Not on file  Physical Activity: Not on file  Stress: Not on file  Social Connections: Not on file   Allergies  Allergen Reactions  . Codeine Hives   Family History  Problem Relation Age of Onset  . Heart failure Mother   . Heart failure Father   . Hypertension Father   . Diabetes Father   . Cancer Other       Current Outpatient Medications (Respiratory):  .  levocetirizine (XYZAL) 5 MG tablet, Take 2 tablets (10 mg total) by mouth every evening. .  montelukast (SINGULAIR) 10  MG tablet, Take 1 tablet (10 mg total) by mouth at bedtime.  Current Outpatient Medications (Analgesics):  .  meloxicam (MOBIC) 7.5 MG tablet, TAKE 1 TABLET(7.5 MG) BY MOUTH DAILY  Current Outpatient Medications (Hematological):  [name]  Cyanocobalamin (B-12) 1000 MCG SUBL, Place 2,000 mcg under the tongue daily. .  folic acid (FOLVITE) 1 MG tablet, Take 1 tablet (1 mg total) by mouth daily. .  iron polysaccharides (NIFEREX) 150 MG capsule, Take 1 capsule (150 mg total) by mouth daily.  Current Outpatient Medications (Other):  .  b complex vitamins capsule, Take 1 capsule by mouth daily. .  diazepam (VALIUM) 5 MG tablet, TAKE 1 TABLET(5 MG) BY MOUTH EVERY 6 HOURS AS NEEDED FOR ANXIETY. PATIENT TAKE AS NEEDED FOR  VERTIGO .  meclizine (ANTIVERT) 25 MG tablet, Take 1 tablet (25 mg total) by mouth 3 (three) times daily as needed for dizziness. Taken prn .  Multiple Vitamin (MULTIVITAMIN) tablet, Take 1 tablet by mouth 2 (two) times daily. .  Vitamin D, Ergocalciferol, (DRISDOL) 1.25 MG (50000 UT) CAPS capsule, Take 100,000 units 5/7 days, and 50,000 units 2/7 days.   Reviewed prior external information including notes and imaging from  primary care provider As well as notes that were available from care everywhere and other healthcare systems.  Past medical history, social, surgical and family history all reviewed in electronic medical record.  No pertanent information unless stated regarding to the chief complaint.   Review of Systems:  No headache, visual changes, nausea, vomiting, diarrhea, constipation, dizziness, abdominal pain, skin rash, fevers, chills, night sweats, weight loss, swollen lymph nodes, body aches, joint swelling, chest pain, shortness of breath, mood changes. POSITIVE muscle aches  Objective  Blood pressure 110/76, pulse 66, height 5\' 11"  (1.803 m), weight 215 lb (97.5 kg), SpO2 98 %.   General: No apparent distress alert and oriented x3 mood and affect normal, dressed appropriately.  HEENT: Pupils equal, extraocular movements intact  Respiratory: Patient's speak in full sentences and does not appear short of breath  Cardiovascular: No lower extremity edema, non tender, no erythema  Gait normal with good balance and coordination.  MSK: Right shoulder shows the patient does have some positive impingement with Neer and Hawkins.  Rotator cuff strength though does have 4-5 compared to the contralateral side.  Patient does have mild positive crossover test.  Procedure: Real-time Ultrasound Guided Injection of right acromioclavicular joint Device: GE Logiq Q7 Ultrasound guided injection is preferred based studies that show increased duration, increased effect, greater accuracy,  decreased procedural pain, increased response rate, and decreased cost with ultrasound guided versus blind injection.  Verbal informed consent obtained.  Time-out conducted.  Noted no overlying erythema, induration, or other signs of local infection.  Skin prepped in a sterile fashion.  Local anesthesia: Topical Ethyl chloride.  With sterile technique and under real time ultrasound guidance: With a 25-gauge half inch needle injected with 0.5 cc of 0.5% Marcaine and 0.5 cc of Kenalog 40 mg/mL Completed without difficulty  Pain immediately improved suggesting accurate placement of the medication.  Advised to call if fevers/chills, erythema, induration, drainage, or persistent bleeding.  Impression: Technically successful ultrasound guided injection.  Procedure: Real-time Ultrasound Guided Injection of right glenohumeral joint Device: GE Logiq Q7  Ultrasound guided injection is preferred based studies that show increased duration, increased effect, greater accuracy, decreased procedural pain, increased response rate with ultrasound guided versus blind injection.  Verbal informed consent obtained.  Time-out conducted.  Noted no overlying erythema, induration, or  other signs of local infection.  Skin prepped in a sterile fashion.  Local anesthesia: Topical Ethyl chloride.  With sterile technique and under real time ultrasound guidance:  Joint visualized.  23g 1  inch needle inserted posterior approach. Pictures taken for needle placement. Patient did have injection of, 2 cc of 0.5% Marcaine, and 1.0 cc of Kenalog 40 mg/dL. Completed without difficulty  Pain immediately resolved suggesting accurate placement of the medication.  Advised to call if fevers/chills, erythema, induration, drainage, or persistent bleeding.  Impression: Technically successful ultrasound guided injection.    Impression and Recommendations:     The above documentation has been reviewed and is accurate and complete  Lyndal Pulley, DO

## 2020-11-06 ENCOUNTER — Ambulatory Visit: Payer: BC Managed Care – PPO | Admitting: Family Medicine

## 2020-11-06 ENCOUNTER — Other Ambulatory Visit: Payer: Self-pay

## 2020-11-06 ENCOUNTER — Ambulatory Visit: Payer: Self-pay

## 2020-11-06 ENCOUNTER — Encounter: Payer: Self-pay | Admitting: Family Medicine

## 2020-11-06 VITALS — BP 110/76 | HR 66 | Ht 71.0 in | Wt 215.0 lb

## 2020-11-06 DIAGNOSIS — G8929 Other chronic pain: Secondary | ICD-10-CM | POA: Diagnosis not present

## 2020-11-06 DIAGNOSIS — M25511 Pain in right shoulder: Secondary | ICD-10-CM

## 2020-11-06 DIAGNOSIS — M19019 Primary osteoarthritis, unspecified shoulder: Secondary | ICD-10-CM | POA: Insufficient documentation

## 2020-11-06 DIAGNOSIS — M75101 Unspecified rotator cuff tear or rupture of right shoulder, not specified as traumatic: Secondary | ICD-10-CM | POA: Diagnosis not present

## 2020-11-06 DIAGNOSIS — M19011 Primary osteoarthritis, right shoulder: Secondary | ICD-10-CM | POA: Diagnosis not present

## 2020-11-06 NOTE — Assessment & Plan Note (Signed)
No true large tear noted.  Patient responded well to the injection.  Discussed icing regimen and home exercises, discussed which activities to do which wants to avoid.  Increase activity as tolerated starting next week.  Follow-up with me again 6 weeks to make sure patient is improving.  Worsening pain does need to consider the possibility of formal physical therapy or PRP.

## 2020-11-06 NOTE — Patient Instructions (Signed)
Good to see you Tried both shoulder injections today Take it easy for the weekend then restart exercises Ice the shoulder Keep hands within peripheral vision Worsening pain, redness, or swelling seek medical attention See me again in 6 weeks

## 2020-11-06 NOTE — Assessment & Plan Note (Signed)
Patient given injection and tolerated the procedure well.  Discussed icing regimen and home exercises, moderate arthritic changes on MRI.  Likely will do well with conservative therapy though.

## 2020-11-26 ENCOUNTER — Other Ambulatory Visit: Payer: Self-pay | Admitting: Family Medicine

## 2020-12-04 ENCOUNTER — Other Ambulatory Visit: Payer: Self-pay | Admitting: Family Medicine

## 2020-12-04 ENCOUNTER — Other Ambulatory Visit: Payer: Self-pay | Admitting: Internal Medicine

## 2020-12-04 DIAGNOSIS — E538 Deficiency of other specified B group vitamins: Secondary | ICD-10-CM

## 2020-12-04 DIAGNOSIS — H8113 Benign paroxysmal vertigo, bilateral: Secondary | ICD-10-CM

## 2020-12-17 NOTE — Progress Notes (Deleted)
[place] Phone: [phone] Subjective:    I'm seeing this patient by the request  of:  Janith Lima, MD  CC: Right shoulder pain follow-up  OAC:[id number]   11/06/2020 Patient given injection and tolerated the procedure well.  Discussed icing regimen and home exercises, moderate arthritic changes on MRI.  Likely will do well with conservative therapy though.  No true large tear noted.  Patient responded well to the injection.  Discussed icing regimen and home exercises, discussed which activities to do which wants to avoid.  Increase activity as tolerated starting next week.  Follow-up with me again 6 weeks to make sure patient is improving.  Worsening pain does need to consider the possibility of formal physical therapy or PRP.  Update 12/18/2020 NAYLENE FOELL is a 55 y.o. female coming in with complaint of right shoulder follow-up.  Patient was given injections November 06, 2020 both her and glenohumeral joint and the acromioclavicular joint.  Patient's MRI did show very small intrasubstance tear noted of the supraspinatus.  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 Abdominal pain, RLQ   . Right ovarian cyst    Past Surgical History:  Procedure Laterality Date  . CHOLECYSTECTOMY  1990  . CYSTOSCOPY  02/17/2012   Procedure: CYSTOSCOPY;  Surgeon: Darlyn Chamber, MD;  Location: [facility];  Service: Gynecology;;  . DIAGNOSTIC LAPAROSCOPY  1998   ENDOMETRIOSIS  . GANGLION CYST REMOVED  X3   LAST ONE 1989   LEFT WRIST  . LIPOMA REMOVED  2008   RIGHT PALM  . MINI GASTRIC BYPASS  2009  . NASAL SINUS SURGERY  X3    LAST ONE 1996  . ORIF RIGHT ELBOW FX  2004  . PUBOVAGINAL SLING  2001  . 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 TONSILLECTOMY  1975  .  VAGINAL HYSTERECTOMY  1999   Social History   Socioeconomic History  . Marital status: Married    Spouse name: Not on file  . Number of children: Not on file  . Years of education: Not on file  . Highest education level: Not on file  Occupational History  . Not on file  Tobacco Use  . Smoking status: Never Smoker  . Smokeless tobacco: Never Used  Vaping Use  . Vaping Use: Never used  Substance and Sexual Activity  . Alcohol use: Yes    Comment: OCCASIONAL  . Drug use: No  . Sexual activity: Yes    Partners: Male  Other Topics Concern  . Not on file  Social History Narrative   Regular exercise: yes, walk 20 min daily   Caffeine use: 1 cup of coffee daily   Social Determinants of Health   Financial Resource Strain: Not on file  Food Insecurity: Not on file  Transportation Needs: Not on file  Physical Activity: Not on file  Stress: Not on file  Social Connections: Not on file   Allergies  Allergen Reactions  . Codeine Hives   Family History  Problem Relation Age of Onset  . Heart failure Mother   . Heart failure Father   . Hypertension Father   . Diabetes Father   . Cancer Other       Current Outpatient Medications (Respiratory):  .  levocetirizine (XYZAL) 5 MG tablet, Take 2 tablets (10 mg total) by mouth every evening. .  montelukast (SINGULAIR) 10 MG tablet, Take 1 tablet (10 mg total) by mouth at bedtime.  Current Outpatient Medications (Analgesics):  .  meloxicam (MOBIC) 7.5 MG tablet, TAKE 1 TABLET(7.5 MG) BY MOUTH DAILY  Current Outpatient Medications (Hematological):  [name]  Cyanocobalamin (B-12) 1000 MCG SUBL, Place 2,000 mcg under the tongue daily. .  folic acid (FOLVITE) 1 MG tablet, TAKE 1 TABLET(1 MG) BY MOUTH DAILY .  iron polysaccharides (NIFEREX) 150 MG capsule, Take 1 capsule (150 mg total) by mouth daily.  Current Outpatient Medications (Other):  .  b complex vitamins capsule, Take 1 capsule by mouth daily. .  diazepam (VALIUM) 5 MG  tablet, TAKE 1 TABLET(5 MG) BY MOUTH EVERY 6 HOURS AS NEEDED FOR ANXIETY. PATIENT TAKE AS NEEDED FOR VERTIGO .  meclizine (ANTIVERT) 25 MG tablet, TAKE 1 TABLET BY MOUTH THREE TIMES DAILY AS NEEDED FOR DIZZINESS .  Multiple Vitamin (MULTIVITAMIN) tablet, Take 1 tablet by mouth 2 (two) times daily. .  Vitamin D, Ergocalciferol, (DRISDOL) 1.25 MG (50000 UT) CAPS capsule, Take 100,000 units 5/7 days, and 50,000 units 2/7 days.   Reviewed prior external information including notes and imaging from  primary care provider As well as notes that were available from care everywhere and other healthcare systems.  Past medical history, social, surgical and family history all reviewed in electronic medical record.  No pertanent information unless stated regarding to the chief complaint.   Review of Systems:  No headache, visual changes, nausea, vomiting, diarrhea, constipation, dizziness, abdominal pain, skin rash, fevers, chills, night sweats, weight loss, swollen lymph nodes, body aches, joint swelling, chest pain, shortness of breath, mood changes. POSITIVE muscle aches  Objective  There were no vitals taken for this visit.   General: No apparent distress alert and oriented x3 mood and affect normal, dressed appropriately.  HEENT: Pupils equal, extraocular movements intact  Respiratory: Patient's speak in full sentences and does not appear short of breath  Cardiovascular: No lower extremity edema, non tender, no erythema  Gait normal with good balance and coordination.  MSK:  Non tender with full range of motion and good stability and symmetric strength and tone of shoulders, elbows, wrist, hip, knee and ankles bilaterally.     Impression and Recommendations:     The above documentation has been reviewed and is accurate and complete Lyndal Pulley, DO

## 2020-12-18 ENCOUNTER — Ambulatory Visit: Payer: BC Managed Care – PPO | Admitting: Family Medicine

## 2020-12-25 NOTE — Progress Notes (Signed)
[place] [address] [place] Phone: [phone] Subjective:   I Sydney [name] am serving as a Education administrator for Dr. Hulan [name].  This visit occurred during the SARS-CoV-2 public health emergency.  Safety protocols were in place, including screening questions prior to the visit, additional usage of staff PPE, and extensive cleaning of exam room while observing appropriate contact time as indicated for disinfecting solutions.   I'[name] seeing this patient by the request  of:  Sydney Lima, MD  CC: Right shoulder pain  JTT:[id number]   11/06/2020 Patient given injection and tolerated the procedure well.  Discussed icing regimen and home exercises, moderate arthritic changes on MRI.  Likely will do well with conservative therapy though.  No true large tear noted.  Patient responded well to the injection.  Discussed icing regimen and home exercises, discussed which activities to do which wants to avoid.  Increase activity as tolerated starting next week.  Follow-up with me again 6 weeks to make sure patient is improving.  Worsening pain does need to consider the possibility of formal physical therapy or PRP.  Update 12/26/2020 Sydney [name] is a 55 y.o. female coming in with complaint of right shoulder pain. Patient states her shoulder is a little better. Certain ROM is painful. States that sometimes she feels a crunchy feeling/sound. Meloxicam is helping.  Patient states that overall she would state 70% better.  Still aggravating at night and does take the meloxicam an hour before bedtime that seems to be beneficial.  No side effects to the medication       Past Medical History:  Diagnosis Date  . Abdominal pain, RLQ   . Right ovarian cyst    Past Surgical History:  Procedure Laterality Date  . CHOLECYSTECTOMY  1990  . CYSTOSCOPY  02/17/2012   Procedure: CYSTOSCOPY;  Surgeon: Sydney Chamber, MD;  Location: [facility];   Service: Gynecology;;  . DIAGNOSTIC LAPAROSCOPY  1998   ENDOMETRIOSIS  . GANGLION CYST REMOVED  X3   LAST ONE 1989   LEFT WRIST  . LIPOMA REMOVED  2008   RIGHT PALM  . MINI GASTRIC BYPASS  2009  . NASAL SINUS SURGERY  X3    LAST ONE 1996  . ORIF RIGHT ELBOW FX  2004  . PUBOVAGINAL SLING  2001  . 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 TONSILLECTOMY  1975  . VAGINAL HYSTERECTOMY  1999   Social History   Socioeconomic History  . Marital status: Married    Spouse name: Not on file  . Number of children: Not on file  . Years of education: Not on file  . Highest education level: Not on file  Occupational History  . Not on file  Tobacco Use  . Smoking status: Never Smoker  . Smokeless tobacco: Never Used  Vaping Use  . Vaping Use: Never used  Substance and Sexual Activity  . Alcohol use: Yes    Comment: OCCASIONAL  . Drug use: No  . Sexual activity: Yes    Partners: Male  Other Topics Concern  . Not on file  Social History Narrative   Regular exercise: yes, walk 20 min daily   Caffeine use: 1 cup of coffee daily   Social Determinants of Health   Financial Resource Strain: Not on file  Food Insecurity: Not on file  Transportation Needs: Not on file  Physical Activity: Not on file  Stress: Not on file  Social  Connections: Not on file   Allergies  Allergen Reactions  . Codeine Hives   Family History  Problem Relation Age of Onset  . Heart failure Mother   . Heart failure Father   . Hypertension Father   . Diabetes Father   . Cancer Other       Current Outpatient Medications (Respiratory):  .  levocetirizine (XYZAL) 5 MG tablet, Take 2 tablets (10 mg total) by mouth every evening. .  montelukast (SINGULAIR) 10 MG tablet, Take 1 tablet (10 mg total) by mouth at bedtime.  Current Outpatient Medications (Analgesics):  .  meloxicam (MOBIC) 7.5 MG tablet, TAKE 1 TABLET(7.5 MG) BY MOUTH DAILY  Current Outpatient Medications (Hematological):  [name]  Cyanocobalamin (B-12)  1000 MCG SUBL, Place 2,000 mcg under the tongue daily. .  folic acid (FOLVITE) 1 MG tablet, TAKE 1 TABLET(1 MG) BY MOUTH DAILY .  iron polysaccharides (NIFEREX) 150 MG capsule, Take 1 capsule (150 mg total) by mouth daily.  Current Outpatient Medications (Other):  .  b complex vitamins capsule, Take 1 capsule by mouth daily. .  diazepam (VALIUM) 5 MG tablet, TAKE 1 TABLET(5 MG) BY MOUTH EVERY 6 HOURS AS NEEDED FOR ANXIETY. PATIENT TAKE AS NEEDED FOR VERTIGO .  meclizine (ANTIVERT) 25 MG tablet, TAKE 1 TABLET BY MOUTH THREE TIMES DAILY AS NEEDED FOR DIZZINESS .  Multiple Vitamin (MULTIVITAMIN) tablet, Take 1 tablet by mouth 2 (two) times daily. .  Vitamin D, Ergocalciferol, (DRISDOL) 1.25 MG (50000 UT) CAPS capsule, Take 100,000 units 5/7 days, and 50,000 units 2/7 days.   Reviewed prior external information including notes and imaging from  primary care provider As well as notes that were available from care everywhere and other healthcare systems.  Past medical history, social, surgical and family history all reviewed in electronic medical record.  No pertanent information unless stated regarding to the chief complaint.   Review of Systems:  No headache, visual changes, nausea, vomiting, diarrhea, constipation, dizziness, abdominal pain, skin rash, fevers, chills, night sweats, weight loss, swollen lymph nodes, body aches, joint swelling, chest pain, shortness of breath, mood changes. POSITIVE muscle aches  Objective  Blood pressure 106/80, pulse 73, height 5\' 11"  (1.803 [name]), weight 214 lb (97.1 kg), SpO2 97 %.   General: No apparent distress alert and oriented x3 mood and affect normal, dressed appropriately.  HEENT: Pupils equal, extraocular movements intact  Respiratory: Patient's speak in full sentences and does not appear short of breath  Cardiovascular: No lower extremity edema, non tender, no erythema  Gait normal with good balance and coordination.  MSK: Right shoulder exam shows  the patient's rotator cuff strength is intact.  Still has positive impingement on noted.  Mild positive crossover.  Limited musculoskeletal ultrasound was performed and interpreted by Sydney [name] Limited ultrasound of patient's right shoulder shows the patient still has some intrasubstance tearing noted of the supraspinatus but no true retraction noted.  Significant decrease in the subacromial bursitis and the hypoechoic changes that were seen initially in the acromioclavicular joint.  Unable to save pictures.     Impression and Recommendations:     The above documentation has been reviewed and is accurate and complete Sydney Pulley, DO

## 2020-12-26 ENCOUNTER — Ambulatory Visit: Payer: BC Managed Care – PPO | Admitting: Family Medicine

## 2020-12-26 ENCOUNTER — Encounter: Payer: Self-pay | Admitting: Family Medicine

## 2020-12-26 ENCOUNTER — Other Ambulatory Visit: Payer: Self-pay

## 2020-12-26 DIAGNOSIS — M75101 Unspecified rotator cuff tear or rupture of right shoulder, not specified as traumatic: Secondary | ICD-10-CM | POA: Diagnosis not present

## 2020-12-26 NOTE — Assessment & Plan Note (Signed)
Patient still has some mild intrasubstance tearing noted on ultrasound.  Unable to save the pictures of today.  We discussed the possibility of PRP.  Patient wants to hold at this time.  Patient will continue with conservative therapy.  Follow-up again in 2 to 3 months.  Can continue the meloxicam if necessary.

## 2020-12-26 NOTE — Patient Instructions (Signed)
Good to see you Can consider PRP  Making progress Continue exercises See me again in 2-3 months

## 2021-01-15 ENCOUNTER — Ambulatory Visit (INDEPENDENT_AMBULATORY_CARE_PROVIDER_SITE_OTHER): Payer: BC Managed Care – PPO | Admitting: Internal Medicine

## 2021-01-15 ENCOUNTER — Encounter: Payer: Self-pay | Admitting: Internal Medicine

## 2021-01-15 ENCOUNTER — Other Ambulatory Visit: Payer: BC Managed Care – PPO

## 2021-01-15 ENCOUNTER — Other Ambulatory Visit: Payer: Self-pay

## 2021-01-15 VITALS — BP 112/72 | HR 82 | Temp 98.0°F | Ht 71.0 in | Wt 210.0 lb

## 2021-01-15 DIAGNOSIS — Z0000 Encounter for general adult medical examination without abnormal findings: Secondary | ICD-10-CM

## 2021-01-15 DIAGNOSIS — E519 Thiamine deficiency, unspecified: Secondary | ICD-10-CM

## 2021-01-15 DIAGNOSIS — E66811 Obesity, class 1: Secondary | ICD-10-CM

## 2021-01-15 DIAGNOSIS — H8113 Benign paroxysmal vertigo, bilateral: Secondary | ICD-10-CM

## 2021-01-15 DIAGNOSIS — E559 Vitamin D deficiency, unspecified: Secondary | ICD-10-CM

## 2021-01-15 DIAGNOSIS — D508 Other iron deficiency anemias: Secondary | ICD-10-CM

## 2021-01-15 DIAGNOSIS — E6609 Other obesity due to excess calories: Secondary | ICD-10-CM

## 2021-01-15 DIAGNOSIS — Z6830 Body mass index (BMI) 30.0-30.9, adult: Secondary | ICD-10-CM

## 2021-01-15 DIAGNOSIS — E538 Deficiency of other specified B group vitamins: Secondary | ICD-10-CM

## 2021-01-15 DIAGNOSIS — N951 Menopausal and female climacteric states: Secondary | ICD-10-CM

## 2021-01-15 DIAGNOSIS — Z9884 Bariatric surgery status: Secondary | ICD-10-CM

## 2021-01-15 LAB — CBC WITH DIFFERENTIAL/PLATELET
Basophils Absolute: 0.1 10*3/uL (ref 0.0–0.1)
Basophils Relative: 1.3 % (ref 0.0–3.0)
Eosinophils Absolute: 0.4 10*3/uL (ref 0.0–0.7)
Eosinophils Relative: 3.1 % (ref 0.0–5.0)
HCT: 39.3 % (ref 36.0–46.0)
Hemoglobin: 13.1 g/dL (ref 12.0–15.0)
Lymphocytes Relative: 37.3 % (ref 12.0–46.0)
Lymphs Abs: 4.2 10*3/uL — ABNORMAL HIGH (ref 0.7–4.0)
MCHC: 33.2 g/dL (ref 30.0–36.0)
MCV: 92.2 fl (ref 78.0–100.0)
Monocytes Absolute: 1.1 10*3/uL — ABNORMAL HIGH (ref 0.1–1.0)
Monocytes Relative: 10 % (ref 3.0–12.0)
Neutro Abs: 5.5 10*3/uL (ref 1.4–7.7)
Neutrophils Relative %: 48.3 % (ref 43.0–77.0)
Platelets: 415 10*3/uL — ABNORMAL HIGH (ref 150.0–400.0)
RBC: 4.26 Mil/uL (ref 3.87–5.11)
RDW: 13.3 % (ref 11.5–15.5)
WBC: 11.3 10*3/uL — ABNORMAL HIGH (ref 4.0–10.5)

## 2021-01-15 LAB — LIPID PANEL
Cholesterol: 162 mg/dL (ref 0–200)
HDL: 48.2 mg/dL (ref >39.00)
LDL Cholesterol: 98 mg/dL (ref 0–99)
NonHDL: 113.61
Total CHOL/HDL Ratio: 3
Triglycerides: 77 mg/dL (ref 0.0–149.0)
VLDL: 15.4 mg/dL (ref 0.0–40.0)

## 2021-01-15 LAB — IRON: Iron: 61 ug/dL (ref 42–145)

## 2021-01-15 LAB — HEMOGLOBIN A1C: Hgb A1c MFr Bld: 5.1 % (ref 4.6–6.5)

## 2021-01-15 MED ORDER — MECLIZINE HCL 25 MG PO TABS: 25.0000 mg | ORAL_TABLET | Freq: Two times a day (BID) | ORAL | 2 refills | Status: DC | PRN

## 2021-01-15 MED ORDER — ESTRADIOL 0.05 MG/24HR TD PTWK: 0.0500 mg | MEDICATED_PATCH | TRANSDERMAL | 0 refills | Status: DC

## 2021-01-15 MED ORDER — SAXENDA 18 MG/3ML ~~LOC~~ SOPN: 3.0000 mg | PEN_INJECTOR | Freq: Every day | SUBCUTANEOUS | 1 refills | Status: DC

## 2021-01-15 MED ORDER — INSULIN PEN NEEDLE 32G X 6 MM MISC: 1.0000 | Freq: Every day | 1 refills | Status: DC

## 2021-01-15 MED ORDER — DIAZEPAM 5 MG PO TABS: 5.0000 mg | ORAL_TABLET | Freq: Two times a day (BID) | ORAL | 1 refills | Status: DC | PRN

## 2021-01-15 NOTE — Progress Notes (Signed)
Subjective:  Patient ID: Sydney [name], female    DOB: [birth year]  Age: 55 y.o. MRN: [id number]  CC: Annual Exam  This visit occurred during the SARS-CoV-2 public health emergency.  Safety protocols were in place, including screening questions prior to the visit, additional usage of staff PPE, and extensive cleaning of exam room while observing appropriate contact time as indicated for disinfecting solutions.    HPI Sydney [name] presents for a CPX and f/up -  She complains of hot flashes and night sweats.  She says the symptoms interfere with her quality of life.  She had a normal mammogram about a year ago and is scheduled soon for another one.  She is having trouble losing weight despite lifestyle modifications.  She continues to complain of intermittent episodes of vertigo and requests a refill of meclizine and diazepam.  She denies headache, visual disturbance, or paresthesias.  Outpatient Medications Prior to Visit  Medication Sig Dispense Refill  . b complex vitamins capsule Take 1 capsule by mouth daily.    . Cyanocobalamin (B-12) 1000 MCG SUBL Place 2,000 mcg under the tongue daily. 30 each 3  . folic acid (FOLVITE) 1 MG tablet TAKE 1 TABLET(1 MG) BY MOUTH DAILY 90 tablet 1  . iron polysaccharides (NIFEREX) 150 MG capsule Take 1 capsule (150 mg total) by mouth daily.    [name] levocetirizine (XYZAL) 5 MG tablet Take 2 tablets (10 mg total) by mouth every evening. 180 tablet 0  . montelukast (SINGULAIR) 10 MG tablet Take 1 tablet (10 mg total) by mouth at bedtime. 90 tablet 0  . Multiple Vitamin (MULTIVITAMIN) tablet Take 1 tablet by mouth 2 (two) times daily.    . diazepam (VALIUM) 5 MG tablet TAKE 1 TABLET(5 MG) BY MOUTH EVERY 6 HOURS AS NEEDED FOR ANXIETY. PATIENT TAKE AS NEEDED FOR VERTIGO 30 tablet 0  . meclizine (ANTIVERT) 25 MG tablet TAKE 1 TABLET BY MOUTH THREE TIMES DAILY AS NEEDED FOR DIZZINESS 65 tablet 2  . meloxicam (MOBIC) 7.5 MG tablet TAKE 1 TABLET(7.5 MG) BY MOUTH  DAILY 30 tablet 0  . Vitamin D, Ergocalciferol, (DRISDOL) 1.25 MG (50000 UT) CAPS capsule Take 100,000 units 5/7 days, and 50,000 units 2/7 days. 100 capsule 3   No facility-administered medications prior to visit.    ROS Review of Systems  Constitutional: Positive for unexpected weight change (wt gain). Negative for appetite change, chills, diaphoresis and fatigue.  HENT: Negative.   Eyes: Negative.  Negative for visual disturbance.  Respiratory: Negative for cough, chest tightness and wheezing.   Cardiovascular: Negative for chest pain, palpitations and leg swelling.  Gastrointestinal: Negative for abdominal pain, constipation, diarrhea, nausea and vomiting.  Endocrine: Negative.   Genitourinary: Negative.  Negative for difficulty urinating.  Musculoskeletal: Negative for arthralgias and myalgias.  Skin: Negative.  Negative for color change and rash.  Neurological: Positive for dizziness. Negative for weakness, light-headedness, numbness and headaches.  Hematological: Negative for adenopathy. Does not bruise/bleed easily.  Psychiatric/Behavioral: Negative.     Objective:  BP 112/72 (BP Location: Left Arm, Patient Position: Sitting, Cuff Size: Large)   Pulse 82   Temp 98 F (36.7 C) (Oral)   Ht 5\' 11"  (1.803 m)   Wt 210 lb (95.3 kg)   SpO2 95%   BMI 29.29 kg/m   BP Readings from Last 3 Encounters:  01/15/21 112/72  12/26/20 106/80  11/06/20 110/76    Wt Readings from Last 3 Encounters:  01/15/21 210 lb (95.3 kg)  12/26/20  214 lb (97.1 kg)  11/06/20 215 lb (97.5 kg)    Physical Exam Vitals reviewed.  Constitutional:      Appearance: Normal appearance.  HENT:     Nose: Nose normal.  Eyes:     General: No scleral icterus.    Conjunctiva/sclera: Conjunctivae normal.  Cardiovascular:     Rate and Rhythm: Normal rate and regular rhythm.     Heart sounds: No murmur heard.   Pulmonary:     Effort: Pulmonary effort is normal.     Breath sounds: No stridor. No  wheezing or rales.  Abdominal:     General: Abdomen is flat.     Palpations: There is no mass.     Tenderness: There is no abdominal tenderness. There is no guarding.  Musculoskeletal:        General: Normal range of motion.     Cervical back: Neck supple.     Right lower leg: No edema.     Left lower leg: No edema.  Lymphadenopathy:     Cervical: No cervical adenopathy.  Skin:    General: Skin is warm and dry.  Neurological:     General: No focal deficit present.     Mental Status: She is alert and oriented to person, place, and time. Mental status is at baseline.  Psychiatric:        Mood and Affect: Mood normal.        Behavior: Behavior normal.     Lab Results  Component Value Date   WBC 11.3 (H) 01/15/2021   HGB 13.1 01/15/2021   HCT 39.3 01/15/2021   PLT 415.0 (H) 01/15/2021   GLUCOSE 90 01/16/2021   CHOL 162 01/15/2021   TRIG 77.0 01/15/2021   HDL 48.20 01/15/2021   LDLCALC 98 01/15/2021   ALT 17 12/16/2017   AST 15 12/16/2017   NA 142 01/16/2021   K 4.8 01/16/2021   CL 107 01/16/2021   CREATININE 0.83 01/16/2021   BUN 15 01/16/2021   CO2 24 01/16/2021   TSH 0.91 01/15/2021   INR 1.20 03/07/2016   HGBA1C 5.1 01/15/2021    MR Shoulder Right W Contrast  Result Date: 11/05/2020 CLINICAL DATA:  Right shoulder weakness and pain for 3 months. No known injury. EXAM: MR ARTHROGRAM OF THE RIGHT SHOULDER TECHNIQUE: Multiplanar, multisequence MR imaging of the right shoulder was performed following the administration of intra-articular contrast. CONTRAST:  See Injection Documentation. COMPARISON:  Plain films right shoulder 08/25/2020. FINDINGS: Rotator cuff: Rotator cuff tendinopathy is most notable in the supraspinatus. An interstitial tear of the anterior and far lateral supraspinatus measures 0.6 cm from front to back. No retraction. Negative for full-thickness rotator cuff tear. Muscles: No atrophy or focal lesion. Biceps long head: Intact and normal in appearance.  Acromioclavicular Joint: Moderate osteoarthritis. The acromion is type 1. There is a small volume of fluid in the subacromial/subdeltoid bursa. There is no contrast in the bursa. Glenohumeral Joint: Appears normal. Labrum: Intact. Bones: No fracture or worrisome lesion. IMPRESSION: Rotator cuff tendinopathy is most notable in the supraspinatus where there is a 0.6 cm from front to back interstitial tear. Moderate acromioclavicular osteoarthritis. Small volume of subacromial/subdeltoid fluid compatible with bursitis. Electronically Signed   By: Inge Rise M.D.   On: 11/05/2020 11:51   DG FLUORO GUIDED NEEDLE PLC ASPIRATION/INJECTION LOC  Result Date: 11/04/2020 CLINICAL DATA:  55 year old female with chronic right-sided shoulder pain EXAM: SHOULDER INJECTION FOR MRI FLUOROSCOPY TIME:  0 minutes 10 seconds 0.2 mGy PROCEDURE:  After a thorough discussion of risks and benefits of the procedure, written and oral informed consent was obtained. The consent discussion included the risk of bleeding, infection and injury to nerves and adjacent blood vessels. Extra-articular injection was also a possible risk discussed. Verbal consent was obtained by Dr. Laurence Ferrari. Preliminary localization was performed over the right shoulder. The area was marked over superior medial anterior humeral head. After prep and drape in the usual sterile fashion, the skin and deeper subcutaneous tissues were anesthetized with 1% Lidocaine without Epinephrine. Under fluoroscopic guidance, a 22 gauge 3.5 inch spinal needle was advanced into the joint over the superior medial anterior humeral head using an anterior approach. Intra-articular injection of Lidocaine was performed which flowed freely and subsequently the joint was distended with 13 ml of a 1:200 dilution of Multihance contrast. The MR arthrogram solution was as follows: 15 ml of omnipaque 180 contrast agent, 0.1 mL Multihance, 5 ml of 1% Lidocaine. An end point was felt as well as  the patient experiencing pressure and the injection was discontinued, the needle removed, and a sterile dressing applied. The patient was taken to MRI for subsequent imaging. The patient tolerated the procedure well and there were no complications. IMPRESSION: Successful right shoulder fluoroscopically guided injection. Electronically Signed   By: Jacqulynn Cadet M.D.   On: 11/04/2020 16:08    Assessment & Plan:   Shelsie was seen today for annual exam.  Diagnoses and all orders for this visit:  Bariatric surgery status- I will monitor her for vitamin deficiencies. -     Iron; Future -     Vitamin B12; Future -     Vitamin B1; Future -     Folate; Future -     Ferritin; Future -     VITAMIN D 25 Hydroxy (Vit-D Deficiency, Fractures); Future -     VITAMIN D 25 Hydroxy (Vit-D Deficiency, Fractures) -     Ferritin -     Folate -     Vitamin B1 -     Vitamin B12 -     Iron -     Vitamin B1; Future -     PTH, intact and calcium; Future  Hypocalcemia- Her calcium level is normal now. -     VITAMIN D 25 Hydroxy (Vit-D Deficiency, Fractures); Future -     PTH, intact and calcium; Future -     PTH, intact and calcium -     VITAMIN D 25 Hydroxy (Vit-D Deficiency, Fractures) -     Basic metabolic panel; Future  Folate deficiency- Her folate level is normal now. -     CBC with Differential/Platelet; Future -     Vitamin B12; Future -     Folate; Future -     Folate -     Vitamin B12 -     CBC with Differential/Platelet  Other iron deficiency anemia- Her H&H are normal but her white cell count and platelet count continue to be mildly elevated.  I will continue to monitor this. -     CBC with Differential/Platelet; Future -     Iron; Future -     Ferritin; Future -     Ferritin -     Iron -     CBC with Differential/Platelet  Thiamine deficiency- I will monitor her thiamine level. -     CBC with Differential/Platelet; Future -     Vitamin B1; Future -     Vitamin B1 -  CBC  with Differential/Platelet -     Vitamin B1; Future  Routine general medical examination at a health care facility- Exam completed, labs reviewed, vaccines reviewed and updated, cancer screenings are being addressed, patient education material was given. -     Lipid panel; Future -     Lipid panel  Vitamin D deficiency- Her vitamin D level is slightly low.  I recommended that she restart the vitamin D supplement. -     VITAMIN D 25 Hydroxy (Vit-D Deficiency, Fractures); Future -     VITAMIN D 25 Hydroxy (Vit-D Deficiency, Fractures)  Benign paroxysmal positional vertigo, bilateral -     diazepam (VALIUM) 5 MG tablet; Take 1 tablet (5 mg total) by mouth every 12 (twelve) hours as needed for anxiety. -     meclizine (ANTIVERT) 25 MG tablet; Take 1 tablet (25 mg total) by mouth 2 (two) times daily as needed for dizziness.  Class 1 obesity due to excess calories with serious comorbidity and body mass index (BMI) of 30.0 to 30.9 in adult -     Liraglutide -Weight Management (SAXENDA) 18 MG/3ML SOPN; Inject 3 mg into the skin daily. -     Insulin Pen Needle 32G X 6 MM MISC; 1 Act by Does not apply route daily. -     TSH; Future -     Hemoglobin A1c; Future -     Hemoglobin A1c -     TSH  Menopausal hot flushes -     estradiol (CLIMARA - DOSED IN MG/24 HR) 0.05 mg/24hr patch; Place 1 patch (0.05 mg total) onto the skin once a week.   I have discontinued Mizuki Hoel. Bontrager's meloxicam. I have also changed her diazepam, meclizine, and Vitamin D (Ergocalciferol). Additionally, I am having her start on estradiol, Saxenda, and Insulin Pen Needle. Lastly, I am having her maintain her multivitamin, b complex vitamins, B-12, iron polysaccharides, montelukast, levocetirizine, and folic acid.  Meds ordered this encounter  Medications  . diazepam (VALIUM) 5 MG tablet    Sig: Take 1 tablet (5 mg total) by mouth every 12 (twelve) hours as needed for anxiety.    Dispense:  30 tablet    Refill:  1  .  meclizine (ANTIVERT) 25 MG tablet    Sig: Take 1 tablet (25 mg total) by mouth 2 (two) times daily as needed for dizziness.    Dispense:  65 tablet    Refill:  2  . estradiol (CLIMARA - DOSED IN MG/24 HR) 0.05 mg/24hr patch    Sig: Place 1 patch (0.05 mg total) onto the skin once a week.    Dispense:  12 patch    Refill:  0  . Liraglutide -Weight Management (SAXENDA) 18 MG/3ML SOPN    Sig: Inject 3 mg into the skin daily.    Dispense:  9 mL    Refill:  1  . Insulin Pen Needle 32G X 6 MM MISC    Sig: 1 Act by Does not apply route daily.    Dispense:  100 each    Refill:  1  . Vitamin D, Ergocalciferol, (DRISDOL) 1.25 MG (50000 UNIT) CAPS capsule    Sig: Take 1 capsule (50,000 Units total) by mouth every 7 (seven) days. Take 100,000 units 5/7 days, and 50,000 units 2/7 days.    Dispense:  12 capsule    Refill:  0     Follow-up: Return in about 3 months (around 04/17/2021).  Scarlette Calico, MD

## 2021-01-15 NOTE — Patient Instructions (Addendum)
Directions for Saxenda: 0.6 mg daily for 1 week, 1.2 mg daily for 1 week, 1.8 mg daily for 1 week, 2.4 mg daily for 1 week, 3.0 mg daily. Maximum daily dose is 3.0 mg.    Health Maintenance, Female Adopting a healthy lifestyle and getting preventive care are important in promoting health and wellness. Ask your health care provider about:  The right schedule for you to have regular tests and exams.  Things you can do on your own to prevent diseases and keep yourself healthy. What should I know about diet, weight, and exercise? Eat a healthy diet  Eat a diet that includes plenty of vegetables, fruits, low-fat dairy products, and lean protein.  Do not eat a lot of foods that are high in solid fats, added sugars, or sodium.   Maintain a healthy weight Body mass index (BMI) is used to identify weight problems. It estimates body fat based on height and weight. Your health care provider can help determine your BMI and help you achieve or maintain a healthy weight. Get regular exercise Get regular exercise. This is one of the most important things you can do for your health. Most adults should:  Exercise for at least 150 minutes each week. The exercise should increase your heart rate and make you sweat (moderate-intensity exercise).  Do strengthening exercises at least twice a week. This is in addition to the moderate-intensity exercise.  Spend less time sitting. Even light physical activity can be beneficial. Watch cholesterol and blood lipids Have your blood tested for lipids and cholesterol at 54 years of age, then have this test every 5 years. Have your cholesterol levels checked more often if:  Your lipid or cholesterol levels are high.  You are older than 54 years of age.  You are at high risk for heart disease. What should I know about cancer screening? Depending on your health history and family history, you may need to have cancer screening at various ages. This may include  screening for:  Breast cancer.  Cervical cancer.  Colorectal cancer.  Skin cancer.  Lung cancer. What should I know about heart disease, diabetes, and high blood pressure? Blood pressure and heart disease  High blood pressure causes heart disease and increases the risk of stroke. This is more likely to develop in people who have high blood pressure readings, are of African descent, or are overweight.  Have your blood pressure checked: ? Every 3-5 years if you are 64-40 years of age. ? Every year if you are 11 years old or older. Diabetes Have regular diabetes screenings. This checks your fasting blood sugar level. Have the screening done:  Once every three years after age 83 if you are at a normal weight and have a low risk for diabetes.  More often and at a younger age if you are overweight or have a high risk for diabetes. What should I know about preventing infection? Hepatitis B If you have a higher risk for hepatitis B, you should be screened for this virus. Talk with your health care provider to find out if you are at risk for hepatitis B infection. Hepatitis C Testing is recommended for:  Everyone born from [birth year] through 1964.  Anyone with known risk factors for hepatitis C. Sexually transmitted infections (STIs)  Get screened for STIs, including gonorrhea and chlamydia, if: ? You are sexually active and are younger than 53 years of age. ? You are older than 54 years of age and your health care  provider tells you that you are at risk for this type of infection. ? Your sexual activity has changed since you were last screened, and you are at increased risk for chlamydia or gonorrhea. Ask your health care provider if you are at risk.  Ask your health care provider about whether you are at high risk for HIV. Your health care provider may recommend a prescription medicine to help prevent HIV infection. If you choose to take medicine to prevent HIV, you should first get  tested for HIV. You should then be tested every 3 months for as long as you are taking the medicine. Pregnancy  If you are about to stop having your period (premenopausal) and you may become pregnant, seek counseling before you get pregnant.  Take 400 to 800 micrograms (mcg) of folic acid every day if you become pregnant.  Ask for birth control (contraception) if you want to prevent pregnancy. Osteoporosis and menopause Osteoporosis is a disease in which the bones lose minerals and strength with aging. This can result in bone fractures. If you are 58 years old or older, or if you are at risk for osteoporosis and fractures, ask your health care provider if you should:  Be screened for bone loss.  Take a calcium or vitamin D supplement to lower your risk of fractures.  Be given hormone replacement therapy (HRT) to treat symptoms of menopause. Follow these instructions at home: Lifestyle  Do not use any products that contain nicotine or tobacco, such as cigarettes, e-cigarettes, and chewing tobacco. If you need help quitting, ask your health care provider.  Do not use street drugs.  Do not share needles.  Ask your health care provider for help if you need support or information about quitting drugs. Alcohol use  Do not drink alcohol if: ? Your health care provider tells you not to drink. ? You are pregnant, may be pregnant, or are planning to become pregnant.  If you drink alcohol: ? Limit how much you use to 0-1 drink a day. ? Limit intake if you are breastfeeding.  Be aware of how much alcohol is in your drink. In the [place] one drink equals one 12 oz bottle of beer (355 mL), one 5 oz glass of wine (148 mL), or one 1 oz glass of hard liquor (44 mL). General instructions  Schedule regular health, dental, and eye exams.  Stay current with your vaccines.  Tell your health care provider if: ? You often feel depressed. ? You have ever been abused or do not feel safe at  home. Summary  Adopting a healthy lifestyle and getting preventive care are important in promoting health and wellness.  Follow your health care provider's instructions about healthy diet, exercising, and getting tested or screened for diseases.  Follow your health care provider's instructions on monitoring your cholesterol and blood pressure. This information is not intended to replace advice given to you by your health care provider. Make sure you discuss any questions you have with your health care provider. Document Revised: 08/16/2018 Document Reviewed: 08/16/2018 Elsevier Patient Education  2021 [employer].

## 2021-01-16 ENCOUNTER — Other Ambulatory Visit (INDEPENDENT_AMBULATORY_CARE_PROVIDER_SITE_OTHER): Payer: BC Managed Care – PPO

## 2021-01-16 LAB — BASIC METABOLIC PANEL
BUN: 15 mg/dL (ref 6–23)
CO2: 24 mEq/L (ref 19–32)
Calcium: 8.5 mg/dL (ref 8.4–10.5)
Chloride: 107 mEq/L (ref 96–112)
Creatinine, Ser: 0.83 mg/dL (ref 0.40–1.20)
GFR: 80.21 mL/min (ref >60.00)
Glucose, Bld: 90 mg/dL (ref 70–99)
Potassium: 4.8 mEq/L (ref 3.5–5.1)
Sodium: 142 mEq/L (ref 135–145)

## 2021-01-16 LAB — FERRITIN: Ferritin: 58.1 ng/mL (ref 10.0–291.0)

## 2021-01-16 LAB — VITAMIN D 25 HYDROXY (VIT D DEFICIENCY, FRACTURES): VITD: 27.47 ng/mL — ABNORMAL LOW (ref 30.00–100.00)

## 2021-01-16 LAB — VITAMIN B12: Vitamin B-12: 237 pg/mL (ref 211–911)

## 2021-01-16 LAB — TSH: TSH: 0.91 u[IU]/mL (ref 0.35–4.50)

## 2021-01-16 LAB — FOLATE: Folate: 6.1 ng/mL (ref >5.9)

## 2021-01-16 MED ORDER — VITAMIN D (ERGOCALCIFEROL) 1.25 MG (50000 UNIT) PO CAPS
50000.0000 [IU] | ORAL_CAPSULE | ORAL | 0 refills | Status: DC
Start: 2021-01-16 — End: 2021-04-09

## 2021-01-20 DIAGNOSIS — Z1231 Encounter for screening mammogram for malignant neoplasm of breast: Secondary | ICD-10-CM | POA: Diagnosis not present

## 2021-01-20 DIAGNOSIS — Z6831 Body mass index (BMI) 31.0-31.9, adult: Secondary | ICD-10-CM | POA: Diagnosis not present

## 2021-01-20 DIAGNOSIS — Z01419 Encounter for gynecological examination (general) (routine) without abnormal findings: Secondary | ICD-10-CM | POA: Diagnosis not present

## 2021-01-21 ENCOUNTER — Telehealth: Payer: Self-pay

## 2021-01-21 LAB — PTH, INTACT AND CALCIUM
Calcium: 8.8 mg/dL (ref 8.6–10.4)
PTH: 85 pg/mL — ABNORMAL HIGH (ref 16–77)

## 2021-01-21 LAB — VITAMIN B1: Vitamin B1 (Thiamine): 12 nmol/L (ref 8–30)

## 2021-01-21 NOTE — Telephone Encounter (Signed)
Key: [id number]

## 2021-01-22 NOTE — Telephone Encounter (Signed)
PA was denied

## 2021-01-26 ENCOUNTER — Encounter: Payer: Self-pay | Admitting: Internal Medicine

## 2021-01-27 ENCOUNTER — Other Ambulatory Visit: Payer: Self-pay | Admitting: Internal Medicine

## 2021-01-27 DIAGNOSIS — Z6830 Body mass index (BMI) 30.0-30.9, adult: Secondary | ICD-10-CM

## 2021-01-27 DIAGNOSIS — E6609 Other obesity due to excess calories: Secondary | ICD-10-CM

## 2021-02-04 ENCOUNTER — Other Ambulatory Visit: Payer: Self-pay | Admitting: Internal Medicine

## 2021-02-04 DIAGNOSIS — E6609 Other obesity due to excess calories: Secondary | ICD-10-CM

## 2021-02-04 DIAGNOSIS — Z6830 Body mass index (BMI) 30.0-30.9, adult: Secondary | ICD-10-CM

## 2021-02-04 MED ORDER — PHENTERMINE HCL 37.5 MG PO TABS: 37.5000 mg | ORAL_TABLET | Freq: Every day | ORAL | 0 refills | Status: DC

## 2021-02-05 ENCOUNTER — Other Ambulatory Visit: Payer: Self-pay

## 2021-02-05 ENCOUNTER — Other Ambulatory Visit: Payer: Self-pay | Admitting: Internal Medicine

## 2021-02-05 ENCOUNTER — Encounter: Payer: Self-pay | Admitting: Internal Medicine

## 2021-02-05 ENCOUNTER — Ambulatory Visit (INDEPENDENT_AMBULATORY_CARE_PROVIDER_SITE_OTHER)
Admission: RE | Admit: 2021-02-05 | Discharge: 2021-02-05 | Disposition: A | Discharge status: Home / Self Care | Payer: BC Managed Care – PPO | Source: Ambulatory Visit | Attending: Internal Medicine | Admitting: Internal Medicine

## 2021-02-05 DIAGNOSIS — T17208A Unspecified foreign body in pharynx causing other injury, initial encounter: Secondary | ICD-10-CM

## 2021-02-05 DIAGNOSIS — R070 Pain in throat: Secondary | ICD-10-CM | POA: Diagnosis not present

## 2021-02-20 DIAGNOSIS — Z13820 Encounter for screening for osteoporosis: Secondary | ICD-10-CM | POA: Diagnosis not present

## 2021-02-27 ENCOUNTER — Ambulatory Visit: Payer: BC Managed Care – PPO | Admitting: Family Medicine

## 2021-04-02 ENCOUNTER — Ambulatory Visit: Payer: BC Managed Care – PPO | Admitting: Family Medicine

## 2021-04-08 ENCOUNTER — Other Ambulatory Visit: Payer: Self-pay | Admitting: Internal Medicine

## 2021-04-08 DIAGNOSIS — N951 Menopausal and female climacteric states: Secondary | ICD-10-CM

## 2021-04-08 DIAGNOSIS — E559 Vitamin D deficiency, unspecified: Secondary | ICD-10-CM

## 2021-05-07 ENCOUNTER — Other Ambulatory Visit: Payer: Self-pay | Admitting: Internal Medicine

## 2021-05-07 DIAGNOSIS — E6609 Other obesity due to excess calories: Secondary | ICD-10-CM

## 2021-07-01 ENCOUNTER — Other Ambulatory Visit: Payer: Self-pay | Admitting: Internal Medicine

## 2021-07-01 DIAGNOSIS — N951 Menopausal and female climacteric states: Secondary | ICD-10-CM

## 2021-07-07 ENCOUNTER — Other Ambulatory Visit: Payer: Self-pay | Admitting: Internal Medicine

## 2021-07-07 DIAGNOSIS — E559 Vitamin D deficiency, unspecified: Secondary | ICD-10-CM

## 2021-07-16 ENCOUNTER — Other Ambulatory Visit: Payer: Self-pay | Admitting: Internal Medicine

## 2021-07-16 DIAGNOSIS — H8113 Benign paroxysmal vertigo, bilateral: Secondary | ICD-10-CM

## 2021-08-31 ENCOUNTER — Other Ambulatory Visit: Payer: Self-pay | Admitting: Internal Medicine

## 2021-08-31 DIAGNOSIS — E6609 Other obesity due to excess calories: Secondary | ICD-10-CM

## 2021-09-25 ENCOUNTER — Other Ambulatory Visit: Payer: Self-pay | Admitting: Internal Medicine

## 2021-09-25 DIAGNOSIS — N951 Menopausal and female climacteric states: Secondary | ICD-10-CM

## 2021-09-29 ENCOUNTER — Other Ambulatory Visit: Payer: Self-pay | Admitting: Internal Medicine

## 2021-09-29 DIAGNOSIS — E559 Vitamin D deficiency, unspecified: Secondary | ICD-10-CM

## 2021-11-19 ENCOUNTER — Other Ambulatory Visit: Payer: Self-pay | Admitting: Internal Medicine

## 2022-01-12 ENCOUNTER — Encounter (INDEPENDENT_AMBULATORY_CARE_PROVIDER_SITE_OTHER): Payer: BC Managed Care – PPO | Admitting: Internal Medicine

## 2022-01-12 ENCOUNTER — Other Ambulatory Visit: Payer: Self-pay | Admitting: Internal Medicine

## 2022-01-12 DIAGNOSIS — L237 Allergic contact dermatitis due to plants, except food: Secondary | ICD-10-CM

## 2022-01-12 DIAGNOSIS — L247 Irritant contact dermatitis due to plants, except food: Secondary | ICD-10-CM | POA: Diagnosis not present

## 2022-01-12 MED ORDER — METHYLPREDNISOLONE 4 MG PO TBPK: ORAL_TABLET | ORAL | 0 refills | Status: AC

## 2022-01-12 MED ORDER — CLOBETASOL PROPIONATE 0.05 % EX OINT: 1.0000 "application " | TOPICAL_OINTMENT | Freq: Two times a day (BID) | CUTANEOUS | 0 refills | Status: AC

## 2022-02-08 ENCOUNTER — Other Ambulatory Visit: Payer: Self-pay | Admitting: Internal Medicine

## 2022-02-08 DIAGNOSIS — N951 Menopausal and female climacteric states: Secondary | ICD-10-CM

## 2022-02-22 ENCOUNTER — Encounter: Payer: Self-pay | Admitting: Internal Medicine

## 2022-02-23 ENCOUNTER — Other Ambulatory Visit: Payer: Self-pay | Admitting: Internal Medicine

## 2022-02-23 MED ORDER — SCOPOLAMINE 1 MG/3DAYS TD PT72: 1.0000 | MEDICATED_PATCH | TRANSDERMAL | 0 refills | Status: DC

## 2022-03-10 DIAGNOSIS — C44329 Squamous cell carcinoma of skin of other parts of face: Secondary | ICD-10-CM | POA: Diagnosis not present

## 2022-03-17 DIAGNOSIS — Z6831 Body mass index (BMI) 31.0-31.9, adult: Secondary | ICD-10-CM | POA: Diagnosis not present

## 2022-03-17 DIAGNOSIS — Z1231 Encounter for screening mammogram for malignant neoplasm of breast: Secondary | ICD-10-CM | POA: Diagnosis not present

## 2022-03-17 DIAGNOSIS — Z01419 Encounter for gynecological examination (general) (routine) without abnormal findings: Secondary | ICD-10-CM | POA: Diagnosis not present

## 2022-03-17 LAB — HM MAMMOGRAPHY

## 2022-03-24 ENCOUNTER — Ambulatory Visit: Payer: BC Managed Care – PPO | Admitting: Dermatology

## 2022-04-05 ENCOUNTER — Other Ambulatory Visit: Payer: Self-pay | Admitting: Obstetrics and Gynecology

## 2022-04-05 DIAGNOSIS — Z8249 Family history of ischemic heart disease and other diseases of the circulatory system: Secondary | ICD-10-CM

## 2022-04-07 DIAGNOSIS — C44329 Squamous cell carcinoma of skin of other parts of face: Secondary | ICD-10-CM | POA: Diagnosis not present

## 2022-04-16 ENCOUNTER — Encounter: Payer: Self-pay | Admitting: Hematology

## 2022-04-23 ENCOUNTER — Inpatient Hospital Stay: Admission: RE | Admit: 2022-04-23 | Payer: BC Managed Care – PPO | Source: Ambulatory Visit

## 2022-06-14 ENCOUNTER — Encounter: Payer: Self-pay | Admitting: Internal Medicine

## 2022-06-14 ENCOUNTER — Ambulatory Visit (INDEPENDENT_AMBULATORY_CARE_PROVIDER_SITE_OTHER): Payer: 59 | Admitting: Internal Medicine

## 2022-06-14 ENCOUNTER — Encounter: Payer: Self-pay | Admitting: Hematology

## 2022-06-14 VITALS — BP 112/80 | HR 74 | Temp 97.9°F | Ht 71.0 in | Wt 224.0 lb

## 2022-06-14 DIAGNOSIS — E213 Hyperparathyroidism, unspecified: Secondary | ICD-10-CM | POA: Diagnosis not present

## 2022-06-14 DIAGNOSIS — E538 Deficiency of other specified B group vitamins: Secondary | ICD-10-CM | POA: Diagnosis not present

## 2022-06-14 DIAGNOSIS — Z0000 Encounter for general adult medical examination without abnormal findings: Secondary | ICD-10-CM

## 2022-06-14 DIAGNOSIS — Z6830 Body mass index (BMI) 30.0-30.9, adult: Secondary | ICD-10-CM

## 2022-06-14 DIAGNOSIS — E66811 Obesity, class 1: Secondary | ICD-10-CM

## 2022-06-14 DIAGNOSIS — E519 Thiamine deficiency, unspecified: Secondary | ICD-10-CM

## 2022-06-14 DIAGNOSIS — E559 Vitamin D deficiency, unspecified: Secondary | ICD-10-CM

## 2022-06-14 DIAGNOSIS — D508 Other iron deficiency anemias: Secondary | ICD-10-CM

## 2022-06-14 DIAGNOSIS — E6609 Other obesity due to excess calories: Secondary | ICD-10-CM | POA: Diagnosis not present

## 2022-06-14 DIAGNOSIS — Z23 Encounter for immunization: Secondary | ICD-10-CM

## 2022-06-14 DIAGNOSIS — E60 Dietary zinc deficiency: Secondary | ICD-10-CM

## 2022-06-14 DIAGNOSIS — Z9884 Bariatric surgery status: Secondary | ICD-10-CM | POA: Diagnosis not present

## 2022-06-14 DIAGNOSIS — D510 Vitamin B12 deficiency anemia due to intrinsic factor deficiency: Secondary | ICD-10-CM | POA: Insufficient documentation

## 2022-06-14 LAB — CBC WITH DIFFERENTIAL/PLATELET
Basophils Absolute: 0.1 10*3/uL (ref 0.0–0.1)
Basophils Relative: 1.7 % (ref 0.0–3.0)
Eosinophils Absolute: 0.3 10*3/uL (ref 0.0–0.7)
Eosinophils Relative: 3.1 % (ref 0.0–5.0)
HCT: 39.5 % (ref 36.0–46.0)
Hemoglobin: 12.9 g/dL (ref 12.0–15.0)
Lymphocytes Relative: 38.7 % (ref 12.0–46.0)
Lymphs Abs: 3.3 10*3/uL (ref 0.7–4.0)
MCHC: 32.8 g/dL (ref 30.0–36.0)
MCV: 90.1 fl (ref 78.0–100.0)
Monocytes Absolute: 1 10*3/uL (ref 0.1–1.0)
Monocytes Relative: 11.9 % (ref 3.0–12.0)
Neutro Abs: 3.8 10*3/uL (ref 1.4–7.7)
Neutrophils Relative %: 44.6 % (ref 43.0–77.0)
Platelets: 402 10*3/uL — ABNORMAL HIGH (ref 150.0–400.0)
RBC: 4.38 Mil/uL (ref 3.87–5.11)
RDW: 14 % (ref 11.5–15.5)
WBC: 8.6 10*3/uL (ref 4.0–10.5)

## 2022-06-14 LAB — BASIC METABOLIC PANEL
BUN: 11 mg/dL (ref 6–23)
CO2: 27 mEq/L (ref 19–32)
Calcium: 8.4 mg/dL (ref 8.4–10.5)
Chloride: 108 mEq/L (ref 96–112)
Creatinine, Ser: 0.74 mg/dL (ref 0.40–1.20)
GFR: 91.15 mL/min (ref >60.00)
Glucose, Bld: 87 mg/dL (ref 70–99)
Potassium: 3.8 mEq/L (ref 3.5–5.1)
Sodium: 142 mEq/L (ref 135–145)

## 2022-06-14 LAB — LIPID PANEL
Cholesterol: 165 mg/dL (ref 0–200)
HDL: 49.3 mg/dL (ref >39.00)
LDL Cholesterol: 105 mg/dL — ABNORMAL HIGH (ref 0–99)
NonHDL: 115.76
Total CHOL/HDL Ratio: 3
Triglycerides: 56 mg/dL (ref 0.0–149.0)
VLDL: 11.2 mg/dL (ref 0.0–40.0)

## 2022-06-14 LAB — VITAMIN D 25 HYDROXY (VIT D DEFICIENCY, FRACTURES): VITD: 26.28 ng/mL — ABNORMAL LOW (ref 30.00–100.00)

## 2022-06-14 LAB — IBC + FERRITIN
Ferritin: 23.4 ng/mL (ref 10.0–291.0)
Iron: 65 ug/dL (ref 42–145)
Saturation Ratios: 17.4 % — ABNORMAL LOW (ref 20.0–50.0)
TIBC: 373.8 ug/dL (ref 250.0–450.0)
Transferrin: 267 mg/dL (ref 212.0–360.0)

## 2022-06-14 LAB — FOLATE: Folate: 8.2 ng/mL (ref >5.9)

## 2022-06-14 LAB — VITAMIN B12: Vitamin B-12: 205 pg/mL — ABNORMAL LOW (ref 211–911)

## 2022-06-14 NOTE — Progress Notes (Unsigned)
Subjective:  Patient ID: Sydney [name], female    DOB: 02/25/1966  Age: 56 y.o. MRN: [id number]  CC: Annual Exam   HPI Sydney [name] presents for a CPX and f/up -  She walks 2-3 miles a day and has good endurance - she denies CP, DOE, diaphoresis, edema. She complains of weight gain.  Outpatient Medications Prior to Visit  Medication Sig Dispense Refill   diazepam (VALIUM) 5 MG tablet TAKE 1 TABLET(5 MG) BY MOUTH EVERY 12 HOURS AS NEEDED FOR ANXIETY 30 tablet 0   folic acid (FOLVITE) 1 MG tablet TAKE 1 TABLET(1 MG) BY MOUTH DAILY 90 tablet 1   Insulin Pen Needle 32G X 6 MM MISC 1 Act by Does not apply route daily. 100 each 1   levocetirizine (XYZAL) 5 MG tablet Take 2 tablets (10 mg total) by mouth every evening. 180 tablet 0   meclizine (ANTIVERT) 25 MG tablet Take 1 tablet (25 mg total) by mouth 2 (two) times daily as needed for dizziness. 65 tablet 2   montelukast (SINGULAIR) 10 MG tablet Take 1 tablet (10 mg total) by mouth at bedtime. 90 tablet 0   Multiple Vitamin (MULTIVITAMIN) tablet Take 1 tablet by mouth 2 (two) times daily.     b complex vitamins capsule Take 1 capsule by mouth daily. (Patient not taking: Reported on 06/14/2022)     Cyanocobalamin (B-12) 1000 MCG SUBL Place 2,000 mcg under the tongue daily. (Patient not taking: Reported on 06/14/2022) 30 each 3   iron polysaccharides (NIFEREX) 150 MG capsule Take 1 capsule (150 mg total) by mouth daily. (Patient not taking: Reported on 06/14/2022)     Vitamin D, Ergocalciferol, (DRISDOL) 1.25 MG (50000 UNIT) CAPS capsule TAKE 1 CAPSULE BY MOUTH EVERY 7 DAYS AS DIRECTED (Patient not taking: Reported on 06/14/2022) 12 capsule 0   estradiol (CLIMARA - DOSED IN MG/24 HR) 0.05 mg/24hr patch APPLY 1 PATCH(0.05 MG) TOPICALLY TO THE SKIN 1 TIME A WEEK (Patient not taking: Reported on 06/14/2022) 12 patch 0   phentermine (ADIPEX-P) 37.5 MG tablet TAKE 1 TABLET(37.5 MG) BY MOUTH DAILY BEFORE AND BREAKFAST (Patient not taking: Reported on  06/14/2022) 90 tablet 0   scopolamine (TRANSDERM-SCOP, 1.5 MG,) 1 MG/3DAYS Place 1 patch (1.5 mg total) onto the skin every 3 (three) days. (Patient not taking: Reported on 06/14/2022) 10 patch 0   No facility-administered medications prior to visit.    ROS Review of Systems  Constitutional:  Positive for unexpected weight change (wt gain). Negative for chills, diaphoresis and fatigue.  HENT: Negative.    Eyes: Negative.   Respiratory:  Negative for cough, chest tightness, shortness of breath and wheezing.   Cardiovascular:  Negative for chest pain, palpitations and leg swelling.  Gastrointestinal:  Negative for abdominal pain, constipation, diarrhea, nausea and vomiting.  Endocrine: Negative.   Genitourinary: Negative.  Negative for difficulty urinating.  Musculoskeletal: Negative.   Skin: Negative.   Neurological:  Negative for dizziness, weakness and light-headedness.  Hematological:  Negative for adenopathy. Does not bruise/bleed easily.  Psychiatric/Behavioral: Negative.      Objective:  BP 112/80   Pulse 74   Temp 97.9 F (36.6 C) (Oral)   Ht '5\' 11"'$  (1.803 m)   Wt 224 lb (101.6 kg)   SpO2 98%   BMI 31.24 kg/m   BP Readings from Last 3 Encounters:  06/14/22 112/80  01/15/21 112/72  12/26/20 106/80    Wt Readings from Last 3 Encounters:  06/14/22 224 lb (101.6 kg)  01/15/21 210 lb (95.3 kg)  12/26/20 214 lb (97.1 kg)    Physical Exam Vitals reviewed.  HENT:     Nose: Nose normal.     Mouth/Throat:     Mouth: Mucous membranes are moist.  Eyes:     General: No scleral icterus.    Conjunctiva/sclera: Conjunctivae normal.  Cardiovascular:     Rate and Rhythm: Normal rate and regular rhythm.     Heart sounds: No murmur heard. Pulmonary:     Effort: Pulmonary effort is normal.     Breath sounds: No stridor. No wheezing, rhonchi or rales.  Abdominal:     General: Abdomen is flat.     Palpations: There is no mass.     Tenderness: There is no abdominal  tenderness. There is no guarding.     Hernia: No hernia is present.  Musculoskeletal:        General: Normal range of motion.     Cervical back: Neck supple.     Right lower leg: No edema.     Left lower leg: No edema.  Lymphadenopathy:     Cervical: No cervical adenopathy.  Skin:    General: Skin is warm and dry.  Neurological:     General: No focal deficit present.     Mental Status: She is alert.  Psychiatric:        Mood and Affect: Mood normal.        Behavior: Behavior normal.     Lab Results  Component Value Date   WBC 8.6 06/14/2022   HGB 12.9 06/14/2022   HCT 39.5 06/14/2022   PLT 402.0 (H) 06/14/2022   GLUCOSE 87 06/14/2022   CHOL 165 06/14/2022   TRIG 56.0 06/14/2022   HDL 49.30 06/14/2022   LDLCALC 105 (H) 06/14/2022   ALT 17 12/16/2017   AST 15 12/16/2017   NA 142 06/14/2022   K 3.8 06/14/2022   CL 108 06/14/2022   CREATININE 0.74 06/14/2022   BUN 11 06/14/2022   CO2 27 06/14/2022   TSH 0.91 01/15/2021   INR 1.20 03/07/2016   HGBA1C 5.1 01/15/2021    DG Neck Soft Tissue  Result Date: 02/05/2021 CLINICAL DATA:  Question fishbone. Patient 8 fish last night and feels throat discomfort. EXAM: NECK SOFT TISSUES - 1+ VIEW COMPARISON:  None FINDINGS: Cervical spinal alignment grossly normal with signs of mild degenerative change. Prevertebral soft tissues are normal. Epiglottis with normal appearance. Airways patent. No radiopaque foreign bodies on radiograph over the prevertebral soft tissues. IMPRESSION: No prevertebral soft tissue swelling or sign of radiopaque foreign body. Electronically Signed   By: Zetta Bills M.D.   On: 02/05/2021 17:07    Assessment & Plan:   Sydney [name] was seen today for annual exam.  Diagnoses and all orders for this visit:  Hyperparathyroidism ([place]) -     VITAMIN D 25 Hydroxy (Vit-D Deficiency, Fractures); Future -     Basic metabolic panel; Future -     Basic metabolic panel -     VITAMIN D 25 Hydroxy (Vit-D Deficiency,  Fractures)  Other iron deficiency anemia -     IBC + Ferritin; Future -     CBC with Differential/Platelet; Future -     CBC with Differential/Platelet -     IBC + Ferritin  Folate deficiency -     Vitamin B12; Future -     CBC with Differential/Platelet; Future -     CBC with Differential/Platelet -     Vitamin  B12  Bariatric surgery status -     IBC + Ferritin; Future -     Vitamin B12; Future -     Vitamin B1; Future -     VITAMIN D 25 Hydroxy (Vit-D Deficiency, Fractures); Future -     Zinc; Future -     Folate; Future -     Folate -     Zinc -     VITAMIN D 25 Hydroxy (Vit-D Deficiency, Fractures) -     Vitamin B1 -     Vitamin B12 -     IBC + Ferritin  Vitamin D deficiency -     VITAMIN D 25 Hydroxy (Vit-D Deficiency, Fractures); Future -     VITAMIN D 25 Hydroxy (Vit-D Deficiency, Fractures) -     Cholecalciferol 50 MCG (2000 UT) TABS; Take 1 tablet (2,000 Units total) by mouth daily.  Hypocalcemia -     Basic metabolic panel; Future -     Basic metabolic panel  Thiamine deficiency -     Vitamin B1; Future -     CBC with Differential/Platelet; Future -     CBC with Differential/Platelet -     Vitamin B1  Routine general medical examination at a health care facility -     Lipid panel; Future -     HIV Antibody (routine testing w rflx); Future -     HIV Antibody (routine testing w rflx) -     Lipid panel  Need for vaccination -     Flu Vaccine QUAD 6+ mos PF IM (Fluarix Quad PF) -     Zoster Recombinant (Shingrix )  Vitamin B12 deficiency anemia due to intrinsic factor deficiency  Class 1 obesity due to excess calories with serious comorbidity and body mass index (BMI) of 30.0 to 30.9 in adult -     phentermine (ADIPEX-P) 37.5 MG tablet; Take 1 tablet (37.5 mg total) by mouth daily before breakfast.  Chronic zinc deficiency -     zinc gluconate 50 MG tablet; Take 1 tablet (50 mg total) by mouth daily.   I have discontinued Vanassa Penniman. Packett's  estradiol and scopolamine. I have also changed her phentermine. Additionally, I am having her start on zinc gluconate and Cholecalciferol. Lastly, I am having her maintain her multivitamin, b complex vitamins, B-12, iron polysaccharides, montelukast, levocetirizine, folic acid, meclizine, Insulin Pen Needle, Vitamin D (Ergocalciferol), and diazepam.  Meds ordered this encounter  Medications   phentermine (ADIPEX-P) 37.5 MG tablet    Sig: Take 1 tablet (37.5 mg total) by mouth daily before breakfast.    Dispense:  90 tablet    Refill:  0   zinc gluconate 50 MG tablet    Sig: Take 1 tablet (50 mg total) by mouth daily.    Dispense:  90 tablet    Refill:  1   Cholecalciferol 50 MCG (2000 UT) TABS    Sig: Take 1 tablet (2,000 Units total) by mouth daily.    Dispense:  90 tablet    Refill:  1     Follow-up: Return in about 6 months (around 12/14/2022).  Scarlette Calico, MD

## 2022-06-14 NOTE — Patient Instructions (Signed)

## 2022-06-15 LAB — HIV ANTIBODY (ROUTINE TESTING W REFLEX): HIV 1&2 Ab, 4th Generation: NONREACTIVE

## 2022-06-15 MED ORDER — PHENTERMINE HCL 37.5 MG PO TABS: 37.5000 mg | ORAL_TABLET | Freq: Every day | ORAL | 0 refills | Status: DC

## 2022-06-18 DIAGNOSIS — E60 Dietary zinc deficiency: Secondary | ICD-10-CM | POA: Insufficient documentation

## 2022-06-18 LAB — ZINC: Zinc: 52 ug/dL — ABNORMAL LOW (ref 60–130)

## 2022-06-18 LAB — VITAMIN B1: Vitamin B1 (Thiamine): 15 nmol/L (ref 8–30)

## 2022-06-18 MED ORDER — ZINC GLUCONATE 50 MG PO TABS: 50.0000 mg | ORAL_TABLET | Freq: Every day | ORAL | 1 refills | Status: DC

## 2022-06-19 MED ORDER — CHOLECALCIFEROL 50 MCG (2000 UT) PO TABS: 1.0000 | ORAL_TABLET | Freq: Every day | ORAL | 1 refills | Status: DC

## 2022-07-02 ENCOUNTER — Other Ambulatory Visit: Payer: BC Managed Care – PPO

## 2022-07-16 ENCOUNTER — Encounter: Payer: Self-pay | Admitting: Hematology

## 2022-07-16 ENCOUNTER — Ambulatory Visit
Admission: RE | Admit: 2022-07-16 | Discharge: 2022-07-16 | Disposition: A | Discharge status: Home / Self Care | Payer: No Typology Code available for payment source | Source: Ambulatory Visit | Attending: Obstetrics and Gynecology | Admitting: Obstetrics and Gynecology

## 2022-07-16 DIAGNOSIS — Z8249 Family history of ischemic heart disease and other diseases of the circulatory system: Secondary | ICD-10-CM

## 2022-08-16 NOTE — Progress Notes (Unsigned)
Synopsis: Referred for subsolid RLL nodule by Arvella Nigh, MD  Subjective:   PATIENT ID: Sydney [name] GENDER: female DOB: 01-01-1966, MRN: [id number]  No chief complaint on file.  56yF with history of ovarian cyst referred for subsolid RLL 44m nodule on coronary CT   Otherwise pertinent review of systems is negative.  Past Medical History:  Diagnosis Date   Abdominal pain, RLQ    Right ovarian cyst      Family History  Problem Relation Age of Onset   Heart failure Mother    Heart failure Father    Hypertension Father    Diabetes Father    Cancer Other      Past Surgical History:  Procedure Laterality Date   CHOLECYSTECTOMY  1990   CYSTOSCOPY  02/17/2012   Procedure: CYSTOSCOPY;  Surgeon: JDarlyn Chamber MD;  Location: [facility]  Service: Gynecology;;   DIAGNOSTIC LAPAROSCOPY  1998   ENDOMETRIOSIS   GANGLION CYST REMOVED  X3   LAST ONE 1989   LEFT WRIST   LIPOMA REMOVED  2008   RIGHT PALM   MINI GASTRIC BYPASS  2009   NASAL SINUS SURGERY  X3    LAST ONE 1996   ORIF RIGHT ELBOW FX  2004   PUBOVAGINAL SLING  2001   SHawi  VAGINAL HYSTERECTOMY  1999    Social History   Socioeconomic History   Marital status: Married    Spouse name: Not on file   Number of children: Not on file   Years of education: Not on file   Highest education level: Not on file  Occupational History   Not on file  Tobacco Use   Smoking status: Never   Smokeless tobacco: Never  Vaping Use   Vaping Use: Never used  Substance and Sexual Activity   Alcohol use: Yes    Comment: OCCASIONAL   Drug use: No   Sexual activity: Yes    Partners: Male  Other Topics Concern   Not on file  Social History Narrative   Regular exercise: yes, walk 20 min daily   Caffeine use: 1 cup of coffee daily   Social Determinants of Health   Financial Resource Strain: Not on file  Food Insecurity: Not on file  Transportation Needs: Not  on file  Physical Activity: Not on file  Stress: Not on file  Social Connections: Not on file  Intimate Partner Violence: Not on file     Allergies  Allergen Reactions   Codeine Hives     Outpatient Medications Prior to Visit  Medication Sig Dispense Refill   b complex vitamins capsule Take 1 capsule by mouth daily. (Patient not taking: Reported on 06/14/2022)     Cholecalciferol 50 MCG (2000 UT) TABS Take 1 tablet (2,000 Units total) by mouth daily. 90 tablet 1   Cyanocobalamin (B-12) 1000 MCG SUBL Place 2,000 mcg under the tongue daily. (Patient not taking: Reported on 06/14/2022) 30 each 3   diazepam (VALIUM) 5 MG tablet TAKE 1 TABLET(5 MG) BY MOUTH EVERY 12 HOURS AS NEEDED FOR ANXIETY 30 tablet 0   folic acid (FOLVITE) 1 MG tablet TAKE 1 TABLET(1 MG) BY MOUTH DAILY 90 tablet 1   Insulin Pen Needle 32G X 6 MM MISC 1 Act by Does not apply route daily. 100 each 1   iron polysaccharides (NIFEREX) 150 MG capsule Take 1 capsule (150 mg total) by mouth daily. (Patient  not taking: Reported on 06/14/2022)     levocetirizine (XYZAL) 5 MG tablet Take 2 tablets (10 mg total) by mouth every evening. 180 tablet 0   meclizine (ANTIVERT) 25 MG tablet Take 1 tablet (25 mg total) by mouth 2 (two) times daily as needed for dizziness. 65 tablet 2   montelukast (SINGULAIR) 10 MG tablet Take 1 tablet (10 mg total) by mouth at bedtime. 90 tablet 0   Multiple Vitamin (MULTIVITAMIN) tablet Take 1 tablet by mouth 2 (two) times daily.     phentermine (ADIPEX-P) 37.5 MG tablet Take 1 tablet (37.5 mg total) by mouth daily before breakfast. 90 tablet 0   Vitamin D, Ergocalciferol, (DRISDOL) 1.25 MG (50000 UNIT) CAPS capsule TAKE 1 CAPSULE BY MOUTH EVERY 7 DAYS AS DIRECTED (Patient not taking: Reported on 06/14/2022) 12 capsule 0   zinc gluconate 50 MG tablet Take 1 tablet (50 mg total) by mouth daily. 90 tablet 1   No facility-administered medications prior to visit.       Objective:   Physical  Exam:  General appearance: 56 y.o., female, NAD, conversant  Eyes: anicteric sclerae; PERRL, tracking appropriately HENT: NCAT; MMM Neck: Trachea midline; no lymphadenopathy, no JVD Lungs: CTAB, no crackles, no wheeze, with normal respiratory effort CV: RRR, no murmur  Abdomen: Soft, non-tender; non-distended, BS present  Extremities: No peripheral edema, warm Skin: Normal turgor and texture; no rash Psych: Appropriate affect Neuro: Alert and oriented to person and place, no focal deficit     There were no vitals filed for this visit.   on *** LPM *** RA BMI Readings from Last 3 Encounters:  06/14/22 31.24 kg/m  01/15/21 29.29 kg/m  12/26/20 29.85 kg/m   Wt Readings from Last 3 Encounters:  06/14/22 224 lb (101.6 kg)  01/15/21 210 lb (95.3 kg)  12/26/20 214 lb (97.1 kg)     CBC    Component Value Date/Time   WBC 8.6 06/14/2022 1510   RBC 4.38 06/14/2022 1510   HGB 12.9 06/14/2022 1510   HGB 13.2 10/28/2017 0952   HGB 13.7 04/29/2017 0944   HCT 39.5 06/14/2022 1510   HCT 42.9 04/29/2017 0944   PLT 402.0 (H) 06/14/2022 1510   PLT 382 10/28/2017 0952   PLT 432 (H) 04/29/2017 0944   MCV 90.1 06/14/2022 1510   MCV 95.3 04/29/2017 0944   MCH 30.6 10/28/2017 0952   MCHC 32.8 06/14/2022 1510   RDW 14.0 06/14/2022 1510   RDW 13.5 04/29/2017 0944   LYMPHSABS 3.3 06/14/2022 1510   LYMPHSABS 3.2 04/29/2017 0944   MONOABS 1.0 06/14/2022 1510   MONOABS 1.1 (H) 04/29/2017 0944   EOSABS 0.3 06/14/2022 1510   EOSABS 0.4 04/29/2017 0944   BASOSABS 0.1 06/14/2022 1510   BASOSABS 0.1 04/29/2017 0944    ***  Chest Imaging: CT Cardiac scoring 11/10 with RLL subsolid 38m nodule  Pulmonary Functions Testing Results:     No data to display          FeNO: ***  Pathology: ***  Echocardiogram: ***  Heart Catheterization: ***    Assessment & Plan:    Plan:      NMaryjane Hurter MD [facility] 08/16/2022 7:05 PM

## 2022-08-18 ENCOUNTER — Ambulatory Visit: Payer: 59 | Admitting: Student

## 2022-08-18 ENCOUNTER — Encounter: Payer: Self-pay | Admitting: Student

## 2022-08-18 VITALS — BP 124/74 | HR 74 | Temp 97.9°F | Ht 71.0 in | Wt 216.0 lb

## 2022-08-18 DIAGNOSIS — R911 Solitary pulmonary nodule: Secondary | ICD-10-CM | POA: Diagnosis not present

## 2022-08-18 NOTE — Patient Instructions (Signed)
-   you will be called to schedule CT Chest - we will set up follow up video visit in a month

## 2022-08-23 ENCOUNTER — Encounter: Payer: Self-pay | Admitting: Internal Medicine

## 2022-08-26 ENCOUNTER — Other Ambulatory Visit: Payer: Self-pay | Admitting: Internal Medicine

## 2022-08-26 DIAGNOSIS — E6609 Other obesity due to excess calories: Secondary | ICD-10-CM

## 2022-08-26 DIAGNOSIS — H8113 Benign paroxysmal vertigo, bilateral: Secondary | ICD-10-CM

## 2022-08-26 DIAGNOSIS — E538 Deficiency of other specified B group vitamins: Secondary | ICD-10-CM

## 2022-08-26 MED ORDER — SEMAGLUTIDE-WEIGHT MANAGEMENT 1 MG/0.5ML ~~LOC~~ SOAJ: 1.0000 mg | SUBCUTANEOUS | 0 refills | Status: DC

## 2022-08-26 MED ORDER — SEMAGLUTIDE-WEIGHT MANAGEMENT 1.7 MG/0.75ML ~~LOC~~ SOAJ: 1.7000 mg | SUBCUTANEOUS | 0 refills | Status: DC

## 2022-08-26 MED ORDER — SEMAGLUTIDE-WEIGHT MANAGEMENT 0.25 MG/0.5ML ~~LOC~~ SOAJ: 0.2500 mg | SUBCUTANEOUS | 0 refills | Status: AC

## 2022-08-26 MED ORDER — SEMAGLUTIDE-WEIGHT MANAGEMENT 0.5 MG/0.5ML ~~LOC~~ SOAJ: 0.5000 mg | SUBCUTANEOUS | 0 refills | Status: DC

## 2022-08-26 MED ORDER — SEMAGLUTIDE-WEIGHT MANAGEMENT 2.4 MG/0.75ML ~~LOC~~ SOAJ: 2.4000 mg | SUBCUTANEOUS | 0 refills | Status: DC

## 2022-09-21 ENCOUNTER — Other Ambulatory Visit: Payer: 59

## 2022-09-23 ENCOUNTER — Encounter: Payer: Self-pay | Admitting: Internal Medicine

## 2022-09-27 ENCOUNTER — Other Ambulatory Visit (HOSPITAL_COMMUNITY): Payer: Self-pay

## 2022-09-27 ENCOUNTER — Encounter: Payer: Self-pay | Admitting: Hematology

## 2022-09-27 NOTE — Telephone Encounter (Signed)
Patient Advocate Encounter   Received notification from [employer] that prior authorization for Sydney [name] is required.   PA submitted on 09/27/2022 Key [id number] Status is pending

## 2022-09-29 ENCOUNTER — Ambulatory Visit (INDEPENDENT_AMBULATORY_CARE_PROVIDER_SITE_OTHER): Payer: 59 | Admitting: *Deleted

## 2022-09-29 ENCOUNTER — Telehealth: Payer: Self-pay | Admitting: *Deleted

## 2022-09-29 DIAGNOSIS — D510 Vitamin B12 deficiency anemia due to intrinsic factor deficiency: Secondary | ICD-10-CM

## 2022-09-29 MED ORDER — CYANOCOBALAMIN 1000 MCG/ML IJ SOLN
1000.0000 ug | Freq: Once | INTRAMUSCULAR | Status: AC
  Administered 2022-09-29: 1000 ug via INTRAMUSCULAR

## 2022-09-29 MED ORDER — CYANOCOBALAMIN 1000 MCG/ML IJ SOLN: 1000.0000 ug | Freq: Once | INTRAMUSCULAR | 5 refills | Status: AC

## 2022-09-29 MED ORDER — "SYRINGE/NEEDLE (DISP) 25G X 5/8"" 1 ML MISC": 0 refills | Status: DC

## 2022-09-29 NOTE — Progress Notes (Signed)
Pls cosign for B12 inj../lmb  

## 2022-09-29 NOTE — Telephone Encounter (Signed)
Called pt no answer.. Sent B12 to pharmacy.Marland KitchenJohny Chess

## 2022-09-29 NOTE — Telephone Encounter (Signed)
Pt came in for her first B12 she is wanting to know how often does she need to take, and can she give injection to her self. Can send [facility] to [facility]../lmb

## 2022-09-29 NOTE — Telephone Encounter (Signed)
"  She wants to know how often does she need to take" monthly  "can she give injection to her self. [facility]../lmb" yes   TJ

## 2022-10-11 NOTE — Telephone Encounter (Signed)
Pharmacy Patient Advocate Encounter  Received notification from [place] that the request for prior authorization for [name] has been denied due to  .    Please be advised we currently do not have a Pharmacist to review denials, therefore you will need to process appeals accordingly as needed. Thanks for your support at this time.   You may call [phone] or fax [phone], to appeal.

## 2022-10-12 ENCOUNTER — Other Ambulatory Visit: Payer: Self-pay | Admitting: Internal Medicine

## 2022-10-12 DIAGNOSIS — E6609 Other obesity due to excess calories: Secondary | ICD-10-CM

## 2022-10-12 MED ORDER — SEMAGLUTIDE-WEIGHT MANAGEMENT 1 MG/0.5ML ~~LOC~~ SOAJ: 1.0000 mg | SUBCUTANEOUS | 0 refills | Status: DC

## 2022-10-12 MED ORDER — SEMAGLUTIDE-WEIGHT MANAGEMENT 1.7 MG/0.75ML ~~LOC~~ SOAJ: 1.7000 mg | SUBCUTANEOUS | 0 refills | Status: DC

## 2022-10-12 MED ORDER — SEMAGLUTIDE-WEIGHT MANAGEMENT 2.4 MG/0.75ML ~~LOC~~ SOAJ: 2.4000 mg | SUBCUTANEOUS | 1 refills | Status: DC

## 2022-10-15 ENCOUNTER — Other Ambulatory Visit: Payer: Self-pay | Admitting: Internal Medicine

## 2022-10-15 ENCOUNTER — Ambulatory Visit
Admission: RE | Admit: 2022-10-15 | Discharge: 2022-10-15 | Disposition: A | Discharge status: Home / Self Care | Payer: 59 | Source: Ambulatory Visit | Attending: Student | Admitting: Student

## 2022-10-15 DIAGNOSIS — R911 Solitary pulmonary nodule: Secondary | ICD-10-CM

## 2022-10-15 DIAGNOSIS — E6609 Other obesity due to excess calories: Secondary | ICD-10-CM

## 2022-10-15 MED ORDER — ZEPBOUND 2.5 MG/0.5ML ~~LOC~~ SOAJ: 2.5000 mg | SUBCUTANEOUS | 0 refills | Status: DC

## 2022-10-18 ENCOUNTER — Other Ambulatory Visit: Payer: 59

## 2022-11-14 ENCOUNTER — Encounter: Payer: Self-pay | Admitting: Student

## 2022-11-14 DIAGNOSIS — R911 Solitary pulmonary nodule: Secondary | ICD-10-CM

## 2022-11-15 NOTE — Telephone Encounter (Signed)
Received a message from patient asking for her CT scan results. She had the CT on 10/15/22.   Dr. Verlee Monte, can you please advise? Thanks!

## 2022-12-06 ENCOUNTER — Ambulatory Visit (INDEPENDENT_AMBULATORY_CARE_PROVIDER_SITE_OTHER): Payer: 59 | Admitting: *Deleted

## 2022-12-06 ENCOUNTER — Ambulatory Visit: Payer: 59 | Admitting: Internal Medicine

## 2022-12-06 DIAGNOSIS — Z23 Encounter for immunization: Secondary | ICD-10-CM

## 2022-12-06 NOTE — Progress Notes (Signed)
Pls cosign for SHINGRIX inj../lmb  

## 2022-12-13 ENCOUNTER — Other Ambulatory Visit: Payer: Self-pay | Admitting: Internal Medicine

## 2022-12-13 DIAGNOSIS — E6609 Other obesity due to excess calories: Secondary | ICD-10-CM

## 2022-12-13 MED ORDER — ZEPBOUND 5 MG/0.5ML ~~LOC~~ SOAJ: 5.0000 mg | SUBCUTANEOUS | 0 refills | Status: DC

## 2022-12-19 ENCOUNTER — Emergency Department (HOSPITAL_COMMUNITY): Payer: 59

## 2022-12-19 ENCOUNTER — Encounter (HOSPITAL_COMMUNITY): Payer: Self-pay | Admitting: *Deleted

## 2022-12-19 ENCOUNTER — Emergency Department (HOSPITAL_COMMUNITY)
Admission: EM | Admit: 2022-12-19 | Discharge: 2022-12-19 | Disposition: A | Discharge status: Home / Self Care | Payer: 59 | Attending: Emergency Medicine | Admitting: Emergency Medicine

## 2022-12-19 DIAGNOSIS — W19XXXA Unspecified fall, initial encounter: Secondary | ICD-10-CM | POA: Insufficient documentation

## 2022-12-19 DIAGNOSIS — S62102A Fracture of unspecified carpal bone, left wrist, initial encounter for closed fracture: Secondary | ICD-10-CM

## 2022-12-19 DIAGNOSIS — S52502A Unspecified fracture of the lower end of left radius, initial encounter for closed fracture: Secondary | ICD-10-CM | POA: Diagnosis not present

## 2022-12-19 DIAGNOSIS — S6992XA Unspecified injury of left wrist, hand and finger(s), initial encounter: Secondary | ICD-10-CM | POA: Diagnosis present

## 2022-12-19 DIAGNOSIS — S52602A Unspecified fracture of lower end of left ulna, initial encounter for closed fracture: Secondary | ICD-10-CM | POA: Insufficient documentation

## 2022-12-19 MED ORDER — KETOROLAC TROMETHAMINE 30 MG/ML IJ SOLN
30.0000 mg | Freq: Once | INTRAMUSCULAR | Status: AC
  Administered 2022-12-19: 30 mg via INTRAMUSCULAR
  Filled 2022-12-19: qty 1

## 2022-12-19 MED ORDER — OXYCODONE-ACETAMINOPHEN 5-325 MG PO TABS
1.0000 | ORAL_TABLET | Freq: Once | ORAL | Status: AC
  Administered 2022-12-19: 1 via ORAL
  Filled 2022-12-19: qty 1

## 2022-12-19 MED ORDER — ONDANSETRON 4 MG PO TBDP
4.0000 mg | ORAL_TABLET | Freq: Once | ORAL | Status: AC
  Administered 2022-12-19: 4 mg via ORAL
  Filled 2022-12-19: qty 1

## 2022-12-19 MED ORDER — HYDROCODONE-ACETAMINOPHEN 5-325 MG PO TABS: 1.0000 | ORAL_TABLET | Freq: Four times a day (QID) | ORAL | 0 refills | Status: DC | PRN

## 2022-12-19 MED ORDER — HYDROMORPHONE HCL 1 MG/ML IJ SOLN
0.5000 mg | Freq: Once | INTRAMUSCULAR | Status: AC
  Administered 2022-12-19: 0.5 mg via INTRAMUSCULAR
  Filled 2022-12-19: qty 1

## 2022-12-19 NOTE — ED Triage Notes (Signed)
Portable xrays of the lt wrist  done

## 2022-12-19 NOTE — ED Triage Notes (Signed)
Fell backward earlier today and fell onto her  lt wirst  deformity to the area  good radial pulse  ice pcks to the extremity

## 2022-12-19 NOTE — Discharge Instructions (Addendum)
You have been seen and discharged from the emergency department.  You have a left wrist fracture.  This has been splinted in the emergency department.  Hand surgery was contacted, their office will be calling you tomorrow.  Take pain medicine as needed.  Do not mix this medication with alcohol or other sedating medications. Do not drive or do heavy physical activity until you know how this medication affects you.  It may cause drowsiness.  Follow-up with your primary provider for further evaluation and further care. Take home medications as prescribed. If you have any worsening symptoms or further concerns for your health please return to an emergency department for further evaluation.

## 2022-12-19 NOTE — ED Provider Notes (Signed)
[facility] AT [facility] Provider Note   CSN: [id number] Arrival date & time: 12/19/22  1734     History  Chief Complaint  Patient presents with   Wrist Pain    Sydney [name] is a 57 y.o. female.  HPI   57 year old female presents emergency department with fall and left wrist injury.  Patient is right-hand dominant.  Patient states that she lost her balance, was falling backwards and landed on her left wrist.  She had immediate pain and popping sensation.  Denies any other injury.  No syncope.  Home Medications Prior to Admission medications   Medication Sig Start Date End Date Taking? Authorizing Provider  b complex vitamins capsule Take 1 capsule by mouth daily. 10/01/16   Johney Maine, MD  Cholecalciferol 50 MCG (2000 UT) TABS Take 1 tablet (2,000 Units total) by mouth daily. 06/19/22   Etta Grandchild, MD  folic acid (FOLVITE) 1 MG tablet TAKE 1 TABLET(1 MG) BY MOUTH DAILY 08/26/22   Etta Grandchild, MD  iron polysaccharides (NIFEREX) 150 MG capsule Take 1 capsule (150 mg total) by mouth daily. 12/25/18   Johney Maine, MD  levocetirizine (XYZAL) 5 MG tablet Take 2 tablets (10 mg total) by mouth every evening. 05/24/19   Etta Grandchild, MD  meclizine (ANTIVERT) 25 MG tablet TAKE 1 TABLET(25 MG) BY MOUTH TWICE DAILY AS NEEDED FOR DIZZINESS 08/26/22   Etta Grandchild, MD  montelukast (SINGULAIR) 10 MG tablet Take 1 tablet (10 mg total) by mouth at bedtime. 05/24/19   Etta Grandchild, MD  Multiple Vitamin (MULTIVITAMIN) tablet Take 1 tablet by mouth 2 (two) times daily.    [provider]  SYRINGE/NEEDLE, DISP, 1 ML 25G X 5/8" 1 ML MISC Use monthly to administer B12 in intramuscular 09/29/22   Etta Grandchild, MD  tirzepatide [facility]) 5 MG/0.5ML Pen Inject 5 mg into the skin once a week. 12/13/22   Etta Grandchild, MD  Vitamin D, Ergocalciferol, (DRISDOL) 1.25 MG (50000 UNIT) CAPS capsule TAKE 1 CAPSULE BY MOUTH EVERY 7 DAYS AS  DIRECTED 07/07/21   Etta Grandchild, MD  zinc gluconate 50 MG tablet Take 1 tablet (50 mg total) by mouth daily. 06/18/22   Etta Grandchild, MD      Allergies    Codeine    Review of Systems   Review of Systems  Constitutional:  Negative for fever.  Cardiovascular:  Negative for chest pain.  Musculoskeletal:        + Left wrist pain  Neurological:  Negative for syncope, weakness, numbness and headaches.    Physical Exam Updated Vital Signs BP 112/72 (BP Location: Right Arm)   Pulse 76   Temp 98.4 F (36.9 C) (Oral)   Resp 18   Ht 5\' 11"  (1.803 m)   Wt 98 kg   SpO2 99%   BMI 30.13 kg/m  Physical Exam Vitals and nursing note reviewed.  Constitutional:      Appearance: Normal appearance.  HENT:     Head: Normocephalic.     Mouth/Throat:     Mouth: Mucous membranes are moist.  Cardiovascular:     Rate and Rhythm: Normal rate.  Pulmonary:     Effort: Pulmonary effort is normal. No respiratory distress.  Musculoskeletal:     Comments: Swelling to the left wrist, diffusely tender, equal palpable radial pulses, hand is neurovascularly intact  Skin:    General: Skin is warm.  Neurological:  Mental Status: She is alert and oriented to person, place, and time. Mental status is at baseline.  Psychiatric:        Mood and Affect: Mood normal.     ED Results / Procedures / Treatments   Labs (all labs ordered are listed, but only abnormal results are displayed) Labs Reviewed - No data to display  EKG None  Radiology DG Wrist Complete Left  Result Date: 12/19/2022 CLINICAL DATA:  Fall, left wrist deformity EXAM: LEFT WRIST - COMPLETE 3+ VIEW COMPARISON:  None Available. FINDINGS: Comminuted intra-articular distal radial fracture with apex volar angulation. Mildly displaced ulnar styloid fracture. Associated mild soft tissue swelling. IMPRESSION: Distal radial and ulnar fractures, as above. Electronically Signed   By: Charline Bills M.D.   On: 12/19/2022 19:03     Procedures Procedures    Medications Ordered in ED Medications  oxyCODONE-acetaminophen (PERCOCET/ROXICET) 5-325 MG per tablet 1 tablet (1 tablet Oral Given 12/19/22 1753)  oxyCODONE-acetaminophen (PERCOCET/ROXICET) 5-325 MG per tablet 1 tablet (1 tablet Oral Given 12/19/22 2022)  ondansetron (ZOFRAN-ODT) disintegrating tablet 4 mg (4 mg Oral Given 12/19/22 2021)    ED Course/ Medical Decision Making/ A&P                             Medical Decision Making Risk Prescription drug management.   57 year old right-hand-dominant female presents emergency department for mechanical fall and left wrist injury.  Noted swelling and deformity to the left wrist.  Vitals are stable on arrival.  No anticoagulation.  X-ray shows distal ulnar and radius fracture, with the radius fracture being comminuted intra-articular.  Consulted with hand surgeon, Dr. Janee Morn and he agrees with sugar-tong splint, pain control and outpatient follow-up in the office.  Splint placed by Ortho tech, hand remains neurovascularly intact.  Will plan for pain control and outpatient follow-up with hand surgery.  Patient at this time appears safe and stable for discharge and close outpatient follow up. Discharge plan and strict return to ED precautions discussed, patient verbalizes understanding and agreement.        Final Clinical Impression(s) / ED Diagnoses Final diagnoses:  None    Rx / DC Orders ED Discharge Orders     None         Rozelle Logan, DO 12/19/22 2110

## 2022-12-19 NOTE — Progress Notes (Signed)
Orthopedic Tech Progress Note Patient Details:  Sydney [name] [birth year] [id number]  Ortho Devices Type of Ortho Device: Arm sling, Sugartong splint Ortho Device/Splint Location: lue Ortho Device/Splint Interventions: Ordered, Application, Adjustment  The patient assisted with holding up the arm while I was splinting. Post Interventions Patient Tolerated: Well Instructions Provided: Care of device, Adjustment of device  Trinna Post 12/19/2022, 9:38 PM

## 2022-12-19 NOTE — ED Notes (Signed)
Ortho [employer] for splint application

## 2022-12-20 ENCOUNTER — Telehealth: Payer: Self-pay

## 2022-12-20 NOTE — Transitions of Care (Post Inpatient/ED Visit) (Signed)
   12/20/2022  Name: Sydney [name] MRN: [id number] DOB: 1966-08-08  Today's TOC FU Call Status: Today's TOC FU Call Status:: Successful TOC FU Call Competed TOC FU Call Complete Date: 12/20/22  Transition Care Management Follow-up Telephone Call Date of Discharge: 12/19/22 Discharge Facility: [facility]) Type of Discharge: Emergency Department Reason for ED Visit: Other: (fracture wrist) How have you been since you were released from the hospital?: Same Any questions or concerns?: No  Items Reviewed: Did you receive and understand the discharge instructions provided?: Yes Medications obtained and verified?: Yes (Medications Reviewed) Any new allergies since your discharge?: No Dietary orders reviewed?: Yes Do you have support at home?: Yes People in Home: spouse  Home Care and Equipment/Supplies: Were Home Health Services Ordered?: NA Any new equipment or medical supplies ordered?: NA  Functional Questionnaire: Do you need assistance with bathing/showering or dressing?: Yes Do you need assistance with meal preparation?: Yes Do you need assistance with eating?: No Do you have difficulty maintaining continence: No Do you need assistance with getting out of bed/getting out of a chair/moving?: No Do you have difficulty managing or taking your medications?: No  Follow up appointments reviewed: PCP Follow-up appointment confirmed?: NA Specialist Hospital Follow-up appointment confirmed?: Yes Date of Specialist follow-up appointment?: 12/21/22 Follow-Up Specialty Provider:: Dr Janee Morn Do you need transportation to your follow-up appointment?: No Do you understand care options if your condition(s) worsen?: Yes-patient verbalized understanding    SIGNATURE Karena Addison, LPN [facility] Nurse Health Advisor Direct Dial [phone]

## 2022-12-26 ENCOUNTER — Other Ambulatory Visit: Payer: Self-pay | Admitting: Internal Medicine

## 2022-12-26 DIAGNOSIS — E60 Dietary zinc deficiency: Secondary | ICD-10-CM

## 2022-12-27 ENCOUNTER — Other Ambulatory Visit: Payer: Self-pay | Admitting: Internal Medicine

## 2023-01-14 ENCOUNTER — Other Ambulatory Visit: Payer: Self-pay | Admitting: Internal Medicine

## 2023-01-14 DIAGNOSIS — E6609 Other obesity due to excess calories: Secondary | ICD-10-CM

## 2023-07-05 ENCOUNTER — Ambulatory Visit: Payer: 59 | Admitting: Internal Medicine

## 2023-07-05 ENCOUNTER — Encounter: Payer: Self-pay | Admitting: Internal Medicine

## 2023-07-05 VITALS — BP 114/74 | HR 72 | Temp 98.0°F | Resp 16 | Ht 71.0 in | Wt 208.2 lb

## 2023-07-05 DIAGNOSIS — E559 Vitamin D deficiency, unspecified: Secondary | ICD-10-CM

## 2023-07-05 DIAGNOSIS — E60 Dietary zinc deficiency: Secondary | ICD-10-CM | POA: Diagnosis not present

## 2023-07-05 DIAGNOSIS — D510 Vitamin B12 deficiency anemia due to intrinsic factor deficiency: Secondary | ICD-10-CM

## 2023-07-05 DIAGNOSIS — M85839 Other specified disorders of bone density and structure, unspecified forearm: Secondary | ICD-10-CM | POA: Diagnosis not present

## 2023-07-05 DIAGNOSIS — Z6830 Body mass index (BMI) 30.0-30.9, adult: Secondary | ICD-10-CM

## 2023-07-05 DIAGNOSIS — E538 Deficiency of other specified B group vitamins: Secondary | ICD-10-CM | POA: Diagnosis not present

## 2023-07-05 DIAGNOSIS — Z23 Encounter for immunization: Secondary | ICD-10-CM | POA: Diagnosis not present

## 2023-07-05 DIAGNOSIS — Z0000 Encounter for general adult medical examination without abnormal findings: Secondary | ICD-10-CM | POA: Diagnosis not present

## 2023-07-05 DIAGNOSIS — E6609 Other obesity due to excess calories: Secondary | ICD-10-CM

## 2023-07-05 DIAGNOSIS — Z9884 Bariatric surgery status: Secondary | ICD-10-CM

## 2023-07-05 DIAGNOSIS — E66811 Obesity, class 1: Secondary | ICD-10-CM

## 2023-07-05 DIAGNOSIS — Z124 Encounter for screening for malignant neoplasm of cervix: Secondary | ICD-10-CM

## 2023-07-05 DIAGNOSIS — E519 Thiamine deficiency, unspecified: Secondary | ICD-10-CM

## 2023-07-05 DIAGNOSIS — D508 Other iron deficiency anemias: Secondary | ICD-10-CM

## 2023-07-05 DIAGNOSIS — E213 Hyperparathyroidism, unspecified: Secondary | ICD-10-CM

## 2023-07-05 DIAGNOSIS — Z0001 Encounter for general adult medical examination with abnormal findings: Secondary | ICD-10-CM

## 2023-07-05 LAB — HEPATIC FUNCTION PANEL
ALT: 24 U/L (ref 0–35)
AST: 21 U/L (ref 0–37)
Albumin: 3.9 g/dL (ref 3.5–5.2)
Alkaline Phosphatase: 122 U/L — ABNORMAL HIGH (ref 39–117)
Bilirubin, Direct: 0 mg/dL (ref 0.0–0.3)
Total Bilirubin: 0.3 mg/dL (ref 0.2–1.2)
Total Protein: 6.9 g/dL (ref 6.0–8.3)

## 2023-07-05 LAB — CBC WITH DIFFERENTIAL/PLATELET
Basophils Absolute: 0.1 10*3/uL (ref 0.0–0.1)
Basophils Relative: 1 % (ref 0.0–3.0)
Eosinophils Absolute: 0.4 10*3/uL (ref 0.0–0.7)
Eosinophils Relative: 3.8 % (ref 0.0–5.0)
HCT: 40.5 % (ref 36.0–46.0)
Hemoglobin: 12.8 g/dL (ref 12.0–15.0)
Lymphocytes Relative: 30.5 % (ref 12.0–46.0)
Lymphs Abs: 3.1 10*3/uL (ref 0.7–4.0)
MCHC: 31.6 g/dL (ref 30.0–36.0)
MCV: 91.2 fL (ref 78.0–100.0)
Monocytes Absolute: 1 10*3/uL (ref 0.1–1.0)
Monocytes Relative: 10.1 % (ref 3.0–12.0)
Neutro Abs: 5.6 10*3/uL (ref 1.4–7.7)
Neutrophils Relative %: 54.6 % (ref 43.0–77.0)
Platelets: 419 10*3/uL — ABNORMAL HIGH (ref 150.0–400.0)
RBC: 4.44 Mil/uL (ref 3.87–5.11)
RDW: 14.4 % (ref 11.5–15.5)
WBC: 10.3 10*3/uL (ref 4.0–10.5)

## 2023-07-05 LAB — LIPID PANEL
Cholesterol: 164 mg/dL (ref 0–200)
HDL: 46.8 mg/dL (ref >39.00)
LDL Cholesterol: 104 mg/dL — ABNORMAL HIGH (ref 0–99)
NonHDL: 117.14
Total CHOL/HDL Ratio: 4
Triglycerides: 68 mg/dL (ref 0.0–149.0)
VLDL: 13.6 mg/dL (ref 0.0–40.0)

## 2023-07-05 LAB — BASIC METABOLIC PANEL
BUN: 18 mg/dL (ref 6–23)
CO2: 29 meq/L (ref 19–32)
Calcium: 9 mg/dL (ref 8.4–10.5)
Chloride: 108 meq/L (ref 96–112)
Creatinine, Ser: 0.82 mg/dL (ref 0.40–1.20)
GFR: 79.99 mL/min (ref >60.00)
Glucose, Bld: 105 mg/dL — ABNORMAL HIGH (ref 70–99)
Potassium: 4.4 meq/L (ref 3.5–5.1)
Sodium: 141 meq/L (ref 135–145)

## 2023-07-05 LAB — VITAMIN D 25 HYDROXY (VIT D DEFICIENCY, FRACTURES): VITD: 24.07 ng/mL — ABNORMAL LOW (ref 30.00–100.00)

## 2023-07-05 LAB — VITAMIN B12: Vitamin B-12: 386 pg/mL (ref 211–911)

## 2023-07-05 LAB — FOLATE: Folate: 9 ng/mL (ref >5.9)

## 2023-07-05 MED ORDER — CVS D3 50 MCG (2000 UT) PO CAPS: 1.0000 | ORAL_CAPSULE | Freq: Every day | ORAL | 1 refills | Status: DC

## 2023-07-05 NOTE — Patient Instructions (Signed)

## 2023-07-05 NOTE — Progress Notes (Signed)
Subjective:  Patient ID: Sydney [name], female    DOB: [birth year]  Age: 57 y.o. MRN: [id number]  CC: Annual Exam and Anemia   HPI EVELIA WASKEY presents for a CPX and f/up ---  Discussed the use of AI scribe software for clinical note transcription with the patient, who gave verbal consent to proceed.  History of Present Illness   The patient, with a history of vertigo, reports no recent episodes of dizziness or lightheadedness, with the last episode occurring approximately a month and a half ago. She notes that the vertigo is sometimes triggered by certain head movements. She denies any chest pain or shortness of breath and maintains an active lifestyle.  She reports no changes in her medication regimen and denies any symptoms of gastrointestinal disease such as abdominal pain, constipation, cramping, diarrhea, or blood in the stool. She completed a Cologuard test three years ago and is due for a repeat this year.  Her last Pap smear was three years ago. She reports no noticeable symptoms from a slight increase in her platelet count observed during her last visit, such as night sweats, lymph nodes, or chills.       Outpatient Medications Prior to Visit  Medication Sig Dispense Refill   b complex vitamins capsule Take 1 capsule by mouth daily.     cyanocobalamin (VITAMIN B12) 1000 MCG/ML injection Inject 1,000 mcg into the muscle every 30 (thirty) days.     folic acid (FOLVITE) 1 MG tablet TAKE 1 TABLET(1 MG) BY MOUTH DAILY 90 tablet 1   iron polysaccharides (NIFEREX) 150 MG capsule Take 1 capsule (150 mg total) by mouth daily.     levocetirizine (XYZAL) 5 MG tablet Take 2 tablets (10 mg total) by mouth every evening. (Patient taking differently: Take 10 mg by mouth as needed for allergies.) 180 tablet 0   meclizine (ANTIVERT) 25 MG tablet TAKE 1 TABLET(25 MG) BY MOUTH TWICE DAILY AS NEEDED FOR DIZZINESS 65 tablet 2   meloxicam (MOBIC) 15 MG tablet Take 15 mg by mouth as needed for  pain. Wrist and elbow     montelukast (SINGULAIR) 10 MG tablet Take 1 tablet (10 mg total) by mouth at bedtime. (Patient taking differently: Take 10 mg by mouth as needed.) 90 tablet 0   Multiple Vitamin (MULTIVITAMIN) tablet Take 1 tablet by mouth 2 (two) times daily.     zinc gluconate (CVS ZINC GLUCONATE) 50 MG tablet TAKE 1 TABLET EVERY DAY 100 tablet 1   Cholecalciferol 50 MCG (2000 UT) TABS Take 1 tablet (2,000 Units total) by mouth daily. 90 tablet 1   CVS D3 50 MCG (2000 UT) CAPS TAKE 1 CAPSULE EVERY DAY 100 capsule 1   Vitamin D, Ergocalciferol, (DRISDOL) 1.25 MG (50000 UNIT) CAPS capsule TAKE 1 CAPSULE BY MOUTH EVERY 7 DAYS AS DIRECTED 12 capsule 0   HYDROcodone-acetaminophen (NORCO) 5-325 MG tablet Take 1 tablet by mouth every 6 (six) hours as needed for moderate pain. 15 tablet 0   SYRINGE/NEEDLE, DISP, 1 ML 25G X 5/8" 1 ML MISC Use monthly to administer B12 in intramuscular (Patient not taking: Reported on 07/05/2023) 6 each 0   tirzepatide (ZEPBOUND) 5 MG/0.5ML Pen Inject 5 mg into the skin once a week. (Patient not taking: Reported on 07/05/2023) 4 mL 0   No facility-administered medications prior to visit.    ROS Review of Systems  Constitutional:  Negative for appetite change, diaphoresis, fatigue and unexpected weight change.  HENT: Negative.  Eyes: Negative.  Negative for visual disturbance.  Respiratory:  Negative for cough, chest tightness, shortness of breath and wheezing.   Cardiovascular:  Negative for chest pain, palpitations and leg swelling.  Gastrointestinal:  Negative for abdominal pain, constipation, diarrhea, nausea and vomiting.  Genitourinary: Negative.  Negative for difficulty urinating.  Musculoskeletal: Negative.  Negative for back pain and myalgias.  Skin: Negative.   Neurological: Negative.  Negative for dizziness and weakness.  Hematological:  Negative for adenopathy. Does not bruise/bleed easily.  Psychiatric/Behavioral: Negative.       Objective:  BP 114/74 (BP Location: Left Arm, Patient Position: Sitting, Cuff Size: Normal)   Pulse 72   Temp 98 F (36.7 C) (Oral)   Resp 16   Ht 5\' 11"  (1.803 m)   Wt 208 lb 3.2 oz (94.4 kg)   SpO2 98%   BMI 29.04 kg/m   BP Readings from Last 3 Encounters:  07/05/23 114/74  12/19/22 116/74  08/18/22 124/74    Wt Readings from Last 3 Encounters:  07/05/23 208 lb 3.2 oz (94.4 kg)  12/19/22 216 lb 0.8 oz (98 kg)  08/18/22 216 lb (98 kg)    Physical Exam Vitals reviewed.  Constitutional:      Appearance: Normal appearance.  HENT:     Mouth/Throat:     Mouth: Mucous membranes are moist.  Eyes:     General: No scleral icterus.    Conjunctiva/sclera: Conjunctivae normal.  Cardiovascular:     Rate and Rhythm: Normal rate and regular rhythm.     Heart sounds: No murmur heard.    No gallop.  Pulmonary:     Effort: Pulmonary effort is normal.     Breath sounds: No stridor. No wheezing, rhonchi or rales.  Abdominal:     General: Abdomen is flat.     Palpations: There is no mass.     Tenderness: There is no abdominal tenderness. There is no guarding.     Hernia: No hernia is present.  Musculoskeletal:        General: Normal range of motion.     Cervical back: Neck supple.     Right lower leg: No edema.     Left lower leg: No edema.  Lymphadenopathy:     Cervical: No cervical adenopathy.  Skin:    General: Skin is warm and dry.  Neurological:     General: No focal deficit present.     Mental Status: She is alert. Mental status is at baseline.  Psychiatric:        Mood and Affect: Mood normal.        Behavior: Behavior normal.     Lab Results  Component Value Date   WBC 10.3 07/05/2023   HGB 12.8 07/05/2023   HCT 40.5 07/05/2023   PLT 419.0 (H) 07/05/2023   GLUCOSE 105 (H) 07/05/2023   CHOL 164 07/05/2023   TRIG 68.0 07/05/2023   HDL 46.80 07/05/2023   LDLCALC 104 (H) 07/05/2023   ALT 24 07/05/2023   AST 21 07/05/2023   NA 141 07/05/2023   K 4.4  07/05/2023   CL 108 07/05/2023   CREATININE 0.82 07/05/2023   BUN 18 07/05/2023   CO2 29 07/05/2023   TSH 0.91 01/15/2021   INR 1.20 03/07/2016   HGBA1C 5.1 01/15/2021    DG Wrist Complete Left  Result Date: 12/19/2022 CLINICAL DATA:  Fall, left wrist deformity EXAM: LEFT WRIST - COMPLETE 3+ VIEW COMPARISON:  None Available. FINDINGS: Comminuted intra-articular distal radial fracture with  apex volar angulation. Mildly displaced ulnar styloid fracture. Associated mild soft tissue swelling. IMPRESSION: Distal radial and ulnar fractures, as above. Electronically Signed   By: Charline Bills M.D.   On: 12/19/2022 19:03    Assessment & Plan:  Folate deficiency -     Vitamin B12; Future -     CBC with Differential/Platelet; Future -     Folate; Future  Vitamin B12 deficiency anemia due to intrinsic factor deficiency -     Vitamin B12; Future  Chronic zinc deficiency -     Zinc; Future  Osteopenia of forearm, unspecified laterality -     VITAMIN D 25 Hydroxy (Vit-D Deficiency, Fractures); Future  Thiamine deficiency -     Vitamin B1; Future  Hyperparathyroidism (HCC)- Ca++ is normal now. -     Basic metabolic panel; Future -     VITAMIN D 25 Hydroxy (Vit-D Deficiency, Fractures); Future -     PTH, intact and calcium; Future -     Hepatic function panel; Future  Encounter for general adult medical examination with abnormal findings- Exam completed, labs reviewed, vaccines reviewed and updated, cancer screenings addressed, pt ed material was given.  -     Lipid panel; Future  Bariatric surgery status -     Vitamin B12; Future -     VITAMIN D 25 Hydroxy (Vit-D Deficiency, Fractures); Future -     Vitamin B1; Future -     Zinc; Future  Need for immunization against influenza -     Flu vaccine trivalent PF, 6mos and older(Flulaval,Afluria,Fluarix,Fluzone)  Vitamin D deficiency -     CVS D3; Take 1 capsule (2,000 Units total) by mouth daily.  Dispense: 100 capsule; Refill:  1  Class 1 obesity due to excess calories with serious comorbidity and body mass index (BMI) of 30.0 to 30.9 in adult -     Phentermine HCl; Take 1 capsule (30 mg total) by mouth every morning.  Dispense: 90 capsule; Refill: 0  Cervical cancer screening -     Ambulatory referral to Gynecology     Follow-up: Return in about 6 months (around 01/03/2024).  Sanda Linger, MD

## 2023-07-06 ENCOUNTER — Other Ambulatory Visit: Payer: Self-pay

## 2023-07-06 DIAGNOSIS — Z23 Encounter for immunization: Secondary | ICD-10-CM | POA: Insufficient documentation

## 2023-07-06 MED ORDER — PHENTERMINE HCL 30 MG PO CAPS
30.0000 mg | ORAL_CAPSULE | ORAL | 0 refills | Status: DC
Start: 2023-07-06 — End: 2023-10-10

## 2023-07-08 DIAGNOSIS — Z124 Encounter for screening for malignant neoplasm of cervix: Secondary | ICD-10-CM | POA: Insufficient documentation

## 2023-07-12 LAB — PTH, INTACT AND CALCIUM
Calcium: 9.1 mg/dL (ref 8.6–10.4)
PTH: 103 pg/mL — ABNORMAL HIGH (ref 16–77)

## 2023-07-12 LAB — ZINC: Zinc: 61 ug/dL (ref 60–130)

## 2023-07-12 LAB — VITAMIN B1: Vitamin B1 (Thiamine): 17 nmol/L (ref 8–30)

## 2023-08-18 ENCOUNTER — Ambulatory Visit (INDEPENDENT_AMBULATORY_CARE_PROVIDER_SITE_OTHER): Payer: 59

## 2023-08-18 ENCOUNTER — Other Ambulatory Visit: Payer: Self-pay

## 2023-08-18 ENCOUNTER — Encounter: Payer: Self-pay | Admitting: Family Medicine

## 2023-08-18 ENCOUNTER — Ambulatory Visit: Payer: 59 | Admitting: Family Medicine

## 2023-08-18 VITALS — BP 124/72 | HR 107 | Ht 71.0 in | Wt 211.0 lb

## 2023-08-18 DIAGNOSIS — Z780 Asymptomatic menopausal state: Secondary | ICD-10-CM | POA: Diagnosis not present

## 2023-08-18 DIAGNOSIS — M25522 Pain in left elbow: Secondary | ICD-10-CM

## 2023-08-18 MED ORDER — MELOXICAM 15 MG PO TABS: 15.0000 mg | ORAL_TABLET | ORAL | 0 refills | Status: DC | PRN

## 2023-08-18 NOTE — Patient Instructions (Addendum)
Thank you for coming in today.   I've referred you to Occupational Therapy.  Let us know if you don't hear from them in one week.   Cubital tunnel wrist brace  Schedule your DEXA up front before you leave  OK to take the Gabapentin and Meloxicam  Check back in 6 weeks

## 2023-08-18 NOTE — Progress Notes (Signed)
I, Stevenson Clinch, CMA acting as a scribe for [name], MD.  Sydney [name] is a 57 y.o. female who presents to [facility] at [facility] today for L elbow pain. Pt was previously seen by Dr. Katrinka Blazing for R shoulder pain.  Today, pt c/o L elbow pain x 2-3 months. Sx started at the time of wrist fracture, pt states that only the wrist fracture was addressed by Ortho Surg. She notes a hx of L distal radius and ulnar fx in April. Pt locates pain to lateral aspect of the elbow. At times the 4th and 5th fingers will feel jammed. Sx causing night disturbance.   Radiates: into the lower arm to the wrist and 4th/5th fingers Paresthesia: 4th/5th fingers Grip strength: decreased, dropping things Aggravates: gripping, grasping, use in general Treatments tried: Meloxicam.  Was prescribed gabapentin but has not started it.  Pertinent review of systems: No fevers or chills  Relevant historical information: No history of osteoporosis.  Hyperparathyroidism and low vitamin D present.   Exam:  BP 124/72   Pulse (!) 107   Ht 5\' 11"  (1.803 m)   Wt 211 lb (95.7 kg)   SpO2 98%   BMI 29.43 kg/m  General: Well Developed, well nourished, and in no acute distress.   MSK: Left elbow tender to palpation lateral elbow normal-appearing otherwise. Normal motion. Pain with resisted wrist extension. Positive Tinel's at cubital tunnel    Lab and Radiology Results  Diagnostic Limited MSK Ultrasound of: Left elbow Hypoechoic fluid tracking within common extensor tendon origin at lateral epicondyle consistent with tendinitis or possible tiny tear.  No fracture visible at the lateral epicondyle or radial head. Medial elbow cubital tunnel visualized.  No subluxation of the ulnar nerve with elbow flexion but the nerve does look to be compressed against the superficial portion of the cubital tunnel with elbow flexion. Impression: Lateral epicondylitis and cubital tunnel syndrome.   X-ray  images left elbow obtained today personally and independently interpreted No acute or chronic fractures are visible. Await formal radiology review   Assessment and Plan: 57 y.o. female with chronic left elbow pain worsening after a fall bad enough to cause a wrist fracture about 4 months ago.  X-ray today per my review did not show any fractures.  Radiology overread is still pending.  For now we will treat with hand therapy focused on the elbow and cubital tunnel wrist brace at bedtime.  Reassess in 4 to 6 weeks for recheck.  Additionally she had a fall from standing that caused a wrist fracture.  In her chart she does have a history of osteopenia with the most recent bone density test in 2019.  Will get an updated bone density DEXA scan to assess for osteoporosis given this fragility fracture.  Meloxicam refilled.  Talked about safety this is medication.  PDMP not reviewed this encounter. Orders Placed This Encounter  Procedures   [place] LIMITED JOINT SPACE STRUCTURES UP LEFT(NO LINKED CHARGES)    Reason for Exam (SYMPTOM  OR DIAGNOSIS REQUIRED):   left elbow pain    Preferred imaging location?:   [facility] ELBOW COMPLETE LEFT (3+VIEW)    Standing Status:   Future    Number of Occurrences:   1    Expiration Date:   09/18/2023    Reason for Exam (SYMPTOM  OR DIAGNOSIS REQUIRED):   left elbow pain    Preferred imaging location?:   [facility]  Is patient pregnant?:   No   DG BONE DENSITY (DXA)    Standing Status:   Future    Expiration Date:   08/17/2024    Reason for Exam (SYMPTOM  OR DIAGNOSIS REQUIRED):   post menopausal hx frag frac    Is patient pregnant?:   No    Preferred imaging location?:   -[address]   Ambulatory referral to Occupational Therapy    Referral Priority:   Routine    Referral Type:   Occupational Therapy    Referral Reason:   Specialty Services Required    Requested Specialty:   Occupational Therapy    Number of  Visits Requested:   1   Meds ordered this encounter  Medications   meloxicam (MOBIC) 15 MG tablet    Sig: Take 1 tablet (15 mg total) by mouth as needed for pain. Wrist and elbow    Dispense:  30 tablet    Refill:  0     Discussed warning signs or symptoms. Please see discharge instructions. Patient expresses understanding.   The above documentation has been reviewed and is accurate and complete [name], M.D.

## 2023-08-24 ENCOUNTER — Encounter: Payer: 59 | Admitting: Rehabilitative and Restorative Service Providers"

## 2023-08-24 LAB — HM MAMMOGRAPHY: HM Mammogram: NORMAL

## 2023-08-29 ENCOUNTER — Ambulatory Visit (INDEPENDENT_AMBULATORY_CARE_PROVIDER_SITE_OTHER)
Admission: RE | Admit: 2023-08-29 | Discharge: 2023-08-29 | Disposition: A | Discharge status: Home / Self Care | Payer: 59 | Source: Ambulatory Visit

## 2023-08-29 DIAGNOSIS — Z780 Asymptomatic menopausal state: Secondary | ICD-10-CM

## 2023-09-03 ENCOUNTER — Other Ambulatory Visit: Payer: Self-pay | Admitting: Internal Medicine

## 2023-09-04 ENCOUNTER — Other Ambulatory Visit: Payer: Self-pay | Admitting: Internal Medicine

## 2023-09-04 DIAGNOSIS — H8113 Benign paroxysmal vertigo, bilateral: Secondary | ICD-10-CM

## 2023-09-05 NOTE — Progress Notes (Signed)
1 area of bone density is quite bad showing osteoporosis.  You are scheduled to see me on January 23.  We should talk about the bone density test in full detail at that visit to discuss treatment plan and options for osteoporosis.

## 2023-09-05 NOTE — Progress Notes (Signed)
Left elbow x-ray looks okay to radiology.

## 2023-09-12 ENCOUNTER — Ambulatory Visit: Payer: 59 | Admitting: Dermatology

## 2023-09-18 ENCOUNTER — Other Ambulatory Visit: Payer: Self-pay | Admitting: Family Medicine

## 2023-09-20 ENCOUNTER — Encounter: Payer: Self-pay | Admitting: Dermatology

## 2023-09-20 ENCOUNTER — Ambulatory Visit: Payer: 59 | Admitting: Dermatology

## 2023-09-20 DIAGNOSIS — Z85828 Personal history of other malignant neoplasm of skin: Secondary | ICD-10-CM

## 2023-09-20 DIAGNOSIS — L814 Other melanin hyperpigmentation: Secondary | ICD-10-CM

## 2023-09-20 DIAGNOSIS — L821 Other seborrheic keratosis: Secondary | ICD-10-CM | POA: Diagnosis not present

## 2023-09-20 NOTE — Progress Notes (Signed)
   New Patient Visit   Subjective  Sydney [name] is a 58 y.o. female who presents for the following: growth on back The patient has spots, moles and lesions to be evaluated, some may be new or changing and the patient may have concern these could be cancer. Hx of SCC on Cheek back in 2023, removed with Mohs surgery.  The following portions of the chart were reviewed this encounter and updated as appropriate: medications, allergies, medical history  Review of Systems:  No other skin or systemic complaints except as noted in HPI or Assessment and Plan.  Objective  Well appearing patient in no apparent distress; mood and affect are within normal limits.  A focused examination was performed of the following areas: Back Face Neck  Relevant exam findings are noted in the Assessment and Plan.    Assessment & Plan   SEBORRHEIC KERATOSIS - Stuck-on, waxy, tan-brown papules and/or plaques  - Benign-appearing - Discussed benign etiology and prognosis. - Observe - Call for any changes  LENTIGINES Exam: scattered tan macules Due to sun exposure Treatment Plan: Benign-appearing, observe. Recommend daily broad spectrum sunscreen SPF 30+ to sun-exposed areas, reapply every 2 hours as needed.  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  - Call if any new or changing lesions are noted between office visits   Return in about 3 months (around 12/19/2023) for TBSE.  I, Darice Smock, CMA, am acting as scribe for RUFUS CHRISTELLA HOLY, MD.   Documentation: I have reviewed the above documentation for accuracy and completeness, and I agree with the above.  RUFUS CHRISTELLA HOLY, MD

## 2023-09-20 NOTE — Patient Instructions (Signed)

## 2023-09-21 ENCOUNTER — Other Ambulatory Visit: Payer: Self-pay | Admitting: Internal Medicine

## 2023-09-21 DIAGNOSIS — E538 Deficiency of other specified B group vitamins: Secondary | ICD-10-CM

## 2023-09-29 ENCOUNTER — Other Ambulatory Visit: Payer: Self-pay | Admitting: Family Medicine

## 2023-09-29 ENCOUNTER — Ambulatory Visit: Payer: 59 | Admitting: Family Medicine

## 2023-09-29 VITALS — BP 110/80 | HR 63 | Ht 71.0 in | Wt 209.0 lb

## 2023-09-29 DIAGNOSIS — M810 Age-related osteoporosis without current pathological fracture: Secondary | ICD-10-CM

## 2023-09-29 DIAGNOSIS — M25522 Pain in left elbow: Secondary | ICD-10-CM

## 2023-09-29 DIAGNOSIS — Z9884 Bariatric surgery status: Secondary | ICD-10-CM

## 2023-09-29 DIAGNOSIS — E213 Hyperparathyroidism, unspecified: Secondary | ICD-10-CM

## 2023-09-29 NOTE — Patient Instructions (Addendum)
Thank you for coming in today.   Research Reclast infusion for osteoporosis.   5000 units of Vit d OTC daily.   Follow up with Endocrinology.   Please get labs today before you leave   Keep working on those elbow exercises and consider a wrist brace with heavy duty activity as needed.   I can reorder hand therapy if needed or do a cortisone injection if you want or need me to.

## 2023-09-29 NOTE — Progress Notes (Signed)
   Rubin Payor, PhD, LAT, ATC acting as a scribe for [name], MD.  Sydney [name] is a 58 y.o. female who presents to [facility] at [facility] today for f/u L elbow pain and DEXA scan review. Pt was last seen by Dr. Denyse Amass on 08/18/23 and was referred to hand therapy and a DEXA scan was ordered. Pt never scheduled any OT visits.   Today, pt reports her work schedule didn't align w/ OT. She got a brace for her elbow that she has been wearing at night and finds it helpful.   Dx testing: 08/29/23 DEXA scan  08/18/23 L elbow XR  Pertinent review of systems: No fevers or chills  Relevant historical information: Hyperparathyroidism.   Exam:  BP 110/80   Pulse 63   Ht 5\' 11"  (1.803 m)   Wt 209 lb (94.8 kg)   SpO2 97%   BMI 29.15 kg/m  General: Well Developed, well nourished, and in no acute distress.   MSK: Left elbow normal-appearing mildly tender palpation about epicondyle normal elbow motion.    Lab and Radiology Results Lab Results  Component Value Date   PTH 103 (H) 07/05/2023   CALCIUM 9.1 07/05/2023   CALCIUM 9.0 07/05/2023   PHOS 4.2 12/12/2007       Assessment and Plan: 58 y.o. female with left elbow pain improving with home exercise program and time.  Elbow bracing has been helpful.  Additionally patient has osteoporosis based on bone density test. Additionally she has a history of a fragility fracture.  I do think treatment for osteoporosis will be helpful however she does have coexisting hypoparathyroidism.  Plan to recheck PTH calcium and vitamin D.  Assuming labs are again abnormal would recommend recheck back with her endocrinologist to consider options.  I think bisphosphonates are probably going to be a good option probably Reclast.  Would like to avoid oral bisphosphonates given her history of gastric sleeve bariatric surgery.  PDMP not reviewed this encounter. Orders Placed This Encounter  Procedures   PTH, Intact and Calcium     Standing Status:   Future    Number of Occurrences:   1    Expiration Date:   09/28/2024   VITAMIN D 25 Hydroxy (Vit-D Deficiency, Fractures)    Standing Status:   Future    Number of Occurrences:   1    Expiration Date:   09/28/2024   No orders of the defined types were placed in this encounter.    Discussed warning signs or symptoms. Please see discharge instructions. Patient expresses understanding.   The above documentation has been reviewed and is accurate and complete [name], M.D.

## 2023-09-30 ENCOUNTER — Encounter: Payer: Self-pay | Admitting: Family Medicine

## 2023-09-30 LAB — PTH, INTACT AND CALCIUM
Calcium: 8.7 mg/dL (ref 8.6–10.4)
PTH: 82 pg/mL — ABNORMAL HIGH (ref 16–77)
PTH: 114 pg/mL — ABNORMAL HIGH (ref 16–77)

## 2023-09-30 LAB — VITAMIN D 25 HYDROXY (VIT D DEFICIENCY, FRACTURES): Vit D, 25-Hydroxy: 36 ng/mL (ref 30–100)

## 2023-09-30 LAB — HOUSE ACCOUNT TRACKING

## 2023-09-30 NOTE — Progress Notes (Signed)
Parathyroid hormone is elevated again.  Calcium is a bit lower than it was 2 months ago but is still okay.  Vitamin D level is adequate at 36.  I will contact your endocrinologist and we will come up with the next step.

## 2023-10-03 ENCOUNTER — Telehealth: Payer: Self-pay | Admitting: Family Medicine

## 2023-10-03 NOTE — Progress Notes (Signed)
I spoke with your endocrinologist.  She would like [place] to check 24-hour urine calcium and switch from calcium carbonate or Tums type calcium to calcium citrate (available over-the-counter).  We are going to check a 24-hour urine calcium per her request.  Once we get done with that we can use Reclast or Prolia.

## 2023-10-03 NOTE — Addendum Note (Signed)
Addended by: Rodolph Bong on: 10/03/2023 07:16 AM   Modules accepted: Orders

## 2023-10-03 NOTE — Telephone Encounter (Signed)
I spoke with Sydney [name]. She understands the 24-hour urine collection for 24-hour calcium.  Her husband will swing by and pick up the necessary supplies for that.  I communicated with Tammy in the lab who says that we are all good to go for that.

## 2023-10-09 ENCOUNTER — Other Ambulatory Visit: Payer: Self-pay | Admitting: Internal Medicine

## 2023-10-09 DIAGNOSIS — E66811 Obesity, class 1: Secondary | ICD-10-CM

## 2023-10-10 ENCOUNTER — Encounter: Payer: Self-pay | Admitting: Student

## 2023-10-11 ENCOUNTER — Other Ambulatory Visit: Payer: Self-pay

## 2023-10-11 ENCOUNTER — Other Ambulatory Visit: Payer: 59

## 2023-10-11 DIAGNOSIS — E213 Hyperparathyroidism, unspecified: Secondary | ICD-10-CM

## 2023-10-12 ENCOUNTER — Encounter: Payer: Self-pay | Admitting: Family Medicine

## 2023-10-12 LAB — EXTRA SPECIMEN

## 2023-10-12 LAB — CALCIUM, URINE, 24 HOUR: Calcium, 24H Urine: 26 mg/(24.h) — ABNORMAL LOW

## 2023-10-12 NOTE — Progress Notes (Signed)
Urine calcium level is low which I think means that you are calcium deficient.  Please increase the calcium like Dr. Elvera Lennox requested switching to calcium citrate.  I will send the result to her to double check as well.

## 2023-10-14 ENCOUNTER — Encounter: Payer: Self-pay | Admitting: Student

## 2023-10-14 ENCOUNTER — Other Ambulatory Visit: Payer: 59

## 2023-10-25 ENCOUNTER — Other Ambulatory Visit: Payer: Self-pay | Admitting: Family Medicine

## 2023-10-25 NOTE — Telephone Encounter (Signed)
 Rx was last filled on 09/19/23, which would mean she is taking this medication almost daily, instead of prn. Long term use is not advisable. Rx refill not appropriate.

## 2023-11-02 ENCOUNTER — Ambulatory Visit
Admission: RE | Admit: 2023-11-02 | Discharge: 2023-11-02 | Disposition: A | Discharge status: Home / Self Care | Payer: 59 | Source: Ambulatory Visit | Attending: Student | Admitting: Student

## 2023-11-02 DIAGNOSIS — R911 Solitary pulmonary nodule: Secondary | ICD-10-CM

## 2023-11-02 NOTE — Progress Notes (Signed)
 Follow up on pulmonary nodule

## 2023-11-06 ENCOUNTER — Other Ambulatory Visit: Payer: Self-pay | Admitting: Internal Medicine

## 2023-11-06 DIAGNOSIS — E66811 Obesity, class 1: Secondary | ICD-10-CM

## 2023-11-12 ENCOUNTER — Other Ambulatory Visit: Payer: Self-pay | Admitting: Internal Medicine

## 2023-11-12 DIAGNOSIS — E60 Dietary zinc deficiency: Secondary | ICD-10-CM

## 2023-12-12 ENCOUNTER — Other Ambulatory Visit: Payer: Self-pay | Admitting: Internal Medicine

## 2023-12-12 DIAGNOSIS — E66811 Obesity, class 1: Secondary | ICD-10-CM

## 2023-12-12 NOTE — Telephone Encounter (Signed)
 They are overdue for an appt.

## 2023-12-20 ENCOUNTER — Ambulatory Visit: Payer: 59 | Admitting: Dermatology

## 2023-12-28 ENCOUNTER — Ambulatory Visit: Admitting: Dermatology

## 2024-01-05 ENCOUNTER — Ambulatory Visit: Admitting: Internal Medicine

## 2024-01-05 ENCOUNTER — Encounter: Payer: Self-pay | Admitting: Internal Medicine

## 2024-01-05 VITALS — BP 122/78 | HR 74 | Temp 97.9°F | Resp 16 | Ht 71.0 in | Wt 212.6 lb

## 2024-01-05 DIAGNOSIS — D75839 Thrombocytosis, unspecified: Secondary | ICD-10-CM

## 2024-01-05 DIAGNOSIS — E538 Deficiency of other specified B group vitamins: Secondary | ICD-10-CM

## 2024-01-05 DIAGNOSIS — D508 Other iron deficiency anemias: Secondary | ICD-10-CM

## 2024-01-05 DIAGNOSIS — E60 Dietary zinc deficiency: Secondary | ICD-10-CM | POA: Diagnosis not present

## 2024-01-05 DIAGNOSIS — E519 Thiamine deficiency, unspecified: Secondary | ICD-10-CM

## 2024-01-05 DIAGNOSIS — D510 Vitamin B12 deficiency anemia due to intrinsic factor deficiency: Secondary | ICD-10-CM

## 2024-01-05 DIAGNOSIS — K5904 Chronic idiopathic constipation: Secondary | ICD-10-CM

## 2024-01-05 DIAGNOSIS — E6609 Other obesity due to excess calories: Secondary | ICD-10-CM

## 2024-01-05 DIAGNOSIS — E66811 Obesity, class 1: Secondary | ICD-10-CM

## 2024-01-05 DIAGNOSIS — Z6830 Body mass index (BMI) 30.0-30.9, adult: Secondary | ICD-10-CM

## 2024-01-05 LAB — CBC WITH DIFFERENTIAL/PLATELET
Basophils Absolute: 0.1 10*3/uL (ref 0.0–0.1)
Basophils Relative: 1.3 % (ref 0.0–3.0)
Eosinophils Absolute: 0.5 10*3/uL (ref 0.0–0.7)
Eosinophils Relative: 5 % (ref 0.0–5.0)
HCT: 40.4 % (ref 36.0–46.0)
Hemoglobin: 13.1 g/dL (ref 12.0–15.0)
Lymphocytes Relative: 40 % (ref 12.0–46.0)
Lymphs Abs: 4.1 10*3/uL — ABNORMAL HIGH (ref 0.7–4.0)
MCHC: 32.5 g/dL (ref 30.0–36.0)
MCV: 91.5 fl (ref 78.0–100.0)
Monocytes Absolute: 1.2 10*3/uL — ABNORMAL HIGH (ref 0.1–1.0)
Monocytes Relative: 11.2 % (ref 3.0–12.0)
Neutro Abs: 4.4 10*3/uL (ref 1.4–7.7)
Neutrophils Relative %: 42.5 % — ABNORMAL LOW (ref 43.0–77.0)
Platelets: 420 10*3/uL — ABNORMAL HIGH (ref 150.0–400.0)
RBC: 4.42 Mil/uL (ref 3.87–5.11)
RDW: 13.8 % (ref 11.5–15.5)
WBC: 10.3 10*3/uL (ref 4.0–10.5)

## 2024-01-05 LAB — IBC + FERRITIN
Ferritin: 12.6 ng/mL (ref 10.0–291.0)
Iron: 56 ug/dL (ref 42–145)
Saturation Ratios: 12.4 % — ABNORMAL LOW (ref 20.0–50.0)
TIBC: 450.8 ug/dL — ABNORMAL HIGH (ref 250.0–450.0)
Transferrin: 322 mg/dL (ref 212.0–360.0)

## 2024-01-05 LAB — MAGNESIUM: Magnesium: 2.1 mg/dL (ref 1.5–2.5)

## 2024-01-05 LAB — TSH: TSH: 0.83 u[IU]/mL (ref 0.35–5.50)

## 2024-01-05 MED ORDER — ZINC GLUCONATE 50 MG PO TABS: 50.0000 mg | ORAL_TABLET | Freq: Every day | ORAL | 1 refills | Status: DC

## 2024-01-05 MED ORDER — CYANOCOBALAMIN 1000 MCG/ML IJ SOLN: 1000.0000 ug | INTRAMUSCULAR | 5 refills | Status: AC

## 2024-01-05 MED ORDER — FOLIC ACID 1 MG PO TABS: 1.0000 mg | ORAL_TABLET | Freq: Every day | ORAL | 1 refills | Status: DC

## 2024-01-05 MED ORDER — TIRZEPATIDE-WEIGHT MANAGEMENT 2.5 MG/0.5ML ~~LOC~~ SOLN: 2.5000 mg | SUBCUTANEOUS | 0 refills | Status: DC

## 2024-01-05 NOTE — Progress Notes (Signed)
 Subjective:  Patient ID: Sydney [name], female    DOB: 1966/05/02  Age: 58 y.o. MRN: [id number]  CC: Anemia   HPI Sydney [name] presents for f/up ----   Discussed the use of AI scribe software for clinical note transcription with the patient, who gave verbal consent to proceed.  History of Present Illness   Sydney [name] is a 58 year old female with osteoporosis who presents for follow-up on her bone health and weight management.  She has a history of osteoporosis, diagnosed after a fall in early April that resulted in a wrist fracture. A bone density study confirmed osteoporosis in her forearms. She takes calcium tablets twice daily, though she cannot recall the dosage, and vitamin D  supplementation. The calcium causes constipation, which she manages with Miralax in her coffee.  Regarding weight management, she previously tried Wegovy  and Zepbound  with good results but discontinued them due to cost as her insurance does not cover these medications. She was then placed on phentermine , which helped maintain her weight loss, but she has not taken it for the past month and a half, leading to weight gain. She experienced occasional rapid heartbeats shortly after taking phentermine , but these episodes were brief.  No symptoms of anemia, such as chest pain, shortness of breath, bleeding, bruising, night sweats, unexplained weight loss, or abdominal pain. She receives B12 injections and takes zinc  and folic acid . She does not take any iron  supplements beyond what is in her multivitamin.  Her last mammogram was in September of the previous year, and she recently received a Cologuard test kit but has not yet completed it. She also had a Pap smear in September, which was normal.       Outpatient Medications Prior to Visit  Medication Sig Dispense Refill   Cholecalciferol  ([employer] D3) 50 MCG (2000 UT) CAPS Take 1 capsule (2,000 Units total) by mouth daily. 100 capsule 1   meclizine  (ANTIVERT )  25 MG tablet TAKE 1 TABLET TWICE A DAY AS NEEDED FOR DIZZINESS 60 tablet 3   Multiple Vitamin (MULTIVITAMIN) tablet Take 1 tablet by mouth 2 (two) times daily.     phentermine  30 MG capsule TAKE 1 CAPSULE BY MOUTH EVERY DAY IN THE MORNING 30 capsule 0   cyanocobalamin  (VITAMIN B12) 1000 MCG/ML injection INJECT 1 ML (1,000 MCG TOTAL) INTO THE MUSCLE ONCE FOR 1 DOSE. 1 mL 5   folic acid  (FOLVITE ) 1 MG tablet TAKE 1 TABLET EVERY DAY 30 tablet 2   zinc  gluconate (CVS ZINC  GLUCONATE) 50 MG tablet TAKE 1 TABLET DAILY 90 tablet 0   b complex vitamins capsule Take 1 capsule by mouth daily.     iron  polysaccharides (NIFEREX) 150 MG capsule Take 1 capsule (150 mg total) by mouth daily.     meloxicam  (MOBIC ) 15 MG tablet TAKE 1 TABLET (15 MG TOTAL) BY MOUTH AS NEEDED FOR PAIN. WRIST AND ELBOW 30 tablet 0   No facility-administered medications prior to visit.    ROS Review of Systems  Constitutional:  Positive for unexpected weight change (wt gain). Negative for appetite change, chills, diaphoresis and fatigue.  HENT: Negative.    Eyes: Negative.   Respiratory: Negative.  Negative for cough, chest tightness, shortness of breath and wheezing.   Cardiovascular:  Negative for chest pain, palpitations and leg swelling.  Gastrointestinal:  Positive for constipation. Negative for abdominal pain, diarrhea, nausea and vomiting.  Genitourinary: Negative.  Negative for difficulty urinating.  Musculoskeletal: Negative.  Negative for arthralgias and  myalgias.  Skin: Negative.   Neurological:  Negative for dizziness and weakness.  Hematological:  Negative for adenopathy. Does not bruise/bleed easily.  Psychiatric/Behavioral: Negative.      Objective:  BP 122/78 (BP Location: Left Arm, Patient Position: Sitting, Cuff Size: Normal)   Pulse 74   Temp 97.9 F (36.6 C) (Oral)   Resp 16   Ht 5\' 11"  (1.803 m)   Wt 212 lb 9.6 oz (96.4 kg)   SpO2 96%   BMI 29.65 kg/m   BP Readings from Last 3 Encounters:   01/05/24 122/78  09/29/23 110/80  08/18/23 124/72    Wt Readings from Last 3 Encounters:  01/05/24 212 lb 9.6 oz (96.4 kg)  09/29/23 209 lb (94.8 kg)  08/18/23 211 lb (95.7 kg)    Physical Exam Vitals reviewed.  Constitutional:      Appearance: Normal appearance.  HENT:     Mouth/Throat:     Mouth: Mucous membranes are moist.  Eyes:     General: No scleral icterus.    Conjunctiva/sclera: Conjunctivae normal.  Cardiovascular:     Rate and Rhythm: Normal rate and regular rhythm.     Heart sounds: No murmur heard.    No friction rub. No gallop.  Pulmonary:     Effort: Pulmonary effort is normal.     Breath sounds: No stridor. No wheezing, rhonchi or rales.  Abdominal:     General: Abdomen is flat.     Palpations: There is no mass.     Tenderness: There is no abdominal tenderness. There is no guarding.     Hernia: No hernia is present.  Musculoskeletal:        General: Normal range of motion.     Cervical back: Neck supple.     Right lower leg: No edema.     Left lower leg: No edema.  Lymphadenopathy:     Cervical: No cervical adenopathy.  Skin:    General: Skin is warm and dry.     Coloration: Skin is not jaundiced or pale.     Findings: No erythema or rash.  Neurological:     General: No focal deficit present.     Mental Status: She is alert.  Psychiatric:        Mood and Affect: Mood normal.        Behavior: Behavior normal.     Lab Results  Component Value Date   WBC 10.3 01/05/2024   HGB 13.1 01/05/2024   HCT 40.4 01/05/2024   PLT 420.0 (H) 01/05/2024   GLUCOSE 105 (H) 07/05/2023   CHOL 164 07/05/2023   TRIG 68.0 07/05/2023   HDL 46.80 07/05/2023   LDLCALC 104 (H) 07/05/2023   ALT 24 07/05/2023   AST 21 07/05/2023   NA 141 07/05/2023   K 4.4 07/05/2023   CL 108 07/05/2023   CREATININE 0.82 07/05/2023   BUN 18 07/05/2023   CO2 29 07/05/2023   TSH 0.83 01/05/2024   INR 1.20 03/07/2016   HGBA1C 5.1 01/15/2021    CT Chest Wo Contrast Result  Date: 11/03/2023 CLINICAL DATA:  Follow-up pulmonary nodule EXAM: CT CHEST WITHOUT CONTRAST TECHNIQUE: Multidetector CT imaging of the chest was performed following the standard protocol without IV contrast. RADIATION DOSE REDUCTION: This exam was performed according to the departmental dose-optimization program which includes automated exposure control, adjustment of the mA and/or kV according to patient size and/or use of iterative reconstruction technique. COMPARISON:  CT chest October 15, 2022 FINDINGS: Cardiovascular: No significant  vascular findings. Normal heart size. No pericardial effusion. No significant coronary artery calcifications Mediastinum/Nodes: No enlarged mediastinal or axillary lymph nodes. Thyroid  gland, trachea, and esophagus demonstrate no significant findings. Lungs/Pleura: Lungs are clear. No pleural effusion or pneumothorax. No change in the semi-solid 7 mm right lower lobe nodule image 101. No other pulmonary nodules. Upper Abdomen: Postsurgical changes of the stomach, may be related to bariatric surgical procedure. Prior cholecystectomy. Musculoskeletal: No chest wall mass or suspicious bone lesions identified. IMPRESSION: *No change in the semi-solid 7 mm right lower lobe nodule. No other pulmonary nodules. Lung-RADS 2, benign appearance or behavior. Continue annual screening with low-dose chest CT without contrast in 12 months. *No acute cardiopulmonary process. *Postsurgical changes of the stomach, may be related to bariatric surgical procedure. Prior cholecystectomy. Electronically Signed   By: Fredrich Jefferson M.D.   On: 11/03/2023 13:48    Assessment & Plan:  Thiamine  deficiency -     CBC with Differential/Platelet; Future  Chronic zinc  deficiency -     CBC with Differential/Platelet; Future -     Zinc  Gluconate; Take 1 tablet (50 mg total) by mouth daily.  Dispense: 90 tablet; Refill: 1  Vitamin B12 deficiency anemia due to intrinsic factor deficiency -     CBC with  Differential/Platelet; Future -     Cyanocobalamin ; Inject 1 mL (1,000 mcg total) into the muscle every 30 (thirty) days.  Dispense: 1 mL; Refill: 5  Folate deficiency -     CBC with Differential/Platelet; Future -     Folic Acid ; Take 1 tablet (1 mg total) by mouth daily.  Dispense: 90 tablet; Refill: 1  Other iron  deficiency anemia -     IBC + Ferritin; Future -     CBC with Differential/Platelet; Future  Chronic idiopathic constipation -     Magnesium; Future -     TSH; Future  Class 1 obesity due to excess calories with serious comorbidity and body mass index (BMI) of 30.0 to 30.9 in adult -     Tirzepatide -Weight Management; Inject 2.5 mg into the skin once a week.  Dispense: 2 mL; Refill: 0  Thrombocytosis- PLTs are stable. There are no signs of lymphoproliferative disease.     Follow-up: Return in about 6 months (around 07/07/2024).  Sandra Crouch, MD

## 2024-01-05 NOTE — Patient Instructions (Signed)
Essential Thrombocythemia  Essential thrombocythemia is a condition in which a person has too many platelets (thrombocytes) in the blood. Platelets are parts of blood that stick together and form a clot (thrombus) to help the body stop bleeding after an injury. This condition may also be called primary or essential thrombocytosis. Essential thrombocythemia happens when abnormal cells in the bone marrow (megakaryocytes) make too many platelets. What are the causes? The cause of this condition is not known. What are the signs or symptoms? This condition may not cause any symptoms. If you have symptoms, they may include: Blood clots. Weakness. Headache. Itching. Sweating or fever. Dizziness or confusion. Tingling or burning in your hands or feet. Symptoms may also include bleeding and an enlarged spleen. How is this diagnosed? This condition may be diagnosed based on: A physical exam. Your symptoms. Your medical history. Blood tests. A procedure to collect a sample of your bone marrow (bone marrow aspiration) for testing. How is this treated? If you do not have symptoms, you may not need treatment. Your health care provider may monitor your condition with regular blood tests. If you have symptoms, or if your platelet count is very high, you may be treated with: Aspirin or other medicines to thin your blood and help prevent blood clots. Medicines to reduce the number of platelets in your blood. A procedure to remove some platelets from your blood (plateletpheresis). During this procedure: Your health care provider will place an IV into one of your veins. The IV will be used to draw your blood into a machine that separates out the extra platelets. The blood with reduced platelets will be returned to your body. Follow these instructions at home: Medicines Take over-the-counter and prescription medicines only as told by your health care provider. If you are taking blood thinners: Talk  with your health care provider before you take any medicines that contain aspirin or NSAIDs, such as ibuprofen. These medicines increase your risk for dangerous bleeding. Take your medicine exactly as told, at the same time every day. Avoid activities that could cause injury or bruising, and follow instructions about how to prevent falls. Wear a medical alert bracelet or carry a card that lists what medicines you take. Tell all health care providers, including dental health care providers, about any medicines you are taking to prevent blood clots. General instructions Do not use any products that contain nicotine or tobacco. These products include cigarettes, chewing tobacco, and vaping devices, such as e-cigarettes. If you need help quitting, ask your health care provider. Ask your health care provider about managing or preventing high cholesterol, high blood pressure, and diabetes. These conditions can make essential thrombocythemia worse. Keep all follow-up visits. This is important. Contact a health care provider if: You have severe pain, and medicines do not help. You have problems taking your medicines to prevent blood clots. You have new pain, warmth, swelling, or redness in an arm or leg. This could mean you have a blood clot. You faint. You have unusual bruises. You have bloody or tarry stools. You have pink or bloody urine. Your menstrual periods are heavier than normal, if applicable. You have nosebleeds, bleeding gums, or other bleeding. Get help right away if:  You have chest pain. You have trouble breathing. You have any symptoms of a stroke. "BE FAST" is an easy way to remember the main warning signs of a stroke: B - Balance. Signs are dizziness, sudden trouble walking, or loss of balance. E - Eyes. Signs are  trouble seeing or a sudden change in vision. F - Face. Signs are sudden weakness or numbness of the face, or the face or eyelid drooping on one side. A - Arm. Signs are  weakness or numbness in an arm. This happens suddenly and usually on one side of the body. S - Speech. Signs are sudden trouble speaking, slurred speech, or trouble understanding what people say. T - Time. Time to call emergency services. Write down what time symptoms started. You have other signs of a stroke, such as: A sudden, severe headache with no known cause. Nausea or vomiting. Seizure. These symptoms may represent a serious problem that is an emergency. Do not wait to see if the symptoms will go away. Get medical help right away. Call your local emergency services (911 in the [place] Do not drive yourself to the hospital. Summary Essential thrombocythemia happens when abnormal cells in the bone marrow make too many platelets. If you have symptoms, or if your platelet count is very high, you may need treatment. Treatment can vary and may include medicines to thin the blood and prevent blood clots. Ask your health care provider about how to manage or prevent high cholesterol, high blood pressure, and diabetes. All of these can make essential thrombocythemia worse. Get help right away if you have any symptoms of stroke. This information is not intended to replace advice given to you by your health care provider. Make sure you discuss any questions you have with your health care provider. Document Revised: 02/23/2021 Document Reviewed: 02/23/2021 Elsevier Patient Education  2024 [employer].

## 2024-01-06 ENCOUNTER — Telehealth: Payer: Self-pay

## 2024-01-06 ENCOUNTER — Other Ambulatory Visit: Payer: Self-pay | Admitting: Internal Medicine

## 2024-01-06 DIAGNOSIS — D75839 Thrombocytosis, unspecified: Secondary | ICD-10-CM | POA: Insufficient documentation

## 2024-01-06 DIAGNOSIS — M8080XS Other osteoporosis with current pathological fracture, unspecified site, sequela: Secondary | ICD-10-CM

## 2024-01-06 MED ORDER — DENOSUMAB 60 MG/ML ~~LOC~~ SOSY: 60.0000 mg | PREFILLED_SYRINGE | Freq: Once | SUBCUTANEOUS | Status: DC

## 2024-01-06 NOTE — Telephone Encounter (Signed)
 Prolia VOB initiated via [email or web address]  Next Prolia inj DUE: NEW START

## 2024-01-11 ENCOUNTER — Telehealth: Payer: Self-pay

## 2024-01-11 NOTE — Telephone Encounter (Signed)
 Medical Buy and [facility] - Prior Authorization REQUIRED for [employer]   PA PROCESS DETAILS: PA is required and is currently not on file. Please call Medical Review at [phone], option 3, to initiate PA. Or submit PA request online at [email or web address].   Eligible for [employer] copay enrollment

## 2024-01-12 NOTE — Telephone Encounter (Signed)
 Prolia discontinued.

## 2024-01-12 NOTE — Telephone Encounter (Signed)
 Prior Authorization initiated for [facility] via [employer] Case ID: [id number]  Pending review    I am submitting this request for prior authorization for Evenity ([id number]) for my patient,  Byrle, who has a complex medical history that necessitates this treatment as the most appropriate  option.  Vrinda has a significant history of severe osteoporosis, fragility fractures, and multiple comorbid  conditions, including hypothyroidism, vitamin D  deficiency, hyperparathyroidism, and a gastric sleeve  bariatric surgery. These conditions contribute to poor bone health and an increased risk of further  fractures. Most notably, the patient has experienced a marked decline in bone mineral density, with a Tscore worsening from -1.3 on 10/13/2017 to -4.0 on 08/29/2023, underscoring the severity of their  osteoporosis.  Given the patient's extensive history, conventional treatments such as oral or IV bisphosphonates and  Prolia (denosumab) are inadequate and inappropriate for managing their osteoporosis. Due to the history  of gastric sleeve bariatric surgery, oral bisphosphonates pose a substantial risk of malabsorption and  gastrointestinal intolerance, rendering them ineffective. Additionally, IV bisphosphonates may not  provide the required anabolic effect needed for bone rebuilding in the setting of severe osteoporosis and  prior fragility fractures. While Prolia works as an antiresorptive therapy, it does not offer the dual  anabolic and antiresorptive mechanism that Evenity provides, which is crucial for addressing the  patient's rapid bone density deterioration.  Evenity is uniquely suited for this patient due to its mechanism of action, providing both bone formation  stimulation and antiresorptive effects to significantly improve bone mineral density in high-risk patients.  Given the severity of osteoporosis in this case, along with the patient's history of fragility fractures  and  the contraindications for other osteoporosis therapies, Evenity represents the most appropriate and  medically necessary treatment to prevent further fractures and deterioration of bone health.  I urge you to approve this request based on the critical need for effective intervention in this case. Please  let us  know if any additional information is required to facilitate this authorization.  Thank you for your time and consideration.

## 2024-01-14 LAB — COLOGUARD
COLOGUARD: NEGATIVE
Cologuard: NEGATIVE

## 2024-01-14 LAB — EXTERNAL GENERIC LAB PROCEDURE: COLOGUARD: NEGATIVE

## 2024-01-16 ENCOUNTER — Encounter: Payer: Self-pay | Admitting: Internal Medicine

## 2024-01-16 NOTE — Telephone Encounter (Signed)
 Per Availity:

## 2024-01-16 NOTE — Telephone Encounter (Signed)
 This request was marked "VOID" in Availity by payer.   Will need to f/u on this.

## 2024-01-16 NOTE — Telephone Encounter (Signed)
 Prior Authorization initiated for EVENITY via [email or web address] KEY: [id number]

## 2024-01-22 ENCOUNTER — Other Ambulatory Visit: Payer: Self-pay | Admitting: Internal Medicine

## 2024-01-22 DIAGNOSIS — E559 Vitamin D deficiency, unspecified: Secondary | ICD-10-CM

## 2024-01-24 ENCOUNTER — Other Ambulatory Visit: Payer: Self-pay | Admitting: Internal Medicine

## 2024-01-24 ENCOUNTER — Encounter: Payer: Self-pay | Admitting: Internal Medicine

## 2024-01-24 DIAGNOSIS — E66811 Obesity, class 1: Secondary | ICD-10-CM

## 2024-01-28 NOTE — Telephone Encounter (Signed)
 Pharmacy Benefit  Prior Auth initiated via [email or web address] KEY:

## 2024-02-07 ENCOUNTER — Other Ambulatory Visit: Payer: Self-pay | Admitting: Internal Medicine

## 2024-02-07 DIAGNOSIS — M8080XS Other osteoporosis with current pathological fracture, unspecified site, sequela: Secondary | ICD-10-CM

## 2024-02-07 MED ORDER — DENOSUMAB 60 MG/ML ~~LOC~~ SOSY: 60.0000 mg | PREFILLED_SYRINGE | Freq: Once | SUBCUTANEOUS | Status: DC

## 2024-02-08 ENCOUNTER — Telehealth: Payer: Self-pay

## 2024-02-08 ENCOUNTER — Other Ambulatory Visit (HOSPITAL_COMMUNITY): Payer: Self-pay

## 2024-02-08 NOTE — Telephone Encounter (Signed)
 Sydney [name]

## 2024-02-08 NOTE — Telephone Encounter (Signed)
 PA for medical submitted via [employer]   Authorization Number : [id number]

## 2024-02-10 ENCOUNTER — Other Ambulatory Visit (HOSPITAL_COMMUNITY): Payer: Self-pay

## 2024-02-13 NOTE — Telephone Encounter (Signed)
 Per separate encounter, Prolia  was discontinued.

## 2024-02-13 NOTE — Telephone Encounter (Signed)
 Sydney [name]

## 2024-02-13 NOTE — Telephone Encounter (Addendum)
 Received PA form via fax.    Attached blank form to chart.

## 2024-02-15 NOTE — Telephone Encounter (Signed)
 Received denial for Evenity Via fax. Denial letter in media.

## 2024-02-17 ENCOUNTER — Other Ambulatory Visit: Payer: Self-pay | Admitting: Family

## 2024-02-17 DIAGNOSIS — E6609 Other obesity due to excess calories: Secondary | ICD-10-CM

## 2024-03-01 NOTE — Telephone Encounter (Signed)
 Medical [employer] and [employer] Selection for Medically Necessary Indications      As defined in [employer], health care services are not medically necessary when they are more costly than alternative services that are at least as likely to produce equivalent therapeutic or diagnostic results. Evenity is more costly to [place] than other alternative medications. There is a lack of reliable evidence that Evenity is superior to the lower cost alternative: Prolia .  Therefore, [facility] considers Evenity to be medically necessary only for members who have a contraindication, intolerance or ineffective response to the available equivalent alternative: Prolia .

## 2024-03-02 NOTE — Telephone Encounter (Signed)
 Unable to reach patient. LMTRC

## 2024-03-08 NOTE — Telephone Encounter (Signed)
 Am I asking her if she's ready to start Prolia  ?

## 2024-03-09 ENCOUNTER — Encounter: Payer: Self-pay | Admitting: Internal Medicine

## 2024-03-09 DIAGNOSIS — E66811 Body mass index (BMI) 30.0-30.9, adult: Secondary | ICD-10-CM

## 2024-03-14 MED ORDER — ZEPBOUND 2.5 MG/0.5ML ~~LOC~~ SOLN: 2.5000 mg | SUBCUTANEOUS | 0 refills | Status: DC

## 2024-04-09 ENCOUNTER — Other Ambulatory Visit: Payer: Self-pay | Admitting: Internal Medicine

## 2024-04-09 DIAGNOSIS — E66811 Obesity, class 1: Secondary | ICD-10-CM

## 2024-04-11 ENCOUNTER — Other Ambulatory Visit: Payer: Self-pay | Admitting: Internal Medicine

## 2024-04-11 DIAGNOSIS — E6609 Other obesity due to excess calories: Secondary | ICD-10-CM

## 2024-04-11 MED ORDER — TIRZEPATIDE-WEIGHT MANAGEMENT 5 MG/0.5ML ~~LOC~~ SOLN: 5.0000 mg | SUBCUTANEOUS | 0 refills | Status: AC

## 2024-07-04 ENCOUNTER — Other Ambulatory Visit: Payer: Self-pay | Admitting: Internal Medicine

## 2024-07-04 DIAGNOSIS — E538 Deficiency of other specified B group vitamins: Secondary | ICD-10-CM

## 2024-07-18 ENCOUNTER — Other Ambulatory Visit: Payer: Self-pay | Admitting: Internal Medicine

## 2024-07-18 DIAGNOSIS — E559 Vitamin D deficiency, unspecified: Secondary | ICD-10-CM

## 2024-07-30 ENCOUNTER — Encounter: Payer: Self-pay | Admitting: Hematology

## 2024-08-18 ENCOUNTER — Other Ambulatory Visit: Payer: Self-pay | Admitting: Internal Medicine

## 2024-08-18 DIAGNOSIS — E60 Dietary zinc deficiency: Secondary | ICD-10-CM

## 2024-09-02 ENCOUNTER — Encounter: Payer: Self-pay | Admitting: Internal Medicine

## 2024-09-02 ENCOUNTER — Encounter: Payer: Self-pay | Admitting: Hematology

## 2024-09-05 ENCOUNTER — Ambulatory Visit: Payer: Self-pay | Admitting: Internal Medicine

## 2024-09-05 ENCOUNTER — Ambulatory Visit: Admitting: Internal Medicine

## 2024-09-05 VITALS — BP 110/82 | HR 72 | Temp 97.9°F | Ht 71.0 in | Wt 194.6 lb

## 2024-09-05 DIAGNOSIS — I950 Idiopathic hypotension: Secondary | ICD-10-CM | POA: Diagnosis not present

## 2024-09-05 DIAGNOSIS — M8080XS Other osteoporosis with current pathological fracture, unspecified site, sequela: Secondary | ICD-10-CM

## 2024-09-05 DIAGNOSIS — Z23 Encounter for immunization: Secondary | ICD-10-CM | POA: Diagnosis not present

## 2024-09-05 DIAGNOSIS — D75839 Thrombocytosis, unspecified: Secondary | ICD-10-CM | POA: Diagnosis not present

## 2024-09-05 DIAGNOSIS — E60 Dietary zinc deficiency: Secondary | ICD-10-CM

## 2024-09-05 DIAGNOSIS — E213 Hyperparathyroidism, unspecified: Secondary | ICD-10-CM

## 2024-09-05 DIAGNOSIS — E519 Thiamine deficiency, unspecified: Secondary | ICD-10-CM

## 2024-09-05 DIAGNOSIS — Z0000 Encounter for general adult medical examination without abnormal findings: Secondary | ICD-10-CM

## 2024-09-05 DIAGNOSIS — E538 Deficiency of other specified B group vitamins: Secondary | ICD-10-CM

## 2024-09-05 DIAGNOSIS — Z0001 Encounter for general adult medical examination with abnormal findings: Secondary | ICD-10-CM

## 2024-09-05 DIAGNOSIS — D510 Vitamin B12 deficiency anemia due to intrinsic factor deficiency: Secondary | ICD-10-CM

## 2024-09-05 LAB — BASIC METABOLIC PANEL WITH GFR
BUN: 16 mg/dL (ref 6–23)
CO2: 28 meq/L (ref 19–32)
Calcium: 8.7 mg/dL (ref 8.4–10.5)
Chloride: 107 meq/L (ref 96–112)
Creatinine, Ser: 0.7 mg/dL (ref 0.40–1.20)
GFR: 95.92 mL/min
Glucose, Bld: 75 mg/dL (ref 70–99)
Potassium: 4.1 meq/L (ref 3.5–5.1)
Sodium: 141 meq/L (ref 135–145)

## 2024-09-05 LAB — LIPID PANEL
Cholesterol: 137 mg/dL (ref 28–200)
HDL: 49.3 mg/dL
LDL Cholesterol: 76 mg/dL (ref 10–99)
NonHDL: 87.6
Total CHOL/HDL Ratio: 3
Triglycerides: 56 mg/dL (ref 10.0–149.0)
VLDL: 11.2 mg/dL (ref 0.0–40.0)

## 2024-09-05 LAB — TSH: TSH: 0.6 u[IU]/mL (ref 0.35–5.50)

## 2024-09-05 LAB — CBC WITH DIFFERENTIAL/PLATELET
Basophils Absolute: 0.1 K/uL (ref 0.0–0.1)
Basophils Relative: 1.3 % (ref 0.0–3.0)
Eosinophils Absolute: 0.2 K/uL (ref 0.0–0.7)
Eosinophils Relative: 1.7 % (ref 0.0–5.0)
HCT: 37.7 % (ref 36.0–46.0)
Hemoglobin: 12.6 g/dL (ref 12.0–15.0)
Lymphocytes Relative: 34.4 % (ref 12.0–46.0)
Lymphs Abs: 3.1 K/uL (ref 0.7–4.0)
MCHC: 33.3 g/dL (ref 30.0–36.0)
MCV: 89.6 fl (ref 78.0–100.0)
Monocytes Absolute: 1.1 K/uL — ABNORMAL HIGH (ref 0.1–1.0)
Monocytes Relative: 12.5 % — ABNORMAL HIGH (ref 3.0–12.0)
Neutro Abs: 4.5 K/uL (ref 1.4–7.7)
Neutrophils Relative %: 50.1 % (ref 43.0–77.0)
Platelets: 383 K/uL (ref 150.0–400.0)
RBC: 4.21 Mil/uL (ref 3.87–5.11)
RDW: 14.1 % (ref 11.5–15.5)
WBC: 9.1 K/uL (ref 4.0–10.5)

## 2024-09-05 LAB — HEPATIC FUNCTION PANEL
ALT: 17 U/L (ref 3–35)
AST: 18 U/L (ref 5–37)
Albumin: 3.8 g/dL (ref 3.5–5.2)
Alkaline Phosphatase: 119 U/L — ABNORMAL HIGH (ref 39–117)
Bilirubin, Direct: 0.1 mg/dL (ref 0.1–0.3)
Total Bilirubin: 0.4 mg/dL (ref 0.2–1.2)
Total Protein: 6.9 g/dL (ref 6.0–8.3)

## 2024-09-05 LAB — IBC + FERRITIN
Ferritin: 17.7 ng/mL (ref 10.0–291.0)
Iron: 89 ug/dL (ref 42–145)
Saturation Ratios: 22.5 % (ref 20.0–50.0)
TIBC: 394.8 ug/dL (ref 250.0–450.0)
Transferrin: 282 mg/dL (ref 212.0–360.0)

## 2024-09-05 LAB — CORTISOL: Cortisol, Plasma: 6.9 ug/dL

## 2024-09-05 LAB — VITAMIN D 25 HYDROXY (VIT D DEFICIENCY, FRACTURES): VITD: 33.32 ng/mL (ref 30.00–100.00)

## 2024-09-05 LAB — PHOSPHORUS: Phosphorus: 4 mg/dL (ref 2.3–4.6)

## 2024-09-05 MED ORDER — ZINC GLUCONATE 50 MG PO TABS: 50.0000 mg | ORAL_TABLET | Freq: Every day | ORAL | 1 refills | Status: AC

## 2024-09-05 MED ORDER — DENOSUMAB 60 MG/ML ~~LOC~~ SOSY: 60.0000 mg | PREFILLED_SYRINGE | Freq: Once | SUBCUTANEOUS | Status: AC

## 2024-09-05 NOTE — Progress Notes (Signed)
 "  Subjective:  Patient ID: Sydney [name], female    DOB: 10-21-1965  Age: 58 y.o. MRN: [id number]  CC: Annual Exam   HPI Sydney [name] presents for a CPX and f/up ---  Discussed the use of AI scribe software for clinical note transcription with the patient, who gave verbal consent to proceed.  History of Present Illness Sydney [name] is a 58 year old female who presents with low blood pressure and dizziness upon standing.  Her blood pressure, typically around 120/75 mmHg, has been lower recently, approximately 110 mmHg. She experiences dizziness when standing up quickly, a symptom persisting for a couple of months. She manages this by pausing and waiting for the dizziness to subside.  She is actively maintaining physical activity and has lost some weight, which she reports is progressing well without any adverse effects from her medications.  Her current medications include a multivitamin, vitamin D , zinc , and B12. She receives B12 injections monthly and is due for her next injection today.  No chest pain, shortness of breath, abdominal pain, nausea, vomiting, diarrhea, constipation, numbness, weakness, or tingling.     Outpatient Medications Prior to Visit  Medication Sig Dispense Refill   Cholecalciferol  (VITAMIN D3) 50 MCG (2000 UT) capsule TAKE 1 CAPSULE BY MOUTH EVERY DAY 90 capsule 3   cyanocobalamin  (VITAMIN B12) 1000 MCG/ML injection Inject 1 mL (1,000 mcg total) into the muscle every 30 (thirty) days. 1 mL 5   folic acid  (FOLVITE ) 1 MG tablet TAKE 1 TABLET BY MOUTH EVERY DAY 90 tablet 1   Multiple Vitamin (MULTIVITAMIN) tablet Take 1 tablet by mouth 2 (two) times daily.     tirzepatide  5 MG/0.5ML injection vial Inject 5 mg into the skin once a week. 6 mL 0   meclizine  (ANTIVERT ) 25 MG tablet TAKE 1 TABLET TWICE A DAY AS NEEDED FOR DIZZINESS 60 tablet 3   zinc  gluconate ([employer] ZINC  GLUCONATE) 50 MG tablet Take 1 tablet (50 mg total) by mouth daily. 90 tablet 1    phentermine  30 MG capsule TAKE 1 CAPSULE BY MOUTH EVERY DAY IN THE MORNING 30 capsule 0   Facility-Administered Medications Prior to Visit  Medication Dose Route Frequency Provider Last Rate Last Admin   denosumab  (PROLIA ) injection 60 mg  60 mg Subcutaneous Once         ROS Review of Systems  Constitutional: Negative.  Negative for appetite change, chills, diaphoresis, fatigue and fever.  HENT: Negative.  Negative for trouble swallowing.   Respiratory: Negative.  Negative for cough, chest tightness, shortness of breath and wheezing.   Cardiovascular:  Negative for chest pain, palpitations and leg swelling.  Gastrointestinal:  Negative for abdominal pain, constipation, diarrhea, nausea and vomiting.  Genitourinary: Negative.  Negative for difficulty urinating.  Musculoskeletal: Negative.  Negative for arthralgias, joint swelling and myalgias.  Skin: Negative.   Neurological:  Positive for dizziness and light-headedness.  Hematological:  Negative for adenopathy. Does not bruise/bleed easily.  Psychiatric/Behavioral: Negative.      Objective:  BP 110/82 (BP Location: Left Arm, Patient Position: Sitting, Cuff Size: Normal)   Pulse 72   Temp 97.9 F (36.6 C) (Oral)   Ht 5' 11 (1.803 m)   Wt 194 lb 9.6 oz (88.3 kg)   SpO2 94%   BMI 27.14 kg/m   BP Readings from Last 3 Encounters:  09/05/24 110/82  01/05/24 122/78  09/29/23 110/80    Wt Readings from Last 3 Encounters:  09/05/24 194 lb 9.6 oz (88.3  kg)  01/05/24 212 lb 9.6 oz (96.4 kg)  09/29/23 209 lb (94.8 kg)    Physical Exam Vitals reviewed.  Constitutional:      Appearance: Normal appearance.  HENT:     Mouth/Throat:     Mouth: Mucous membranes are moist.  Eyes:     General: No scleral icterus.    Conjunctiva/sclera: Conjunctivae normal.  Cardiovascular:     Rate and Rhythm: Normal rate and regular rhythm.     Heart sounds: No murmur heard.    No friction rub. No gallop.     Comments: EKG--- NSR, 69 bpm No  LVH, Q waves, or ST/T wave changes  Pulmonary:     Effort: Pulmonary effort is normal.     Breath sounds: No stridor. No wheezing, rhonchi or rales.  Abdominal:     General: Abdomen is flat.     Palpations: There is no mass.     Tenderness: There is no abdominal tenderness. There is no guarding.     Hernia: No hernia is present.  Musculoskeletal:        General: Normal range of motion.     Cervical back: Neck supple.     Right lower leg: No edema.     Left lower leg: No edema.  Lymphadenopathy:     Cervical: No cervical adenopathy.  Skin:    General: Skin is warm and dry.     Coloration: Skin is not jaundiced or pale.  Neurological:     General: No focal deficit present.     Mental Status: She is alert. Mental status is at baseline.  Psychiatric:        Mood and Affect: Mood normal.        Behavior: Behavior normal.     Lab Results  Component Value Date   WBC 9.1 09/05/2024   HGB 12.6 09/05/2024   HCT 37.7 09/05/2024   PLT 383.0 09/05/2024   GLUCOSE 75 09/05/2024   CHOL 137 09/05/2024   TRIG 56.0 09/05/2024   HDL 49.30 09/05/2024   LDLCALC 76 09/05/2024   ALT 17 09/05/2024   AST 18 09/05/2024   NA 141 09/05/2024   K 4.1 09/05/2024   CL 107 09/05/2024   CREATININE 0.70 09/05/2024   BUN 16 09/05/2024   CO2 28 09/05/2024   TSH 0.60 09/05/2024   INR 1.20 03/07/2016   HGBA1C 5.1 01/15/2021    CT Chest Wo Contrast Result Date: 11/03/2023 CLINICAL DATA:  Follow-up pulmonary nodule EXAM: CT CHEST WITHOUT CONTRAST TECHNIQUE: Multidetector CT imaging of the chest was performed following the standard protocol without IV contrast. RADIATION DOSE REDUCTION: This exam was performed according to the departmental dose-optimization program which includes automated exposure control, adjustment of the mA and/or kV according to patient size and/or use of iterative reconstruction technique. COMPARISON:  CT chest October 15, 2022 FINDINGS: Cardiovascular: No significant vascular  findings. Normal heart size. No pericardial effusion. No significant coronary artery calcifications Mediastinum/Nodes: No enlarged mediastinal or axillary lymph nodes. Thyroid  gland, trachea, and esophagus demonstrate no significant findings. Lungs/Pleura: Lungs are clear. No pleural effusion or pneumothorax. No change in the semi-solid 7 mm right lower lobe nodule image 101. No other pulmonary nodules. Upper Abdomen: Postsurgical changes of the stomach, may be related to bariatric surgical procedure. Prior cholecystectomy. Musculoskeletal: No chest wall mass or suspicious bone lesions identified. IMPRESSION: *No change in the semi-solid 7 mm right lower lobe nodule. No other pulmonary nodules. Lung-RADS 2, benign appearance or behavior. Continue annual screening  with low-dose chest CT without contrast in 12 months. *No acute cardiopulmonary process. *Postsurgical changes of the stomach, may be related to bariatric surgical procedure. Prior cholecystectomy. Electronically Signed   By: Franky Chard M.D.   On: 11/03/2023 13:48   The 10-year ASCVD risk score (Arnett DK, et al., 2019) is: 1.4%   Values used to calculate the score:     Age: 68 years     Clinically relevant sex: Female     Is Non-Hispanic African American: No     Diabetic: No     Tobacco smoker: No     Systolic Blood Pressure: 110 mmHg     Is BP treated: No     HDL Cholesterol: 49.3 mg/dL     Total Cholesterol: 137 mg/dL   Assessment & Plan:  Hyperparathyroidism- Calcium is normal. -     Basic metabolic panel with GFR; Future -     Phosphorus; Future -     VITAMIN D  25 Hydroxy (Vit-D Deficiency, Fractures); Future -     PTH, intact and calcium; Future  Folate deficiency -     CBC with Differential/Platelet; Future  Vitamin B12 deficiency anemia due to intrinsic factor deficiency -     CBC with Differential/Platelet; Future  Chronic zinc  deficiency -     Zinc  Gluconate; Take 1 tablet (50 mg total) by mouth daily.  Dispense: 90  tablet; Refill: 1  Thrombocytosis- Plts are normal now. -     IBC + Ferritin; Future -     CBC with Differential/Platelet; Future -     Hepatic function panel; Future  Localized osteoporosis with current pathological fracture, sequela -     Basic metabolic panel with GFR; Future -     Phosphorus; Future -     VITAMIN D  25 Hydroxy (Vit-D Deficiency, Fractures); Future -     PTH, intact and calcium; Future -     Denosumab   Encounter for general adult medical examination with abnormal findings- Exam completed, labs reviewed, vaccines reviewed and updated, cancer screenings are UTD, pt ed material was given.  -     Lipid panel; Future  Idiopathic hypotension- Labs and EKG are normal. -     Cortisol; Future -     TSH; Future -     EKG 12-Lead  Thiamine  deficiency -     Vitamin B1; Future  Need for immunization against influenza -     Flu vaccine trivalent PF, 6mos and older(Flulaval,Afluria,Fluarix,Fluzone)  Immunization due -     Pneumococcal conjugate vaccine 20-valent     Follow-up: Return in about 6 months (around 03/05/2025).  Debby Molt, MD "

## 2024-09-05 NOTE — Patient Instructions (Signed)

## 2024-09-07 ENCOUNTER — Encounter: Payer: Self-pay | Admitting: Hematology

## 2024-09-07 ENCOUNTER — Other Ambulatory Visit (HOSPITAL_COMMUNITY): Payer: Self-pay

## 2024-09-07 ENCOUNTER — Telehealth: Payer: Self-pay

## 2024-09-07 NOTE — Telephone Encounter (Signed)
 Prolia  VOB initiated via [email or web address]  Next Prolia  inj DUE: NEW START

## 2024-09-09 LAB — EXTRA SPECIMEN

## 2024-09-09 LAB — PTH, INTACT AND CALCIUM: PTH: 79 pg/mL — ABNORMAL HIGH (ref 16–77)

## 2024-09-09 LAB — VITAMIN B1: Vitamin B1 (Thiamine): 38 nmol/L — ABNORMAL HIGH (ref 8–30)

## 2024-09-18 ENCOUNTER — Other Ambulatory Visit (HOSPITAL_COMMUNITY): Payer: Self-pay

## 2024-09-18 NOTE — Telephone Encounter (Signed)
" ° °  New insurance is needed to proceed.  "

## 2024-09-19 NOTE — Telephone Encounter (Signed)
 Unable to reach patient left a message for the patient to reach out to us  and update her insurance.

## 2024-09-21 ENCOUNTER — Other Ambulatory Visit (HOSPITAL_COMMUNITY): Payer: Self-pay

## 2024-09-21 NOTE — Telephone Encounter (Signed)
 Unable to reach patient. Left message to call the office and update her insurance.

## 2024-09-24 ENCOUNTER — Encounter: Payer: Self-pay | Admitting: Hematology

## 2024-09-24 ENCOUNTER — Telehealth: Payer: Self-pay

## 2024-09-24 NOTE — Telephone Encounter (Signed)
 Copied from CRM [id number]. Topic: General - Other >> Sep 24, 2024 12:09 PM Laymon HERO wrote: Reason for CRM: patient returning call to update insurance. Updated to [facility]

## 2024-10-02 ENCOUNTER — Encounter: Admitting: Internal Medicine

## 2024-10-10 NOTE — Telephone Encounter (Signed)
 Prolia  VOB initiated via [email or web address]  Next Prolia  inj DUE: NEW START

## 2024-10-11 ENCOUNTER — Other Ambulatory Visit (HOSPITAL_COMMUNITY): Payer: Self-pay

## 2024-10-11 NOTE — Telephone Encounter (Signed)
 SABRA

## 2024-10-11 NOTE — Telephone Encounter (Signed)
 Pt ready for scheduling for PROLIA  on or after : 10/11/24  Option# 1: Buy/Bill (Office supplied medication)  Out-of-pocket cost due at time of clinic visit: $50  Number of injection/visits approved: ---  Primary: [employer] Prolia  co-insurance: $25 Admin fee co-insurance: $25  Secondary: --- Prolia  co-insurance:  Admin fee co-insurance:   Medical Benefit Details: Date Benefits were checked: 10/11/24 Deductible: NO/ Coinsurance: $25/ Admin Fee: $25  Prior Auth: N/A PA# Expiration Date:   # of doses approved: ----------------------------------------------------------------------- Option# 2- Med Obtained from pharmacy:  Pharmacy benefit: Copay $MUST FILL AT SPECIALTY PHARM[facility] (Paid to pharmacy) Admin Fee: $25 (Pay at clinic)  Prior Auth: N/A PA# Expiration Date:   # of doses approved:   If patient wants fill through the pharmacy benefit please send prescription to: [facility], and include estimated need by date in rx notes. Pharmacy will ship medication directly to the office.  Patient IS eligible for Prolia  Copay Card. Copay Card can make patient's cost as little as $25. Link to apply: [email or web address]  ** This summary of benefits is an estimation of the patient's out-of-pocket cost. Exact cost may very based on individual plan coverage.
